# Patient Record
Sex: Male | Born: 1980
Health system: Southern US, Community
[De-identification: ages and names within clinical notes are randomized; demographics above are authoritative.]

## PROBLEM LIST (undated history)

## (undated) DIAGNOSIS — I10 Essential (primary) hypertension: Secondary | ICD-10-CM

## (undated) DIAGNOSIS — F1010 Alcohol abuse, uncomplicated: Secondary | ICD-10-CM

## (undated) DIAGNOSIS — F1410 Cocaine abuse, uncomplicated: Secondary | ICD-10-CM

## (undated) NOTE — *Deleted (*Deleted)
PROGRESS NOTE    Vincent Peters  ZOX:[id number] DOB: 04-Jan-1981 DOA: 06/03/2020 PCP: Patient, No Pcp Per (Confirm with patient/family/NH records and if not entered, this HAS to be entered at [facility] point of entry. "No PCP" if truly none.)   Chief Complaint  Patient presents with  . Alcohol Problem    Brief Narrative: (Start on day 1 of progress note - keep it brief and live) ***   Assessment & Plan:   Active Problems:   Delirium tremens (HCC)   SIRS (systemic inflammatory response syndrome) (HCC)   ***   DVT prophylaxis: (Lovenox/Heparin/SCD's/anticoagulated/None (if comfort care) Code Status: (Full/Partial - specify details) Family Communication: (Specify name, relationship & date discussed. NO "discussed with patient") Disposition:   Status is: Inpatient  {Inpatient:23812}  Dispo: The patient is from: {From:23814}              Anticipated d/c is to: {To:23815}              Anticipated d/c date is: {Days:23816}              Patient currently {Medically stable:23817}       Consultants:   ***  Procedures: (Don't include imaging studies which can be auto populated. Include things that cannot be auto populated i.e. Echo, Carotid and venous dopplers, Foley, Bipap, HD, tubes/drains, wound vac, central lines etc)  ***  Antimicrobials: (specify start and planned stop date. Auto populated tables are space occupying and do not give end dates)  ***    Subjective: ***  Objective: Vitals:   06/11/20 1305 06/11/20 2040 06/12/20 0654 06/12/20 1319  BP: 125/80 138/89 115/77 (!) 137/95  Pulse: 62 68 71 78  Resp: 18 18 18 11   Temp: 98.3 F (36.8 C) 98.9 F (37.2 C) 98.3 F (36.8 C) 98.5 F (36.9 C)  TempSrc: Oral Oral Oral Oral  SpO2: 100% 100% 100% 100%  Weight:   63.7 kg   Height:        Intake/Output Summary (Last 24 hours) at 06/12/2020 1326 Last data filed at 06/12/2020 1002 Gross per 24 hour  Intake -  Output 3176 ml  Net -3176 ml    Filed Weights   06/08/20 0500 06/11/20 0500 06/12/20 0654  Weight: 63.8 kg 64.7 kg 63.7 kg    Examination:  General exam: Appears calm and comfortable  Respiratory system: Clear to auscultation. Respiratory effort normal. Cardiovascular system: S1 & S2 heard, RRR. No JVD, murmurs, rubs, gallops or clicks. No pedal edema. Gastrointestinal system: Abdomen is nondistended, soft and nontender. No organomegaly or masses felt. Normal bowel sounds heard. Central nervous system: Alert and oriented. No focal neurological deficits. Extremities: Symmetric 5 x 5 power. Skin: No rashes, lesions or ulcers Psychiatry: Judgement and insight appear normal. Mood & affect appropriate.     Data Reviewed: I have personally reviewed following labs and imaging studies  CBC: Recent Labs  Lab 06/08/20 0246 06/09/20 1132 06/10/20 0544 06/11/20 0435 06/12/20 0506  WBC 8.2 8.6 9.4 9.6 12.0*  NEUTROABS 4.3 4.1 4.6 4.9 6.5  HGB 13.8 13.2 13.4 13.1 13.4  HCT 41.5 40.4 40.1 39.7 40.8  MCV 99.5 100.0 101.0* 101.3* 100.2*  PLT 125* 150 183 186 264    Basic Metabolic Panel: Recent Labs  Lab 06/08/20 0246 06/09/20 1132 06/10/20 0544 06/11/20 0435 06/12/20 0506  NA 138 136 137 136 135  K 3.4* 3.5 3.4* 4.3 3.7  CL 102 101 100 103 101  CO2 24 26 29 25 25   GLUCOSE  131* 106* 110* 104* 99  BUN 8 <5* <5* <5* 7  CREATININE 0.43* 0.48* 0.55* 0.47* 0.63  CALCIUM 8.9 8.4* 8.7* 9.1 9.1  MG 1.9 1.6* 1.8 1.9 1.8  PHOS 4.6 3.4 3.0 3.4 4.8*    GFR: Estimated Creatinine Clearance: 113.9 mL/min (by C-G formula based on SCr of 0.63 mg/dL).  Liver Function Tests: Recent Labs  Lab 06/08/20 0246 06/09/20 1132 06/10/20 0544 06/11/20 0435 06/12/20 0506  AST 212* 120* 96* 70* 78*  ALT 159* 142* 121* 108* 102*  ALKPHOS 89 80 85 83 76  BILITOT 1.0 0.6 0.5 0.7 0.6  PROT 7.2 6.5 6.6 6.9 7.1  ALBUMIN 3.2* 2.8* 2.9* 3.0* 3.1*    CBG: No results for input(s): GLUCAP in the last 168 hours.   Recent  Results (from the past 240 hour(s))  Respiratory Panel by RT PCR (Flu A&B, Covid) -     Status: None   Collection Time: 06/03/20  9:45 PM  Result Value Ref Range Status   SARS Coronavirus 2 by RT PCR NEGATIVE NEGATIVE Final    Comment: (NOTE) SARS-CoV-2 target nucleic acids are NOT DETECTED.  The SARS-CoV-2 RNA is generally detectable in upper respiratoy specimens during the acute phase of infection. The lowest concentration of SARS-CoV-2 viral copies this assay can detect is 131 copies/mL. A negative result does not preclude SARS-Cov-2 infection and should not be used as the sole basis for treatment or other patient management decisions. A negative result may occur with  improper specimen collection/handling, submission of specimen other than nasopharyngeal swab, presence of viral mutation(s) within the areas targeted by this assay, and inadequate number of viral copies (<131 copies/mL). A negative result must be combined with clinical observations, patient history, and epidemiological information. The expected result is Negative.  Fact Sheet for Patients:  https://www.moore.com/  Fact Sheet for Healthcare Providers:  https://www.young.biz/  This test is no t yet approved or cleared by the [place] FDA and  has been authorized for detection and/or diagnosis of SARS-CoV-2 by FDA under an Emergency Use Authorization (EUA). This EUA will remain  in effect (meaning this test can be used) for the duration of the COVID-19 declaration under Section 564(b)(1) of the Act, 21 U.S.C. section 360bbb-3(b)(1), unless the authorization is terminated or revoked sooner.     Influenza A by PCR NEGATIVE NEGATIVE Final   Influenza B by PCR NEGATIVE NEGATIVE Final    Comment: (NOTE) The Xpert Xpress SARS-CoV-2/FLU/RSV assay is intended as an aid in  the diagnosis of influenza from Nasopharyngeal swab specimens and  should not be used as a sole basis for  treatment. Nasal washings and  aspirates are unacceptable for Xpert Xpress SARS-CoV-2/FLU/RSV  testing.  Fact Sheet for Patients: https://www.moore.com/  Fact Sheet for Healthcare Providers: https://www.young.biz/  This test is not yet approved or cleared by the [place] FDA and  has been authorized for detection and/or diagnosis of SARS-CoV-2 by  FDA under an Emergency Use Authorization (EUA). This EUA will remain  in effect (meaning this test can be used) for the duration of the  Covid-19 declaration under Section 564(b)(1) of the Act, 21  U.S.C. section 360bbb-3(b)(1), unless the authorization is  terminated or revoked. Performed at [facility], [address] [place], [place] [address]   MRSA PCR Screening     Status: None   Collection Time: 06/04/20 10:59 PM   Specimen: Nasal Mucosa; Nasopharyngeal  Result Value Ref Range Status   MRSA by PCR NEGATIVE NEGATIVE  Final    Comment:        The GeneXpert MRSA Assay (FDA approved for NASAL specimens only), is one component of a comprehensive MRSA colonization surveillance program. It is not intended to diagnose MRSA infection nor to guide or monitor treatment for MRSA infections. Performed at [facility], [address] [place], [place] [address]   Culture, Urine     Status: Abnormal   Collection Time: 06/07/20  8:18 AM   Specimen: Urine, Clean Catch  Result Value Ref Range Status   Specimen Description   Final    URINE, CLEAN CATCH Performed at [facility], [address] [place], [place] [address]    Special Requests   Final    NONE Performed at [facility], [address] [place], [place] [address]    Culture (A)  Final    <10,000 COLONIES/mL INSIGNIFICANT GROWTH Performed at [facility], [address], [place], [place] [address]    Report Status 06/08/2020 FINAL  Final          Radiology Studies: No results found.      Scheduled Meds: . apixaban  5 mg Oral BID  . Chlorhexidine Gluconate Cloth  6 each Topical Daily  . folic acid  1 mg Oral Daily  . lactulose  20 g Oral Daily  . mouth rinse  15 mL Mouth Rinse BID  . metoprolol  100 mg Oral Daily  . multivitamin with minerals  1 tablet Oral Daily  . pantoprazole  40 mg Oral Daily  . PHENObarbital  65 mg Intravenous TID  . sacubitril-valsartan  1 tablet Oral BID  . thiamine  100 mg Oral Daily   Continuous Infusions:   LOS: 9 days    Time spent: ***    Ramiro Harvest, MD [facility]   To contact the attending provider between 7A-7P or the covering provider during after hours 7P-7A, please log into the web site [email or web address] and access using universal [place] password for that web site. If you do not have the password, please call the hospital operator.  06/12/2020, 1:26 PM

---

## 2008-08-30 ENCOUNTER — Emergency Department (HOSPITAL_COMMUNITY): Admission: EM | Admit: 2008-08-30 | Discharge: 2008-08-31 | Payer: Self-pay | Admitting: Emergency Medicine
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
## 2014-01-16 ENCOUNTER — Encounter (HOSPITAL_COMMUNITY): Payer: Self-pay | Admitting: Emergency Medicine

## 2014-01-16 DIAGNOSIS — R071 Chest pain on breathing: Secondary | ICD-10-CM | POA: Insufficient documentation

## 2014-01-16 DIAGNOSIS — R7989 Other specified abnormal findings of blood chemistry: Secondary | ICD-10-CM | POA: Insufficient documentation

## 2014-01-16 LAB — CBC WITH DIFFERENTIAL/PLATELET
BASOS ABS: 0 10*3/uL (ref 0.0–0.1)
BASOS PCT: 0 % (ref 0–1)
EOS PCT: 2 % (ref 0–5)
Eosinophils Absolute: 0.1 10*3/uL (ref 0.0–0.7)
HEMATOCRIT: 43.8 % (ref 39.0–52.0)
Hemoglobin: 15.5 g/dL (ref 13.0–17.0)
Lymphocytes Relative: 24 % (ref 12–46)
Lymphs Abs: 1.7 10*3/uL (ref 0.7–4.0)
MCH: 32.4 pg (ref 26.0–34.0)
MCHC: 35.4 g/dL (ref 30.0–36.0)
MCV: 91.4 fL (ref 78.0–100.0)
MONO ABS: 1.2 10*3/uL — AB (ref 0.1–1.0)
Monocytes Relative: 18 % — ABNORMAL HIGH (ref 3–12)
NEUTROS ABS: 3.8 10*3/uL (ref 1.7–7.7)
Neutrophils Relative %: 56 % (ref 43–77)
PLATELETS: 208 10*3/uL (ref 150–400)
RBC: 4.79 MIL/uL (ref 4.22–5.81)
RDW: 12 % (ref 11.5–15.5)
WBC: 6.9 10*3/uL (ref 4.0–10.5)

## 2014-01-16 NOTE — ED Notes (Signed)
Pt from home. C/o left lower flank pain x 15 days and urinary retention. Denies N/V/D. Pt self medicating with ETOH. ETOH on board at this time.

## 2014-01-17 ENCOUNTER — Emergency Department (HOSPITAL_COMMUNITY)
Admission: EM | Admit: 2014-01-17 | Discharge: 2014-01-17 | Disposition: A | Discharge status: Home / Self Care | Payer: Self-pay | Attending: Emergency Medicine | Admitting: Emergency Medicine

## 2014-01-17 ENCOUNTER — Emergency Department (HOSPITAL_COMMUNITY): Payer: Self-pay

## 2014-01-17 DIAGNOSIS — R945 Abnormal results of liver function studies: Secondary | ICD-10-CM

## 2014-01-17 DIAGNOSIS — R7989 Other specified abnormal findings of blood chemistry: Secondary | ICD-10-CM

## 2014-01-17 DIAGNOSIS — R0789 Other chest pain: Secondary | ICD-10-CM

## 2014-01-17 LAB — URINALYSIS, ROUTINE W REFLEX MICROSCOPIC
Bilirubin Urine: NEGATIVE
GLUCOSE, UA: NEGATIVE mg/dL
Ketones, ur: NEGATIVE mg/dL
LEUKOCYTES UA: NEGATIVE
Nitrite: NEGATIVE
PH: 5.5 (ref 5.0–8.0)
Protein, ur: 100 mg/dL — AB
SPECIFIC GRAVITY, URINE: 1.016 (ref 1.005–1.030)
Urobilinogen, UA: 0.2 mg/dL (ref 0.0–1.0)

## 2014-01-17 LAB — URINE MICROSCOPIC-ADD ON

## 2014-01-17 LAB — COMPREHENSIVE METABOLIC PANEL
ALBUMIN: 3.6 g/dL (ref 3.5–5.2)
ALT: 194 U/L — AB (ref 0–53)
AST: 247 U/L — AB (ref 0–37)
Alkaline Phosphatase: 81 U/L (ref 39–117)
BUN: 5 mg/dL — ABNORMAL LOW (ref 6–23)
CALCIUM: 9 mg/dL (ref 8.4–10.5)
CO2: 24 mEq/L (ref 19–32)
CREATININE: 0.5 mg/dL (ref 0.50–1.35)
Chloride: 98 mEq/L (ref 96–112)
GFR calc Af Amer: >90 mL/min (ref >90)
GFR calc non Af Amer: >90 mL/min (ref >90)
Glucose, Bld: 151 mg/dL — ABNORMAL HIGH (ref 70–99)
Potassium: 3.6 mEq/L — ABNORMAL LOW (ref 3.7–5.3)
SODIUM: 141 meq/L (ref 137–147)
Total Bilirubin: 0.3 mg/dL (ref 0.3–1.2)
Total Protein: 9.1 g/dL — ABNORMAL HIGH (ref 6.0–8.3)

## 2014-01-17 LAB — LIPASE, BLOOD: Lipase: 53 U/L (ref 11–59)

## 2014-01-17 MED ORDER — KETOROLAC TROMETHAMINE 60 MG/2ML IM SOLN
60.0000 mg | Freq: Once | INTRAMUSCULAR | Status: AC
  Administered 2014-01-17: 60 mg via INTRAMUSCULAR
  Filled 2014-01-17: qty 2

## 2014-01-17 MED ORDER — IBUPROFEN 600 MG PO TABS: 600.0000 mg | ORAL_TABLET | Freq: Four times a day (QID) | ORAL | Status: DC | PRN

## 2014-01-17 NOTE — ED Notes (Signed)
Pt discharged home with all belongings, pt alert, oriented, and ambulatory upon discharge. Pt refused wheelchair assistance. 1 new RX prescribed, pt verbalizes understanding of discharge instructions, pt drove self home, no narcotics given in ED

## 2014-01-17 NOTE — Discharge Instructions (Signed)

## 2014-01-17 NOTE — ED Provider Notes (Signed)
CSN: [id number]     Arrival date & time 01/16/14  2233 History   First MD Initiated Contact with Patient 01/17/14 0126     Chief Complaint  Patient presents with  . Abdominal Pain  . Nausea     (Consider location/radiation/quality/duration/timing/severity/associated sxs/prior Treatment) HPI Patient presents with left sided chest and upper abdomen pain starting around 2 PM this afternoon. Patient states he works [employer]. The pain is now improved. It is worse with palpation and movement. He denied any direct,. He has no shortness of breath. Patient admits to drinking alcohol excessively recently. History reviewed. No pertinent past medical history. History reviewed. No pertinent past surgical history. History reviewed. No pertinent family history. History  Substance Use Topics  . Smoking status: Never Smoker   . Smokeless tobacco: Not on file  . Alcohol Use: Yes    Review of Systems  Constitutional: Negative for fever and chills.  Respiratory: Negative for shortness of breath.   Cardiovascular: Positive for chest pain. Negative for palpitations and leg swelling.  Gastrointestinal: Positive for abdominal pain. Negative for nausea, vomiting, diarrhea and constipation.  Genitourinary: Negative for dysuria and flank pain.  Musculoskeletal: Negative for back pain, neck pain and neck stiffness.  Skin: Negative for rash and wound.  Neurological: Negative for dizziness, weakness, light-headedness, numbness and headaches.  All other systems reviewed and are negative.     Allergies  Review of patient's allergies indicates no known allergies.  Home Medications   Prior to Admission medications   Not on File   BP 156/94  Pulse 90  Temp(Src) 98.2 F (36.8 C) (Oral)  Resp 26  SpO2 94% Physical Exam  Nursing note and vitals reviewed. Constitutional: He is oriented to person, place, and time. He appears well-developed and well-nourished. No distress.  HENT:  Head:  Normocephalic and atraumatic.  Mouth/Throat: Oropharynx is clear and moist.  Eyes: EOM are normal. Pupils are equal, round, and reactive to light.  Neck: Normal range of motion. Neck supple.  Cardiovascular: Normal rate and regular rhythm.   Pulmonary/Chest: Effort normal and breath sounds normal. No respiratory distress. He has no wheezes. He has no rales. He exhibits tenderness (left lateral chest tenderness with palpation. No crepitance or deformity).  Abdominal: Soft. Bowel sounds are normal. He exhibits no distension and no mass. There is no tenderness. There is no rebound and no guarding.  Musculoskeletal: Normal range of motion. He exhibits no edema and no tenderness.  No CVA tenderness bilaterally.  Neurological: He is alert and oriented to person, place, and time.  Skin: Skin is warm and dry. No rash noted. No erythema.  Psychiatric: He has a normal mood and affect. His behavior is normal.    ED Course  Procedures (including critical care time) Labs Review Labs Reviewed  CBC WITH DIFFERENTIAL - Abnormal; Notable for the following:    Monocytes Relative 18 (*)    Monocytes Absolute 1.2 (*)    All other components within normal limits  COMPREHENSIVE METABOLIC PANEL - Abnormal; Notable for the following:    Potassium 3.6 (*)    Glucose, Bld 151 (*)    BUN 5 (*)    Total Protein 9.1 (*)    AST 247 (*)    ALT 194 (*)    All other components within normal limits  LIPASE, BLOOD  URINALYSIS, ROUTINE W REFLEX MICROSCOPIC    Imaging Review No results found.   EKG Interpretation   Date/Time:  Tuesday January 17 2014 02:25:37 EDT Ventricular Rate:  87 PR Interval:  138 QRS Duration: 88 QT Interval:  368 QTC Calculation: 443 R Axis:   67 Text Interpretation:  Sinus rhythm Borderline repolarization abnormality  Confirmed by Ranae PalmsYELVERTON  MD, Asani Deniston ([id number]) on 01/17/2014 6:47:10 AM      MDM   Final diagnoses:  None   Patient's symptoms have improved with Toradol. Exam is  consistent with chest wall pain which is likely musculoskeletal in nature. Patient is aware of his elevated liver function tests and the need to decrease his alcohol intake. I've advised him to followup with a gastroenterologist to have these rechecked. Return precautions have been given.    Loren Raceravid Rito Lecomte, MD 01/17/14 [phone]

## 2014-01-17 NOTE — ED Notes (Signed)
Pt states he is here for his job. When asked to clarify pt states "it got too hot today, I couldn't work." Pt denies problems with urinating. Pt does report Left side pain and generalized abd pain, pt states "I think my belly hurts because I drink too much." Pt reports drinking beer for 20 years, approx 12 beers a day, pt last drank last night at 2000, pt admits to drinking 12 beers prior to arrival here. Pt states "everything makes my pain better."

## 2014-01-17 NOTE — ED Notes (Signed)
Patient transported to X-ray 

## 2014-01-17 NOTE — ED Notes (Signed)
MD Yelverton at bedside. 

## 2014-02-08 ENCOUNTER — Emergency Department (HOSPITAL_COMMUNITY)
Admission: EM | Admit: 2014-02-08 | Discharge: 2014-02-09 | Disposition: A | Discharge status: Home / Self Care | Payer: Self-pay | Attending: Emergency Medicine | Admitting: Emergency Medicine

## 2014-02-08 ENCOUNTER — Encounter (HOSPITAL_COMMUNITY): Payer: Self-pay | Admitting: Emergency Medicine

## 2014-02-08 DIAGNOSIS — M6281 Muscle weakness (generalized): Secondary | ICD-10-CM | POA: Insufficient documentation

## 2014-02-08 DIAGNOSIS — R531 Weakness: Secondary | ICD-10-CM

## 2014-02-08 DIAGNOSIS — F17200 Nicotine dependence, unspecified, uncomplicated: Secondary | ICD-10-CM | POA: Insufficient documentation

## 2014-02-08 DIAGNOSIS — F1010 Alcohol abuse, uncomplicated: Secondary | ICD-10-CM | POA: Insufficient documentation

## 2014-02-08 HISTORY — DX: Alcohol abuse, uncomplicated: F10.10

## 2014-02-08 HISTORY — DX: Cocaine abuse, uncomplicated: F14.10

## 2014-02-08 LAB — CBC WITH DIFFERENTIAL/PLATELET
BASOS ABS: 0 10*3/uL (ref 0.0–0.1)
BASOS PCT: 0 % (ref 0–1)
Eosinophils Absolute: 0.1 10*3/uL (ref 0.0–0.7)
Eosinophils Relative: 1 % (ref 0–5)
HCT: 42.7 % (ref 39.0–52.0)
Hemoglobin: 15.2 g/dL (ref 13.0–17.0)
Lymphocytes Relative: 7 % — ABNORMAL LOW (ref 12–46)
Lymphs Abs: 0.7 10*3/uL (ref 0.7–4.0)
MCH: 32.7 pg (ref 26.0–34.0)
MCHC: 35.6 g/dL (ref 30.0–36.0)
MCV: 91.8 fL (ref 78.0–100.0)
Monocytes Absolute: 1.2 10*3/uL — ABNORMAL HIGH (ref 0.1–1.0)
Monocytes Relative: 13 % — ABNORMAL HIGH (ref 3–12)
NEUTROS ABS: 7.6 10*3/uL (ref 1.7–7.7)
NEUTROS PCT: 79 % — AB (ref 43–77)
Platelets: 94 10*3/uL — ABNORMAL LOW (ref 150–400)
RBC: 4.65 MIL/uL (ref 4.22–5.81)
RDW: 12.3 % (ref 11.5–15.5)
WBC: 9.6 10*3/uL (ref 4.0–10.5)

## 2014-02-08 LAB — BASIC METABOLIC PANEL
Anion gap: 19 — ABNORMAL HIGH (ref 5–15)
BUN: 13 mg/dL (ref 6–23)
CHLORIDE: 95 meq/L — AB (ref 96–112)
CO2: 22 mEq/L (ref 19–32)
Calcium: 9.9 mg/dL (ref 8.4–10.5)
Creatinine, Ser: 0.48 mg/dL — ABNORMAL LOW (ref 0.50–1.35)
GFR calc non Af Amer: >90 mL/min (ref >90)
Glucose, Bld: 115 mg/dL — ABNORMAL HIGH (ref 70–99)
POTASSIUM: 4 meq/L (ref 3.7–5.3)
Sodium: 136 mEq/L — ABNORMAL LOW (ref 137–147)

## 2014-02-08 LAB — RAPID URINE DRUG SCREEN, HOSP PERFORMED
Amphetamines: NOT DETECTED
Barbiturates: NOT DETECTED
Benzodiazepines: NOT DETECTED
Cocaine: NOT DETECTED
OPIATES: NOT DETECTED
TETRAHYDROCANNABINOL: NOT DETECTED

## 2014-02-08 LAB — I-STAT TROPONIN, ED: Troponin i, poc: 0.01 ng/mL (ref 0.00–0.08)

## 2014-02-08 LAB — LIPASE, BLOOD: Lipase: 52 U/L (ref 11–59)

## 2014-02-08 LAB — ETHANOL

## 2014-02-08 NOTE — Discharge Instructions (Signed)
Trastorno por abuso de alcohol  (Alcohol Use Disorder) El trastorno por abuso de alcohol es un trastorno mental. No se trata de un incidente nico en el que se bebe en abundancia. Este trastorno se debe al consumo incontrolable del alcohol a lo largo del [place], que causa dificultades en una o ms reas de la vida diaria. Al consumir en exceso, las personas que sufren este trastorno estn en riesgo de daarse a s mismos y a Dentist. Tambin puede causar otros trastornos [place], como trastornos del [place] de nimo y trastornos de [place], y problemas fsicos graves. Las personas que sufren trastornos por consumo de alcohol, generalmente abusan de otras sustancias.  Los trastornos por consumo de alcohol son frecuentes y se han generalizado. Algunas personas beben alcohol para poder enfrentarse o escapar a las situaciones negativas de la vida. Otros beben para Government social research officer crnico o los sntomas de una enfermedad mental. Las personas con historia familiar de trastornos por consumo de alcohol tienen ms riesgo de perder el control y de consumir alcohol en exceso.  SNTOMAS  Los signos y sntomas pueden ser:   Consumo dealcohol engrandes cantidades o durante perodos ms prolongados que lo previsto.  Mltiples intentos fallidos de dejar de bebero controlar el consumo de alcohol.   Emplear gran cantidad de tiempo para obtener, consumir o recuperarse de los efectos del alcohol (resaca).  Un deseo intenso o necesidad de consumir alcohol (abstinencia).   Continuar consumiendo alcohol a pesar de los Murphy Oil, en la escuela o en el hogar debido al consumo.   Continuar consumiendo a Scientist, water quality de American Standard Companies de relacin debido al consumo.  Continuar consumiendo an en situaciones que impliquen un peligro fsico, como al conducir un vehculo.  Continuar consumiendo a pesar de saber que un problema fsico o psicolgico probablemente se relaciones con el consumo de alcohol. Los  problemas fsicos relacionados con el consumo de alcohol pueden producirse en el cerebro, el corazn, el hgado, el estmago y los intestinos. Los problemas psicolgicos relacionados con el consumo de alcohol incluyen intoxicacin, depresin, ansiedad, psicosis, delirio y Photographer.   La necesidad de aumentar la cantidad de alcohol para Wellsite geologist deseado, o una disminucin del efecto al consumir la misma cantidad de alcohol (tolerancia).  Sndrome de abstinencia al reducir o Photographer consumo, o tener que consumir alcohol para reducir o Multimedia programmer sndrome de abstinencia. Los sntomas de la abstinencia incluyen:  Frecuencia cardaca acelerada.  Temblores en las manos.  Dificultad para dormir.  Nuseas.  Vmitos.  Alucinaciones.  Agitacin.  Convulsiones. DIAGNSTICO  El mdico es el encargado de diagnosticar el trastorno por consumo de alcohol. Comenzar hacindole tres o cuatro preguntas para evaluar si consume alcohol en forma excesiva o si representa un problema. Para confirmar el diagnstico de trastorno por consumo de alcohol, deben estar presentes al [employer] sntomas (vea SNTOMAS) durante un perodo de 12 meses. La gravedad del trastorno depende del nmero de sntomas:   Leve--dos o tres.  Moderado--cuatro o cinco.  Grave--seis o ms. El mdico podr Sports coach un examen fsico o Boston Scientific los [place] de las pruebas de laboratorio para ver si usted tiene problemas fsicos como resultado del consumo de alcohol. Podr derivarlo a Administrator, sports de la salud mental para que realice una evaluacin.  TRATAMIENTO  Algunas personas podrn reducir el consumo a niveles en los que haya un bajo riesgo. Otras personas deben dejar de beber. Cuando sea necesario, podr recibir [employer] Global profesionales de la  salud mental, capacitados en el tratamiento del abuso de sustancias. El mdico podr informarlo sobre la gravedad de su trastorno por consumo de alcohol y decidir qu tipo de  tratamiento necesita. Las formas de tratamiento disponibles son:   Desintoxicacin. La desintoxicacin consiste en el uso de medicamentos recetados para evitar el sndrome de abstinencia durante la primera semana en la que se deja de consumir. Esto es importante para las personas con historia de sndrome de abstinencia y para los que consumen en exceso, quienes probablemente sufran el sndrome de abstinencia. El sndrome de abstinencia puede ser peligroso y en algunos casos puede causar la Venicemuerte. La desintoxicacin generalmente se realiza internado en un hospital o en una institucin para pacientes que abusan de sustancias.  Asesoramiento psicolgico o psicoterapia. La psicoterapia la realizan terapeutas especializados en el tratamiento de pacientes que abusan de sustancias. Se evalan los motivos que llevan a consumir alcohol, y los modos para Furniture conservator/restorerevitar volver a consumir. Los PepsiCoobjetivos de la psicoterapia son ayudar a que las personas con trastornos por consumo de alcohol encuentren actividades saludables y Standard Pacificmodos de Radio broadcast assistantenfrentar el estrs de la vida, a identificar y Programmer, applicationsevitar los disparadores que originan el consumo de alcohol y a Scientist, physiologicalcontrolar la abstinencia, que puede originar una recada.  Medicamentos.Hay diferentes medicamentos que pueden ayudar a tratar el trastorno por consumo de alcohol a travs de los siguientes efectos:  Controlan la ansiedad por consumir.  Disminuyen la respuesta de recompensa positiva que se siente al consumir.  Causan una reaccin fsica desagradable cuando se consume alcohol (terapia por aversin).  Grupos de apoyo. Los grupos de apoyo son dirigidos por personas que han dejado de consumir. Ellos brindan apoyo emocional, consejo y [place]. Estas formas de tratamiento generalmente se combinan. Algunas personas se benefician con el tratamiento combinado intensivo que se ofrece en algunos centros de tratamiento especializados en el abuso de sustancias. Existen programas para pacientes  hospitalizados o ambulatorios.  Document Released: 07/21/2005 Document Revised: 03/23/2013 [facility] Patient Information 2015 [place], [place]. This information is not intended to replace advice given to you by your health care provider. Make sure you discuss any questions you have with your health care provider.  Debilidad  (Weakness)  La debilidad es la falta de energa. Se puede sentir en todo el cuerpo (generalizada) o en una parte especfica del cuerpo (focalizada).Verner Chol. Algunas causas de la debilidad pueden ser graves. Es posible que necesite una evaluacin mdica ms profunda, especialmente si usted es anciano o tiene historia de inmunosupresin (como quimioterapia o VIH), enfermedad renal, enfermedad cardiaca o diabetes. CAUSAS  La debilidad puede deberse a muchas afecciones diferentes:   Infecciones.  Cansancio fsico extremo.  Hemorragia interna u otras prdidas de sangre que causa un dficit de glbulos rojos (anemia).  Deshidratacin. Esta causa es ms frecuente en personas mayores.  Efectos secundarios o anormalidades electrolticas por medicamentos, como analgsicos o sedantes.  Estrs emocional, ansiedad o depresin.  Problemas de circulacin, especialmente enfermedad arterial perifrica grave.  Enfermedades cardacas, como la fibrilacin auricular rpida, la bradicardia o la insuficiencia cardaca.  Los trastornos del [address]istema nervioso, como el sndrome de [place]ill[place], esclerosis mltiple o ictus. DIAGNSTICO  Para hallar la causa de la debilidad, el mdico le har una historia clnica y un examen fsico. Si es necesario, le indicarn pruebas de laboratorio o Recruitment consultantradiografas.  TRATAMIENTO  El tratamiento de la debilidad depende de la causa de sus sntomas y puede Electrical engineervariar mucho.  INSTRUCCIONES PARA EL CUIDADO EN EL HOGAR   Descanse todo lo que sea necesario.  Consuma una dieta bien balanceada.  Trate de Materials engineer [employer].  Tome slo medicamentos de venta  libre o recetados, segn las indicaciones del mdico. SOLICITE ATENCIN MDICA SI:   La debilidad parece empeorar o se extiende a otras partes del cuerpo.  Siente nuevos dolores o dolencias. SOLICITE ATENCIN MDICA DE INMEDIATO SI:   No puede realizar sus actividades normales diarias, como vestirse y alimentarse.  No puede subir y Architectural technologist o se siente agotado cuando lo hace.  Tiene dificultad para respirar o le duele pecho.  Tiene dificultad para mover partes del cuerpo.  Siente debilidad en una sola zona del cuerpo o solo en un lado del cuerpo.  Tiene fiebre.  Tiene problemas para hablar o tragar.  No puede controlar la vejiga o los movimientos intestinales.  La materia fecal o el vmito son negros o Investment banker, operational. ASEGRESE DE QUE:   Comprende estas instrucciones.  Controlar su enfermedad.  Solicitar ayuda de inmediato si no mejora o si empeora. Document Released: 09/01/2006 Document Revised: 01/20/2012 [facility] Patient Information 2015 [place], [place]. This information is not intended to replace advice given to you by your health care provider. Make sure you discuss any questions you have with your health care provider.    Emergency Department Resource Guide 1) Find a Doctor and Pay Out of Pocket Although you won't have to find out who is covered by your insurance plan, it is a good idea to ask around and get recommendations. You will then need to call the office and see if the doctor you have chosen will accept you as a new patient and what types of options they offer for patients who are self-pay. Some doctors offer discounts or will set up payment plans for their patients who do not have insurance, but you will need to ask so you aren't surprised when you get to your appointment.  2) Contact Your Local Health Department Not all health departments have doctors that can see patients for sick visits, but many do, so it is worth a call to see if yours does. If  you don't know where your local health department is, you can check in your phone book. The CDC also has a tool to help you locate your state's health department, and many state websites also have listings of all of their local health departments.  3) Find a Walk-in Clinic If your illness is not likely to be very severe or complicated, you may want to try a walk in clinic. These are popping up all over the country in pharmacies, drugstores, and shopping centers. They're usually staffed by nurse practitioners or physician assistants that have been trained to treat common illnesses and complaints. They're usually fairly quick and inexpensive. However, if you have serious medical issues or chronic medical problems, these are probably not your best option.  No Primary Care Doctor: - Call Health Connect at  [phone] - they can help you locate a primary care doctor that  accepts your insurance, provides certain services, etc. - Physician Referral Service- [phone]  Chronic Pain Problems: Organization         Address  Phone   Notes  [facility]  [phone] Patients need to be referred by their primary care doctor.   Medication Assistance: Organization         Address  Phone   Notes  [facility] Medication [facility] [address] [place], [place] [address] [phone] --Must be  a resident of [facility] -- Must have NO insurance coverage whatsoever (no Medicaid/ Medicare, etc.) -- The pt. MUST have a primary care doctor that directs their care regularly and follows them in the community   Med[facility]ist  [phone]   [employer]  [phone]    Agencies that provide inexpensive medical care: Organization         Address  Phone   Notes  [facility]  [phone]   [facility]    [phone]   [facility] [address] [place], [place] [address] [phone]   [facility] [address] [place], [place] [phone]   [facility]anned Parenthood    [phone]   [facility]    [phone]   Community Health and [facility]  [address], [place] Phone:  [phone], Fax:  [phone] Hours of Operation:  9 am - 6 pm, M-F.  Also accepts Medicaid/Medicare and self-pay.  [facility]  [address], [place] Phone: [phone], Fax: [phone]. Hours of Operation:  8:30 am - 5:30 pm, M-F.  Also accepts Medicaid and self-pay.  [facility] [place] [address], [place] Phone: [phone]   [facility] [address] Natasha Bence [place], [place] [phone], Ext. [phone] Mondays & Thursdays: 7-9 AM.  First 15 patients are seen on a first come, first serve basis.    Medicaid-accepting [facility] Providers:  Organization         Address  Phone   Notes  [facility] [address], [place] [phone] Also accepts self-pay patients.  [facility] [address] Laurell Josephs [place], [place]  [phone]   [facility] [address], [place] [phone]   [facility] [address] [place] [phone]   Renaye Rakers [address], [place]   [phone] Only accepts [place] Access [place] patients after they have their name applied to their card.   Self-Pay (no insurance) in [facility]:  Organization         Address  Phone   Notes  Sickle Cell Patients, [facility] [address] [place], [place] [phone]   [facility] [address] Ferrer [place], [place] [phone]   [facility] [place]  [address],  [phone]   [facility]/Dr. Osei-Bonsu  [address] [place] or [address], [place] [phone]  Phone number for both [place] and [place] locations is the same.  Urgent Medical and [facility] [address] [place] [phone]   [facility] [address], [place] or [address] [phone]   [facility] [address] [place] [phone], phone; [phone], fax Sees patients 1st and 3rd Saturday of every month.  Must not qualify for public or private insurance (i.e. Medicaid, Medicare, [place] Health Choice, Veterans' Benefits)  Household income should be no more than 200% of the poverty level The clinic cannot treat you if you are pregnant or think you are pregnant  Sexually transmitted diseases are not treated at the clinic.    Dental Care: Organization  Address  Phone  Notes  [facility] [address], [place] [phone] Accepts children up to age 32 who are enrolled in [place]icaid or [place] Health Choice; pregnant women with a Medicaid card; and children who have applied for Medicaid or [place] Health Choice, but were declined, whose parents can pay a reduced fee at time of service.  [facility] [place]  [address], [place] [phone] Accepts children up to age 32 who are enrolled in [place]icaid or [place] Health Choice; pregnant women with a Medicaid card; and children who have applied for Medicaid or [place] Health Choice, but were declined, whose parents can pay a reduced fee at time of service.  [facility]  [address], [place] [phone] Patients are seen by appointment only. Walk-ins are not accepted. [facility] will see patients 32 years of age and older. Monday - Tuesday (8am-5pm) Most Wednesdays (8:30-5pm) $30 per visit, cash only  [facility]  [address], [facility] [place] [phone] Patients are seen by appointment  only. Walk-ins are not accepted. [facility] will see patients 718 years of age and older. One Wednesday Evening (Monthly: Volunteer Based).  $30 per visit, cash only  [employer] [facility] [facility]  [phone] for adults; Children under age 594, call Graduate Pediatric Dentistry at [phone]. Children aged 714-14, please call [phone] to request a pediatric application.  Dental services are provided in all areas of dental care including fillings, crowns and bridges, complete and partial dentures, implants, gum treatment, root canals, and extractions. Preventive care is also provided. Treatment is provided to both adults and children. Patients are selected via a lottery and there is often a waiting list.   [facility] [address], [place]  [phone] [email or web address]   [facility] [address], [place], [place] [phone], Ext. [phone] Second and Fourth Thursday of each month, opens at 6:30 AM; Clinic ends at 9 AM.  Patients are seen on a first-come first-served basis, and a limited number are seen during each clinic.   [facility]  [address], [place], [place] [phone]   Eligibility Requirements You must have lived in [place], [place], or [place] counties for at least the last three months.   You cannot be eligible for state or federal sponsored National Cityhealthcare insurance, including CIGNAVeterans Administration, [place]icaid, or Harrah's EntertainmentMedicare.   You generally cannot be eligible for healthcare insurance through your employer.    How to apply: Eligibility screenings are held every Tuesday and Wednesday afternoon from 1:00 pm until 4:00 pm. You do not need an appointment for the interview!  [facility] [address], [place], [place] [phone]   [facility]  [phone]   [facility]  [phone]   [facility]  [phone]    Behavioral Health  Resources in the Community: Intensive Outpatient Programs Organization         Address  Phone  Notes  [facility] [address], [place], [place] [phone]   [facility] [address], [place], [place] [phone]   [facility] [address], [place], [place]  [phone]   [facility] [address],  [place], [place] [phone] or [phone]   Substance Abuse Resources  Organization         Address  Phone  Notes  Alcohol and Drug Services  [phone]   [facility]  [phone]   [facility] [place]  [phone]   Floydene Flock  [phone]   Residential & Outpatient Substance Abuse Program  [phone]   Psychological Services Organization         Address  Phone  Notes  [facility]  [phone]   [facility]  [phone]   [facility] Mental Health [address], [place] [phone] or [phone]    Mobile Crisis Teams Organization         Address  Phone  Notes  [facility], Mobile Crisis Care Unit  [phone]   [facility]  [address] [place], [place] [phone]   Doristine Locks [address] [place] [phone]    Self-Help/Support Groups Organization         Address  Phone             Notes  [facility] of [place] - variety of support groups  [phone]- [phone] Call for more information  Narcotics Anonymous (NA), [facility] [address], [employer] [place]  2 meetings at this location   Statistician         Address  Phone  Notes  [facility] [address],    [place]  [phone]   [facility]  [address], [place] [address], [place], [place] [phone]   [facility] [address] [place], [place] [phone] Admissions: 8am-3pm M-F  [facility] Substance Abuse  Treatment Center [address],    [place], [place] [phone]   [facility] [address] [place], [place], [place] [phone]   [facility] [address]  [place], [place] [phone]   [facility] - Intensive Outpatient [address] Laurell Joseph[address], [place], [place] [phone]   [facility] ([facility] [address]  [place], [place] [phone] or [phone]   [facility] ([facility]) [address], [place], [place] [phone] Accepts Medicaid  [place] [address],  [place] [phone] Substance Abuse/Addiction Treatment   [facility] Organization         Address  Phone  Notes  [facility]  [phone]   Angie Fava, PhD [address] Ervin Knack [place], [place]   [phone] or [phone]   [facility] Behavioral   [address] [place], [place] [phone]   [facility] [address], [place], [place] [phone] Insurance/Medicaid/sponsorship through [facility] and Families [address]                                    [place], [place] [phone] Therapy/tele-psych/case  [facility] [address], [place] [phone]    Dr. Lolly Mustache  [phone]   [facility] [place]  [employer] [facility] Dept. 1) [address] [place] 2) [address], [place] 3)  [address], [place] [phone]  [phone]   [facility] Child Abuse Hotline [phone])285-4467 or [phone] (After Hours)

## 2014-02-08 NOTE — ED Notes (Signed)
Pt arrives via EMS from home. C/o weakness and headache x2 days. EMS found BP sys 190. 12 lead showed P wave and T wave abnormalities. Pt also c/o decreased appetite and not being able to work because of weakness. CBG 190, HR 94 99% RA.  Called spanish interpreter, pt c/o of weakness and headache x2weeks. States that he has not worked in 2 weeks because of this. States that he has a headache 5/10. States he only has hx of alcohol abuse. States he has only drank 1 beer today but normally drinks >24 beers daily. States he smokes 2-3 cigarettes a day and uses cocaine occassionaly.

## 2014-02-08 NOTE — ED Notes (Signed)
EKG handed to Dr. Delo 

## 2014-02-08 NOTE — ED Provider Notes (Signed)
CSN: [id number]     Arrival date & time 02/08/14  2151 History   First MD Initiated Contact with Patient 02/08/14 2200     Chief Complaint  Patient presents with  . Weakness  . Hypertension     (Consider location/radiation/quality/duration/timing/severity/associated sxs/prior Treatment) HPI Comments: Patient is a 33 year old Hispanic male who presents for evaluation of weakness. He states that he has been having no energy for the past several days and has felt too weak to go to work. He reports headache but denies other specific symptoms. He denies any fevers or chills.  EMS was called and the patient was brought here. His blood pressure was apparently 190 systolic, however this is improved upon arrival to the ER.  He admits to me that he drinks as many as 15 beers per day. He also smokes cigarettes.  Patient is a 33 y.o. male presenting with weakness. The history is provided by the patient.  Weakness This is a new problem. Episode onset: Several days ago. The problem occurs constantly. The problem has been gradually worsening. Nothing aggravates the symptoms. Nothing relieves the symptoms. He has tried nothing for the symptoms. The treatment provided no relief.    Past Medical History  Diagnosis Date  . Alcohol abuse   . Cocaine abuse    History reviewed. No pertinent past surgical history. No family history on file. History  Substance Use Topics  . Smoking status: Current Every Day Smoker -- 0.25 packs/day    Types: Cigarettes  . Smokeless tobacco: Never Used  . Alcohol Use: 100.8 oz/week    168 Cans of beer per week    Review of Systems  Neurological: Positive for weakness.  All other systems reviewed and are negative.     Allergies  Review of patient's allergies indicates no known allergies.  Home Medications   Prior to Admission medications   Not on File   BP 158/95  Pulse 92  Temp(Src) 98.6 F (37 C) (Oral)  Resp 21  Ht 5\' 8"  (1.727 m)  Wt 166 lb  (75.297 kg)  BMI 25.25 kg/m2  SpO2 97% Physical Exam  Nursing note and vitals reviewed. Constitutional: He is oriented to person, place, and time. He appears well-developed and well-nourished. No distress.  HENT:  Head: Normocephalic and atraumatic.  Mouth/Throat: Oropharynx is clear and moist.  Eyes: EOM are normal. Pupils are equal, round, and reactive to light.  Neck: Normal range of motion. Neck supple.  Cardiovascular: Normal rate, regular rhythm and normal heart sounds.   No murmur heard. Pulmonary/Chest: Effort normal and breath sounds normal. No respiratory distress. He has no wheezes.  Abdominal: Soft. Bowel sounds are normal. He exhibits no distension. There is no tenderness.  Musculoskeletal: Normal range of motion. He exhibits no edema.  Neurological: He is alert and oriented to person, place, and time. No cranial nerve deficit. He exhibits normal muscle tone. Coordination normal.  Skin: Skin is warm and dry. He is not diaphoretic.    ED Course  Procedures (including critical care time) Labs Review Labs Reviewed  CBC WITH DIFFERENTIAL  BASIC METABOLIC PANEL  LIPASE, BLOOD  ETHANOL  URINE RAPID DRUG SCREEN (HOSP PERFORMED)  I-STAT TROPOININ, ED    Imaging Review No results found.   EKG Interpretation   Date/Time:  Wednesday February 08 2014 21:52:46 EDT Ventricular Rate:  90 PR Interval:  185 QRS Duration: 82 QT Interval:  360 QTC Calculation: 440 R Axis:   69 Text Interpretation:  Sinus rhythm Borderline ST  depression, diffuse leads  Confirmed by DELOS  MD, Konnor Jorden ([id number]) on 02/08/2014 10:09:10 PM      MDM   Final diagnoses:  None    Patient presents here with complaints of weakness and generalized malaise for the past several days. His physical examination is unremarkable and workup reveals no obvious cause. He does admit to consuming a significant quantity of alcohol daily. I am uncertain as to whether this is the reason, however nothing else appears  emergent. At this point I feel as though he is appropriate for discharge.    Geoffery Lyonsouglas Adyen Bifulco, MD 02/08/14 [id number]

## 2014-02-10 ENCOUNTER — Inpatient Hospital Stay (HOSPITAL_COMMUNITY)
Admission: EM | Admit: 2014-02-10 | Discharge: 2014-02-13 | DRG: 897 | Disposition: A | Discharge status: Home / Self Care | Payer: Self-pay | Attending: Internal Medicine | Admitting: Internal Medicine

## 2014-02-10 ENCOUNTER — Encounter (HOSPITAL_COMMUNITY): Payer: Self-pay | Admitting: Emergency Medicine

## 2014-02-10 DIAGNOSIS — R945 Abnormal results of liver function studies: Secondary | ICD-10-CM

## 2014-02-10 DIAGNOSIS — F1020 Alcohol dependence, uncomplicated: Secondary | ICD-10-CM | POA: Diagnosis present

## 2014-02-10 DIAGNOSIS — K7010 Alcoholic hepatitis without ascites: Secondary | ICD-10-CM | POA: Diagnosis present

## 2014-02-10 DIAGNOSIS — D696 Thrombocytopenia, unspecified: Secondary | ICD-10-CM

## 2014-02-10 DIAGNOSIS — F10930 Alcohol use, unspecified with withdrawal, uncomplicated: Secondary | ICD-10-CM

## 2014-02-10 DIAGNOSIS — F10931 Alcohol use, unspecified with withdrawal delirium: Principal | ICD-10-CM

## 2014-02-10 DIAGNOSIS — F10231 Alcohol dependence with withdrawal delirium: Secondary | ICD-10-CM

## 2014-02-10 DIAGNOSIS — R7989 Other specified abnormal findings of blood chemistry: Secondary | ICD-10-CM

## 2014-02-10 DIAGNOSIS — E876 Hypokalemia: Secondary | ICD-10-CM

## 2014-02-10 DIAGNOSIS — F10239 Alcohol dependence with withdrawal, unspecified: Secondary | ICD-10-CM | POA: Diagnosis present

## 2014-02-10 DIAGNOSIS — F10939 Alcohol use, unspecified with withdrawal, unspecified: Secondary | ICD-10-CM

## 2014-02-10 DIAGNOSIS — E860 Dehydration: Secondary | ICD-10-CM | POA: Diagnosis present

## 2014-02-10 DIAGNOSIS — F10230 Alcohol dependence with withdrawal, uncomplicated: Secondary | ICD-10-CM

## 2014-02-10 LAB — COMPREHENSIVE METABOLIC PANEL
ALT: 473 U/L — ABNORMAL HIGH (ref 0–53)
AST: 586 U/L — AB (ref 0–37)
Albumin: 3.9 g/dL (ref 3.5–5.2)
Alkaline Phosphatase: 70 U/L (ref 39–117)
Anion gap: 17 — ABNORMAL HIGH (ref 5–15)
BUN: 11 mg/dL (ref 6–23)
CALCIUM: 10 mg/dL (ref 8.4–10.5)
CHLORIDE: 98 meq/L (ref 96–112)
CO2: 25 mEq/L (ref 19–32)
CREATININE: 0.66 mg/dL (ref 0.50–1.35)
GFR calc non Af Amer: >90 mL/min (ref >90)
GLUCOSE: 101 mg/dL — AB (ref 70–99)
Potassium: 3.3 mEq/L — ABNORMAL LOW (ref 3.7–5.3)
Sodium: 140 mEq/L (ref 137–147)
Total Bilirubin: 1.8 mg/dL — ABNORMAL HIGH (ref 0.3–1.2)
Total Protein: 8.4 g/dL — ABNORMAL HIGH (ref 6.0–8.3)

## 2014-02-10 LAB — BASIC METABOLIC PANEL
ANION GAP: 17 — AB (ref 5–15)
BUN: 9 mg/dL (ref 6–23)
CHLORIDE: 95 meq/L — AB (ref 96–112)
CO2: 27 mEq/L (ref 19–32)
CREATININE: 0.89 mg/dL (ref 0.50–1.35)
Calcium: 10.6 mg/dL — ABNORMAL HIGH (ref 8.4–10.5)
GFR calc non Af Amer: >90 mL/min (ref >90)
Glucose, Bld: 111 mg/dL — ABNORMAL HIGH (ref 70–99)
POTASSIUM: 3.2 meq/L — AB (ref 3.7–5.3)
SODIUM: 139 meq/L (ref 137–147)

## 2014-02-10 LAB — CBC WITH DIFFERENTIAL/PLATELET
BASOS ABS: 0 10*3/uL (ref 0.0–0.1)
BASOS PCT: 0 % (ref 0–1)
Basophils Absolute: 0.1 10*3/uL (ref 0.0–0.1)
Basophils Relative: 1 % (ref 0–1)
EOS PCT: 2 % (ref 0–5)
Eosinophils Absolute: 0.1 10*3/uL (ref 0.0–0.7)
Eosinophils Absolute: 0.2 10*3/uL (ref 0.0–0.7)
Eosinophils Relative: 1 % (ref 0–5)
HCT: 45.9 % (ref 39.0–52.0)
HEMATOCRIT: 41.6 % (ref 39.0–52.0)
HEMOGLOBIN: 16 g/dL (ref 13.0–17.0)
Hemoglobin: 14.4 g/dL (ref 13.0–17.0)
LYMPHS ABS: 1.2 10*3/uL (ref 0.7–4.0)
Lymphocytes Relative: 10 % — ABNORMAL LOW (ref 12–46)
Lymphocytes Relative: 14 % (ref 12–46)
Lymphs Abs: 1 10*3/uL (ref 0.7–4.0)
MCH: 32.1 pg (ref 26.0–34.0)
MCH: 31.6 pg (ref 26.0–34.0)
MCHC: 34.9 g/dL (ref 30.0–36.0)
MCHC: 34.6 g/dL (ref 30.0–36.0)
MCV: 92.2 fL (ref 78.0–100.0)
MCV: 91.4 fL (ref 78.0–100.0)
MONOS PCT: 13 % — AB (ref 3–12)
Monocytes Absolute: 1.3 10*3/uL — ABNORMAL HIGH (ref 0.1–1.0)
Monocytes Absolute: 1.4 10*3/uL — ABNORMAL HIGH (ref 0.1–1.0)
Monocytes Relative: 17 % — ABNORMAL HIGH (ref 3–12)
NEUTROS ABS: 7.3 10*3/uL (ref 1.7–7.7)
NEUTROS PCT: 76 % (ref 43–77)
Neutro Abs: 5.6 10*3/uL (ref 1.7–7.7)
Neutrophils Relative %: 66 % (ref 43–77)
PLATELETS: 120 10*3/uL — AB (ref 150–400)
Platelets: 118 10*3/uL — ABNORMAL LOW (ref 150–400)
RBC: 4.98 MIL/uL (ref 4.22–5.81)
RBC: 4.55 MIL/uL (ref 4.22–5.81)
RDW: 12.3 % (ref 11.5–15.5)
RDW: 12.2 % (ref 11.5–15.5)
WBC: 9.7 10*3/uL (ref 4.0–10.5)
WBC: 8.5 10*3/uL (ref 4.0–10.5)

## 2014-02-10 LAB — PROTIME-INR
INR: 1.13 (ref 0.00–1.49)
Prothrombin Time: 14.5 seconds (ref 11.6–15.2)

## 2014-02-10 LAB — APTT: aPTT: 29 seconds (ref 24–37)

## 2014-02-10 LAB — RAPID URINE DRUG SCREEN, HOSP PERFORMED
Amphetamines: NOT DETECTED
BARBITURATES: NOT DETECTED
BENZODIAZEPINES: NOT DETECTED
Cocaine: NOT DETECTED
Opiates: NOT DETECTED
Tetrahydrocannabinol: NOT DETECTED

## 2014-02-10 LAB — MAGNESIUM: MAGNESIUM: 1.9 mg/dL (ref 1.5–2.5)

## 2014-02-10 LAB — ETHANOL

## 2014-02-10 LAB — TSH: TSH: 1.07 u[IU]/mL (ref 0.350–4.500)

## 2014-02-10 LAB — PHOSPHORUS: PHOSPHORUS: 5.4 mg/dL — AB (ref 2.3–4.6)

## 2014-02-10 LAB — MRSA PCR SCREENING: MRSA by PCR: NEGATIVE

## 2014-02-10 MED ORDER — POTASSIUM CHLORIDE 20 MEQ/15ML (10%) PO LIQD
40.0000 meq | Freq: Once | ORAL | Status: DC
  Filled 2014-02-10: qty 30

## 2014-02-10 MED ORDER — SODIUM CHLORIDE 0.9 % IV SOLN
INTRAVENOUS | Status: DC
  Administered 2014-02-11: 01:00:00 via INTRAVENOUS
  Administered 2014-02-11 (×2): 1000 mL via INTRAVENOUS
  Administered 2014-02-12 (×3): via INTRAVENOUS

## 2014-02-10 MED ORDER — PNEUMOCOCCAL VAC POLYVALENT 25 MCG/0.5ML IJ INJ
0.5000 mL | INJECTION | INTRAMUSCULAR | Status: AC
  Administered 2014-02-11: 0.5 mL via INTRAMUSCULAR
  Filled 2014-02-10 (×2): qty 0.5

## 2014-02-10 MED ORDER — ADULT MULTIVITAMIN W/MINERALS CH
1.0000 | ORAL_TABLET | Freq: Every day | ORAL | Status: DC
  Administered 2014-02-11 – 2014-02-13 (×3): 1 via ORAL
  Filled 2014-02-10 (×3): qty 1

## 2014-02-10 MED ORDER — CLONIDINE HCL 0.1 MG PO TABS
0.1000 mg | ORAL_TABLET | Freq: Every day | ORAL | Status: DC
  Administered 2014-02-11 – 2014-02-13 (×3): 0.1 mg via ORAL
  Filled 2014-02-10 (×3): qty 1

## 2014-02-10 MED ORDER — ACETAMINOPHEN 650 MG RE SUPP: 650.0000 mg | Freq: Four times a day (QID) | RECTAL | Status: DC | PRN

## 2014-02-10 MED ORDER — DEXTROSE-NACL 5-0.45 % IV SOLN: INTRAVENOUS | Status: DC

## 2014-02-10 MED ORDER — ALUM & MAG HYDROXIDE-SIMETH 200-200-20 MG/5ML PO SUSP: 30.0000 mL | ORAL | Status: DC | PRN

## 2014-02-10 MED ORDER — ONDANSETRON HCL 4 MG PO TABS: 4.0000 mg | ORAL_TABLET | Freq: Four times a day (QID) | ORAL | Status: DC | PRN

## 2014-02-10 MED ORDER — LORAZEPAM 1 MG PO TABS: 1.0000 mg | ORAL_TABLET | Freq: Four times a day (QID) | ORAL | Status: DC | PRN

## 2014-02-10 MED ORDER — LORAZEPAM 1 MG PO TABS: 1.0000 mg | ORAL_TABLET | Freq: Three times a day (TID) | ORAL | Status: DC | PRN

## 2014-02-10 MED ORDER — LORAZEPAM 1 MG PO TABS
0.0000 mg | ORAL_TABLET | Freq: Four times a day (QID) | ORAL | Status: DC
  Administered 2014-02-10: 1 mg via ORAL
  Filled 2014-02-10: qty 1

## 2014-02-10 MED ORDER — THIAMINE HCL 100 MG/ML IJ SOLN
Freq: Once | INTRAVENOUS | Status: AC
  Administered 2014-02-10: 18:00:00 via INTRAVENOUS
  Filled 2014-02-10: qty 1000

## 2014-02-10 MED ORDER — LORAZEPAM 2 MG/ML IJ SOLN
1.0000 mg | INTRAMUSCULAR | Status: DC | PRN
  Administered 2014-02-10 (×2): 2 mg via INTRAVENOUS
  Administered 2014-02-11: 1 mg via INTRAVENOUS
  Filled 2014-02-10 (×3): qty 1

## 2014-02-10 MED ORDER — VITAMIN B-1 100 MG PO TABS
100.0000 mg | ORAL_TABLET | Freq: Every day | ORAL | Status: DC
  Administered 2014-02-10: 100 mg via ORAL
  Filled 2014-02-10: qty 1

## 2014-02-10 MED ORDER — LORAZEPAM 1 MG PO TABS: 0.0000 mg | ORAL_TABLET | Freq: Two times a day (BID) | ORAL | Status: DC

## 2014-02-10 MED ORDER — ONDANSETRON HCL 4 MG PO TABS: 4.0000 mg | ORAL_TABLET | Freq: Three times a day (TID) | ORAL | Status: DC | PRN

## 2014-02-10 MED ORDER — NICOTINE 21 MG/24HR TD PT24
21.0000 mg | MEDICATED_PATCH | Freq: Every day | TRANSDERMAL | Status: DC | PRN
  Filled 2014-02-10: qty 1

## 2014-02-10 MED ORDER — ONDANSETRON HCL 4 MG/2ML IJ SOLN
4.0000 mg | Freq: Four times a day (QID) | INTRAMUSCULAR | Status: DC | PRN
  Administered 2014-02-12: 4 mg via INTRAVENOUS
  Filled 2014-02-10: qty 2

## 2014-02-10 MED ORDER — LORAZEPAM 2 MG/ML IJ SOLN
2.0000 mg | Freq: Once | INTRAMUSCULAR | Status: AC
  Administered 2014-02-10: 2 mg via INTRAMUSCULAR

## 2014-02-10 MED ORDER — SODIUM CHLORIDE 0.9 % IJ SOLN: 3.0000 mL | Freq: Two times a day (BID) | INTRAMUSCULAR | Status: DC

## 2014-02-10 MED ORDER — VITAMIN B-1 100 MG PO TABS: 100.0000 mg | ORAL_TABLET | Freq: Every day | ORAL | Status: DC

## 2014-02-10 MED ORDER — THIAMINE HCL 100 MG/ML IJ SOLN: 100.0000 mg | Freq: Every day | INTRAMUSCULAR | Status: DC

## 2014-02-10 MED ORDER — ACETAMINOPHEN 325 MG PO TABS
650.0000 mg | ORAL_TABLET | Freq: Four times a day (QID) | ORAL | Status: DC | PRN
  Filled 2014-02-10: qty 2

## 2014-02-10 MED ORDER — LORAZEPAM 1 MG PO TABS
2.0000 mg | ORAL_TABLET | Freq: Once | ORAL | Status: AC
  Administered 2014-02-10: 2 mg via ORAL
  Filled 2014-02-10: qty 2

## 2014-02-10 MED ORDER — LORAZEPAM 2 MG/ML IJ SOLN
INTRAMUSCULAR | Status: AC
  Administered 2014-02-10: 2 mg via INTRAMUSCULAR
  Filled 2014-02-10: qty 1

## 2014-02-10 MED ORDER — LORAZEPAM 2 MG/ML IJ SOLN
1.0000 mg | Freq: Four times a day (QID) | INTRAMUSCULAR | Status: DC | PRN
Start: 2014-02-10 — End: 2014-02-10

## 2014-02-10 MED ORDER — POTASSIUM CHLORIDE 20 MEQ/15ML (10%) PO LIQD
10.0000 meq | Freq: Two times a day (BID) | ORAL | Status: DC
  Filled 2014-02-10: qty 7.5

## 2014-02-10 MED ORDER — FOLIC ACID 1 MG PO TABS
1.0000 mg | ORAL_TABLET | Freq: Every day | ORAL | Status: DC
  Administered 2014-02-11 – 2014-02-13 (×3): 1 mg via ORAL
  Filled 2014-02-10 (×3): qty 1
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
## 2014-02-10 NOTE — ED Notes (Signed)
PT BELONGINGS IN LOCKER 30
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
## 2014-02-10 NOTE — ED Provider Notes (Signed)
Called from Psych ED.  Pt is becoming more tachcyardic and tremulous.  Does not appear to be stable for the psych ED.   Will need inpatient medical treatment. I recommened IV ativan protocol.   On exam pt is tachycardiac and hypertensive.  He is tremulous, picking at his lines and monitors.  Translator was used, pt is confused.  IV ativan ordered.  Banana bag ordered.  Will monitor closely.  Pt is going to be admitted to stepdown unit.    Linwood DibblesJon Argie Applegate, MD 02/10/14 [phone]

## 2014-02-11 ENCOUNTER — Inpatient Hospital Stay (HOSPITAL_COMMUNITY): Payer: Self-pay

## 2014-02-11 DIAGNOSIS — R7989 Other specified abnormal findings of blood chemistry: Secondary | ICD-10-CM

## 2014-02-11 LAB — COMPREHENSIVE METABOLIC PANEL
ALT: 579 U/L — AB (ref 0–53)
AST: 818 U/L — ABNORMAL HIGH (ref 0–37)
Albumin: 3.4 g/dL — ABNORMAL LOW (ref 3.5–5.2)
Alkaline Phosphatase: 64 U/L (ref 39–117)
Anion gap: 14 (ref 5–15)
BUN: 11 mg/dL (ref 6–23)
CO2: 26 meq/L (ref 19–32)
Calcium: 9.5 mg/dL (ref 8.4–10.5)
Chloride: 101 mEq/L (ref 96–112)
Creatinine, Ser: 0.67 mg/dL (ref 0.50–1.35)
GFR calc Af Amer: >90 mL/min (ref >90)
Glucose, Bld: 92 mg/dL (ref 70–99)
Potassium: 3.5 mEq/L — ABNORMAL LOW (ref 3.7–5.3)
Sodium: 141 mEq/L (ref 137–147)
TOTAL PROTEIN: 7.5 g/dL (ref 6.0–8.3)
Total Bilirubin: 1.9 mg/dL — ABNORMAL HIGH (ref 0.3–1.2)

## 2014-02-11 LAB — CBC
HEMATOCRIT: 40.5 % (ref 39.0–52.0)
HEMOGLOBIN: 13.9 g/dL (ref 13.0–17.0)
MCH: 31.7 pg (ref 26.0–34.0)
MCHC: 34.3 g/dL (ref 30.0–36.0)
MCV: 92.5 fL (ref 78.0–100.0)
Platelets: 112 10*3/uL — ABNORMAL LOW (ref 150–400)
RBC: 4.38 MIL/uL (ref 4.22–5.81)
RDW: 12.3 % (ref 11.5–15.5)
WBC: 7.5 10*3/uL (ref 4.0–10.5)

## 2014-02-11 LAB — GLUCOSE, CAPILLARY
Glucose-Capillary: 93 mg/dL (ref 70–99)
Glucose-Capillary: 120 mg/dL — ABNORMAL HIGH (ref 70–99)

## 2014-02-11 LAB — PROTIME-INR
INR: 1.18 (ref 0.00–1.49)
Prothrombin Time: 15 seconds (ref 11.6–15.2)

## 2014-02-11 MED ORDER — POTASSIUM CHLORIDE CRYS ER 10 MEQ PO TBCR
30.0000 meq | EXTENDED_RELEASE_TABLET | Freq: Every day | ORAL | Status: DC
  Administered 2014-02-11 – 2014-02-13 (×3): 30 meq via ORAL
  Filled 2014-02-11 (×5): qty 1

## 2014-02-11 NOTE — Progress Notes (Signed)
Case discussed, agree with plan 

## 2014-02-11 NOTE — Progress Notes (Signed)
Attempted to meet with Pt.  Per RN, Pt somewhat confused.  RN stated that Pt is sleeping and asked if CSW could come back at a later time.  CSW to continue to follow.  Providence CrosbyAmanda Claudia Greenley, LCSW Clinical Social Work [phone]
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
## 2014-02-12 DIAGNOSIS — F1010 Alcohol abuse, uncomplicated: Secondary | ICD-10-CM

## 2014-02-12 LAB — GLUCOSE, CAPILLARY: GLUCOSE-CAPILLARY: 105 mg/dL — AB (ref 70–99)

## 2014-02-12 LAB — COMPREHENSIVE METABOLIC PANEL
ALBUMIN: 3.3 g/dL — AB (ref 3.5–5.2)
ALK PHOS: 69 U/L (ref 39–117)
ALT: 496 U/L — ABNORMAL HIGH (ref 0–53)
AST: 408 U/L — ABNORMAL HIGH (ref 0–37)
Anion gap: 11 (ref 5–15)
BILIRUBIN TOTAL: 1 mg/dL (ref 0.3–1.2)
BUN: 6 mg/dL (ref 6–23)
CHLORIDE: 99 meq/L (ref 96–112)
CO2: 28 mEq/L (ref 19–32)
Calcium: 9.1 mg/dL (ref 8.4–10.5)
Creatinine, Ser: 0.54 mg/dL (ref 0.50–1.35)
GFR calc Af Amer: >90 mL/min (ref >90)
GFR calc non Af Amer: >90 mL/min (ref >90)
Glucose, Bld: 84 mg/dL (ref 70–99)
POTASSIUM: 3.7 meq/L (ref 3.7–5.3)
Sodium: 138 mEq/L (ref 137–147)
Total Protein: 7.4 g/dL (ref 6.0–8.3)

## 2014-02-12 MED ORDER — SODIUM CHLORIDE 0.9 % IV SOLN
INTRAVENOUS | Status: DC
  Administered 2014-02-12: 22:00:00 via INTRAVENOUS
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
## 2014-02-13 DIAGNOSIS — F10939 Alcohol use, unspecified with withdrawal, unspecified: Secondary | ICD-10-CM

## 2014-02-13 DIAGNOSIS — F10239 Alcohol dependence with withdrawal, unspecified: Secondary | ICD-10-CM

## 2014-02-13 LAB — GLUCOSE, CAPILLARY: GLUCOSE-CAPILLARY: 103 mg/dL — AB (ref 70–99)

## 2014-02-13 NOTE — Progress Notes (Signed)
Clinical Social Work  Patient reports that sister will come and pick him up from hospital at 4pm. Patient told AD that he is unsure if sister is coming but he will call brother-in-law as well. CSW confirmed address with patient and left directions for bus and bus pass with NS if patient is unable to locate family for ride.  MentoneHolly Demaree Liberto, KentuckyLCSW [phone]
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
## 2014-03-03 ENCOUNTER — Encounter (HOSPITAL_COMMUNITY): Payer: Self-pay | Admitting: Emergency Medicine

## 2015-08-25 IMAGING — US US ABDOMEN PORT
1 series · 14 of 25 positions shown · non-contrast
Comparison: None.

CLINICAL DATA: Alcoholic hepatitis

EXAM:
ULTRASOUND PORTABLE ABDOMEN

[Series 1: us abdomen port · 0.22mm/px · 14 of 98 slices shown]
[im 1/98]
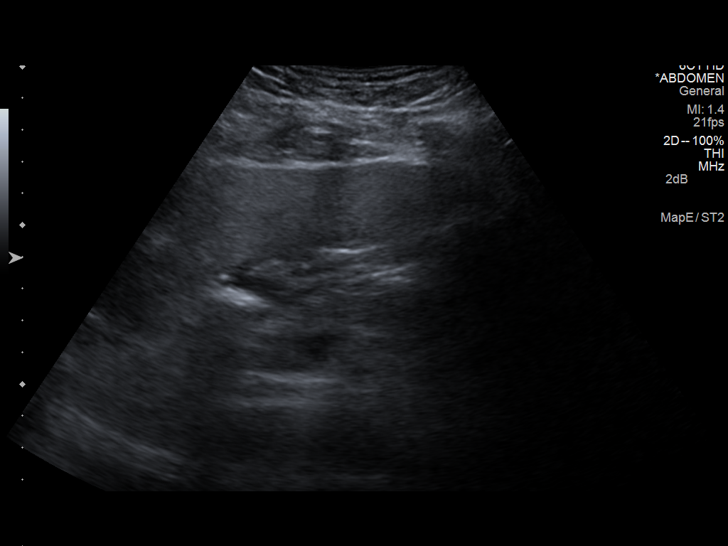
[im 9/98]
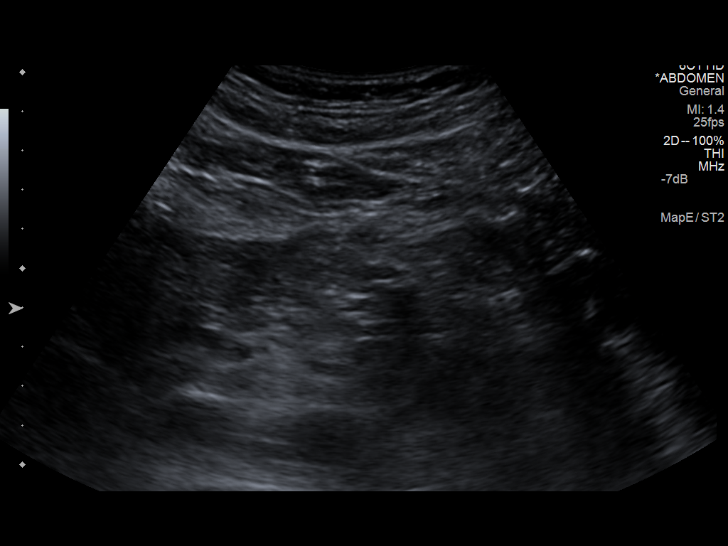
[im 17/98]
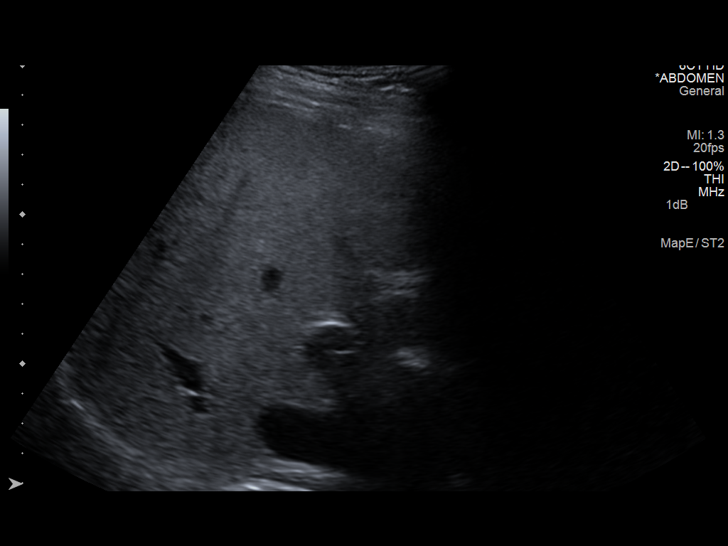
[im 25/98]
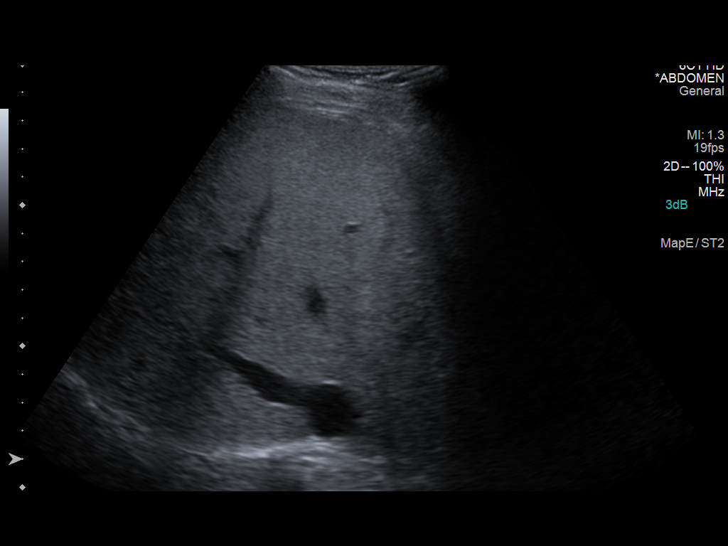
[im 33/98]
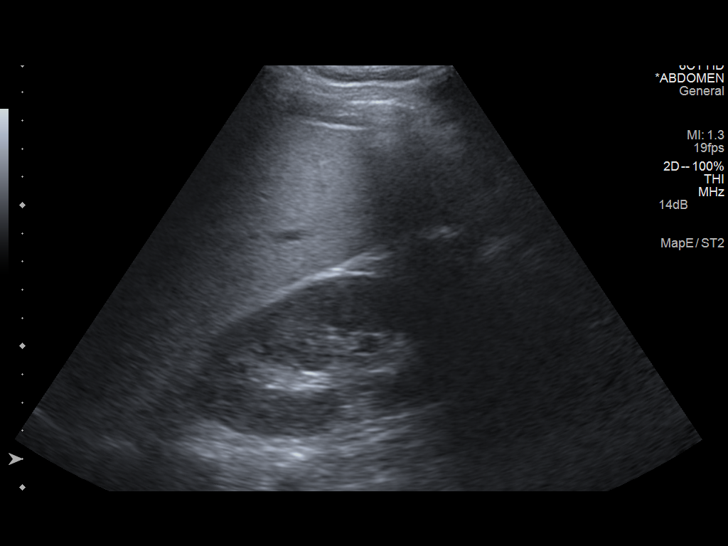
[im 37/98]
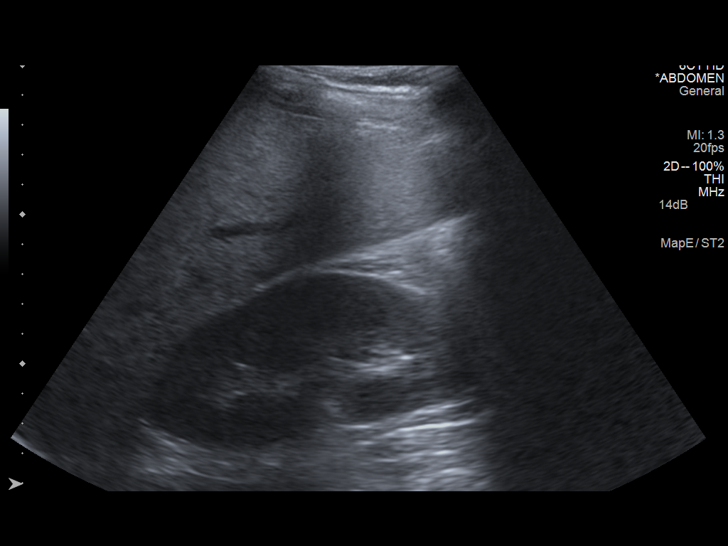
[im 45/98]
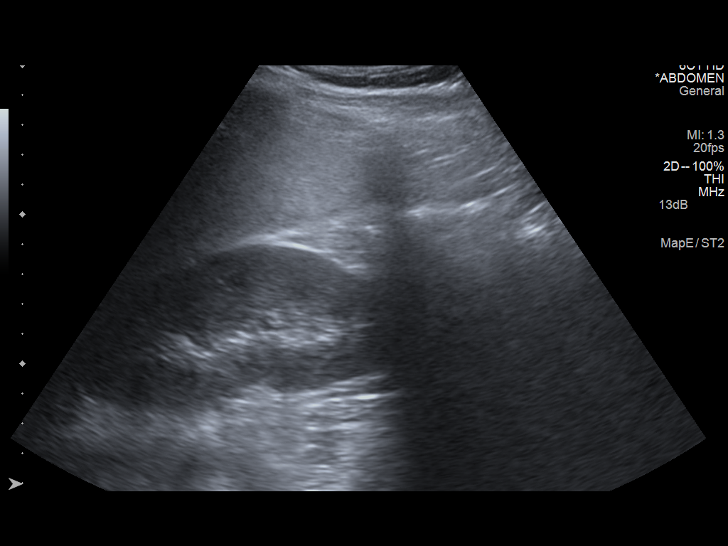
[im 53/98]
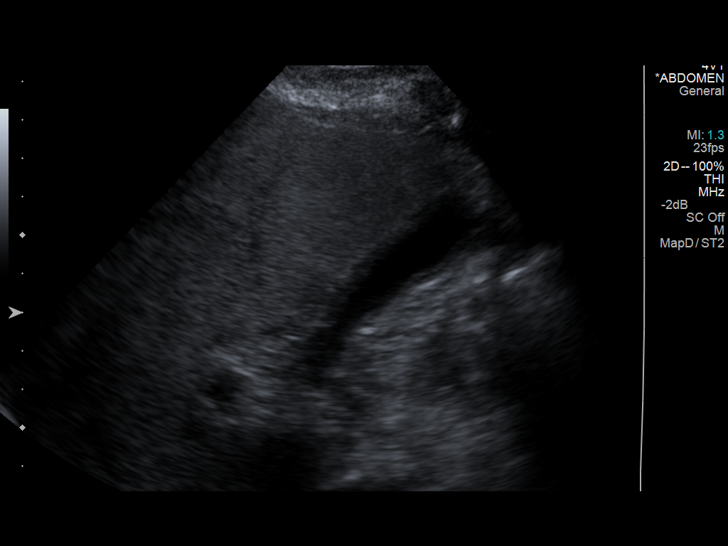
[im 61/98]
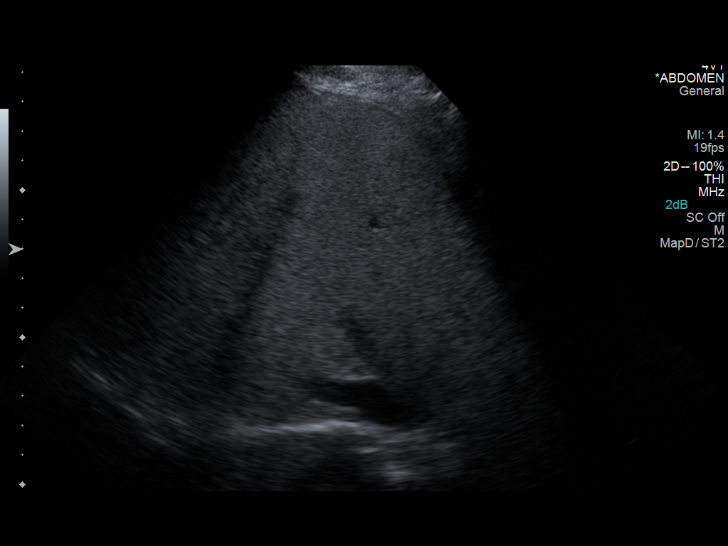
[im 65/98]
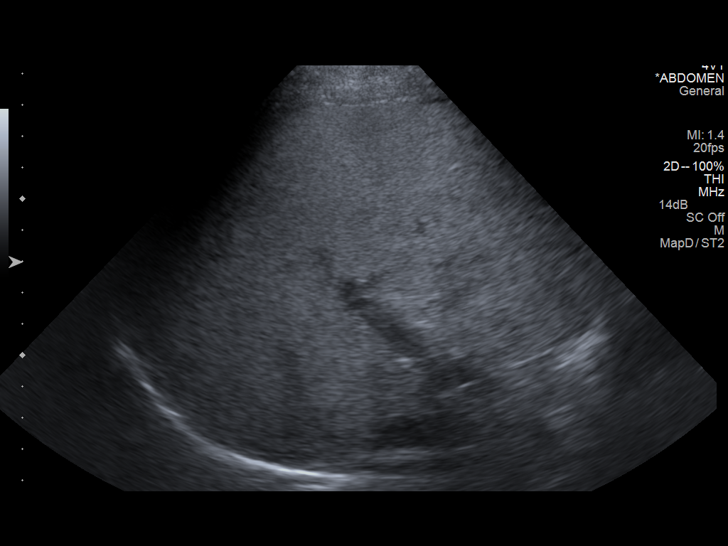
[im 73/98]
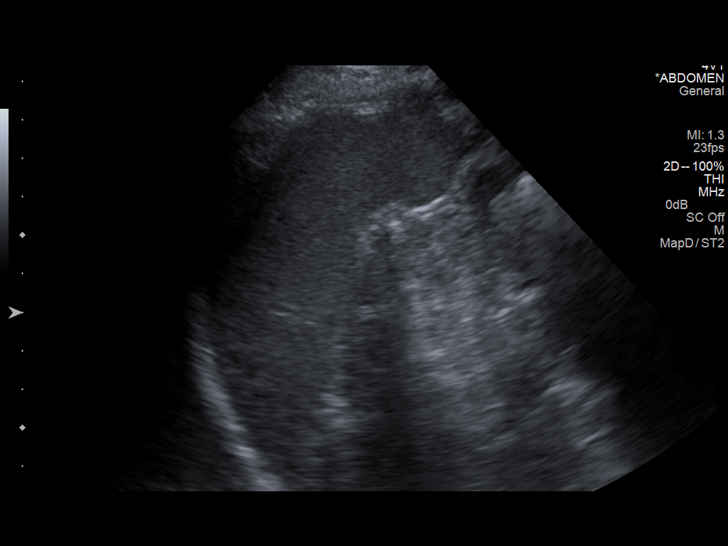
[im 81/98]
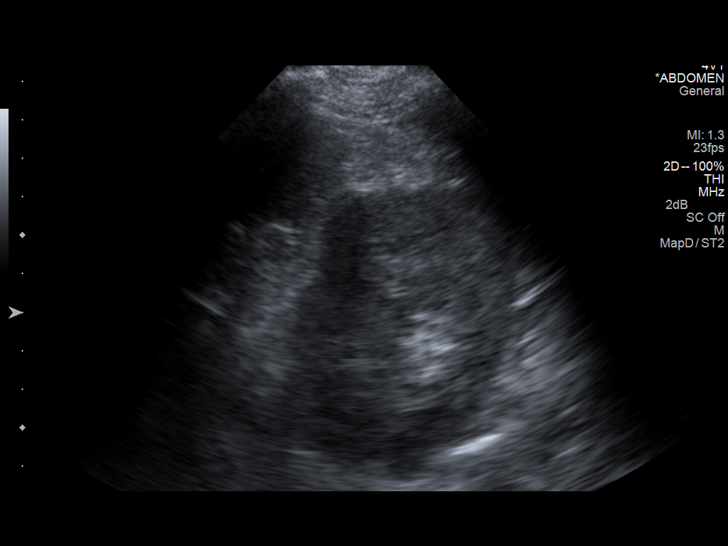
[im 89/98]
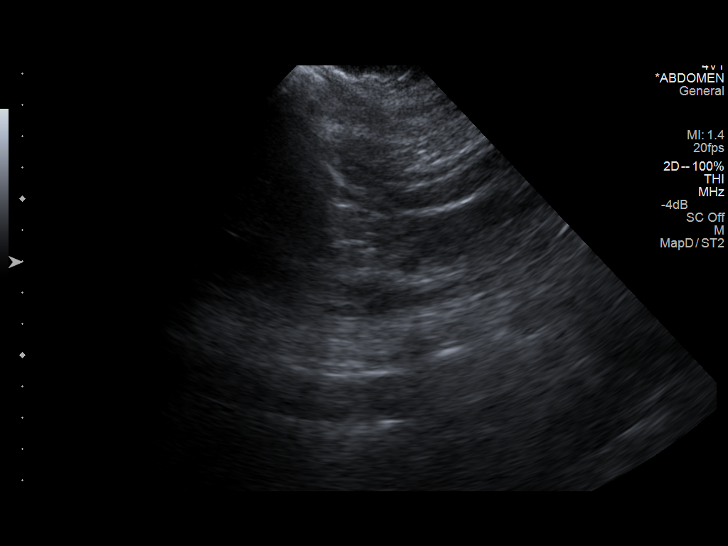
[im 98/98]
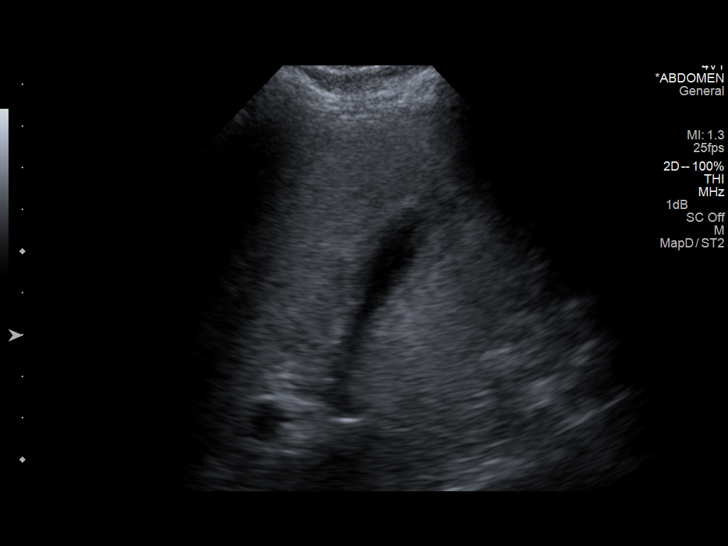

[14 of 25 positions shown; findings below may reference images not displayed]

FINDINGS: Gallbladder:

Contracted, without gallstones, pericholecystic fluid, or
sonographic Murphy sign. Wall thickness is normal at 3 mm.

Common bile duct:

Diameter: 3 mm

Liver:

Diffusely increased echogenicity without focal abnormality or
intrahepatic ductal dilatation.

IVC:

No abnormality visualized.

Pancreas:

Obscured by bowel gas

Spleen:

Punctate echogenic foci likely represent granulomas. No other
abnormality.

Right Kidney:

Length: 12.0 cm. Echogenicity within normal limits. No mass or
hydronephrosis visualized.

Left Kidney:

Length: 11.8 cm. Echogenicity within normal limits. No mass or
hydronephrosis visualized.

Abdominal aorta:

No aneurysm visualized.

Other findings:

None.
IMPRESSION: Increased hepatic echogenicity which may suggest steatosis or other
infiltrative processes. No focal acute finding.

## 2018-02-25 ENCOUNTER — Encounter (HOSPITAL_COMMUNITY): Payer: Self-pay

## 2018-02-25 ENCOUNTER — Emergency Department (HOSPITAL_COMMUNITY)
Admission: EM | Admit: 2018-02-25 | Discharge: 2018-02-26 | Disposition: A | Discharge status: Home / Self Care | Payer: Self-pay | Attending: Emergency Medicine | Admitting: Emergency Medicine

## 2018-02-25 DIAGNOSIS — F1014 Alcohol abuse with alcohol-induced mood disorder: Secondary | ICD-10-CM | POA: Insufficient documentation

## 2018-02-25 DIAGNOSIS — R443 Hallucinations, unspecified: Secondary | ICD-10-CM

## 2018-02-25 DIAGNOSIS — R441 Visual hallucinations: Secondary | ICD-10-CM | POA: Insufficient documentation

## 2018-02-25 DIAGNOSIS — R45851 Suicidal ideations: Secondary | ICD-10-CM | POA: Insufficient documentation

## 2018-02-25 DIAGNOSIS — Y904 Blood alcohol level of 80-99 mg/100 ml: Secondary | ICD-10-CM | POA: Insufficient documentation

## 2018-02-25 LAB — COMPREHENSIVE METABOLIC PANEL
ALBUMIN: 3.4 g/dL — AB (ref 3.5–5.0)
ALT: 95 U/L — AB (ref 0–44)
AST: 246 U/L — ABNORMAL HIGH (ref 15–41)
Alkaline Phosphatase: 87 U/L (ref 38–126)
Anion gap: 11 (ref 5–15)
BUN: 7 mg/dL (ref 6–20)
CHLORIDE: 103 mmol/L (ref 98–111)
CO2: 25 mmol/L (ref 22–32)
CREATININE: 0.43 mg/dL — AB (ref 0.61–1.24)
Calcium: 8.9 mg/dL (ref 8.9–10.3)
GFR calc non Af Amer: >60 mL/min (ref >60)
GLUCOSE: 114 mg/dL — AB (ref 70–99)
Potassium: 3.4 mmol/L — ABNORMAL LOW (ref 3.5–5.1)
SODIUM: 139 mmol/L (ref 135–145)
Total Bilirubin: 0.6 mg/dL (ref 0.3–1.2)
Total Protein: 8.1 g/dL (ref 6.5–8.1)

## 2018-02-25 LAB — RAPID URINE DRUG SCREEN, HOSP PERFORMED
AMPHETAMINES: NOT DETECTED
Barbiturates: NOT DETECTED
Benzodiazepines: NOT DETECTED
COCAINE: NOT DETECTED
OPIATES: NOT DETECTED
TETRAHYDROCANNABINOL: NOT DETECTED

## 2018-02-25 LAB — ETHANOL: Alcohol, Ethyl (B): 94 mg/dL — ABNORMAL HIGH (ref <10)

## 2018-02-25 LAB — CBC
HCT: 40.1 % (ref 39.0–52.0)
HEMOGLOBIN: 13.7 g/dL (ref 13.0–17.0)
MCH: 32.4 pg (ref 26.0–34.0)
MCHC: 34.2 g/dL (ref 30.0–36.0)
MCV: 94.8 fL (ref 78.0–100.0)
Platelets: 87 10*3/uL — ABNORMAL LOW (ref 150–400)
RBC: 4.23 MIL/uL (ref 4.22–5.81)
RDW: 12.4 % (ref 11.5–15.5)
WBC: 8.9 10*3/uL (ref 4.0–10.5)

## 2018-02-25 LAB — SALICYLATE LEVEL

## 2018-02-25 LAB — ACETAMINOPHEN LEVEL

## 2018-02-25 LAB — AMMONIA: Ammonia: 40 umol/L — ABNORMAL HIGH (ref 9–35)

## 2018-02-25 LAB — LIPASE, BLOOD: Lipase: 48 U/L (ref 11–51)

## 2018-02-25 MED ORDER — GABAPENTIN 300 MG PO CAPS
300.0000 mg | ORAL_CAPSULE | Freq: Two times a day (BID) | ORAL | Status: DC
  Administered 2018-02-25 – 2018-02-26 (×3): 300 mg via ORAL
  Filled 2018-02-25 (×3): qty 1

## 2018-02-25 MED ORDER — VITAMIN B-1 100 MG PO TABS
100.0000 mg | ORAL_TABLET | Freq: Every day | ORAL | Status: DC
  Administered 2018-02-26: 100 mg via ORAL

## 2018-02-25 MED ORDER — LORAZEPAM 1 MG PO TABS
0.0000 mg | ORAL_TABLET | Freq: Four times a day (QID) | ORAL | Status: DC
  Administered 2018-02-25: 1 mg via ORAL
  Administered 2018-02-25: 2 mg via ORAL
  Administered 2018-02-26 (×2): 1 mg via ORAL
  Filled 2018-02-25 (×3): qty 1
  Filled 2018-02-25: qty 2

## 2018-02-25 MED ORDER — LORAZEPAM 1 MG PO TABS
1.0000 mg | ORAL_TABLET | Freq: Four times a day (QID) | ORAL | Status: DC | PRN
  Administered 2018-02-25 (×2): 1 mg via ORAL
  Filled 2018-02-25: qty 1

## 2018-02-25 MED ORDER — STERILE WATER FOR INJECTION IJ SOLN
INTRAMUSCULAR | Status: AC
  Administered 2018-02-25: 1.2 mL
  Filled 2018-02-25: qty 10

## 2018-02-25 MED ORDER — THIAMINE HCL 100 MG/ML IJ SOLN
100.0000 mg | Freq: Once | INTRAMUSCULAR | Status: AC
  Administered 2018-02-25: 100 mg via INTRAMUSCULAR
  Filled 2018-02-25: qty 2

## 2018-02-25 MED ORDER — LORAZEPAM 2 MG/ML IJ SOLN: 0.0000 mg | Freq: Four times a day (QID) | INTRAMUSCULAR | Status: DC

## 2018-02-25 MED ORDER — ADULT MULTIVITAMIN W/MINERALS CH
1.0000 | ORAL_TABLET | Freq: Every day | ORAL | Status: DC
  Administered 2018-02-25 – 2018-02-26 (×2): 1 via ORAL
  Filled 2018-02-25 (×2): qty 1

## 2018-02-25 MED ORDER — LOPERAMIDE HCL 2 MG PO CAPS: 2.0000 mg | ORAL_CAPSULE | ORAL | Status: DC | PRN

## 2018-02-25 MED ORDER — LORAZEPAM 1 MG PO TABS
1.0000 mg | ORAL_TABLET | Freq: Once | ORAL | Status: DC
Start: 2018-02-25 — End: 2018-02-26
  Filled 2018-02-25: qty 1

## 2018-02-25 MED ORDER — HYDROXYZINE HCL 25 MG PO TABS: 25.0000 mg | ORAL_TABLET | Freq: Four times a day (QID) | ORAL | Status: DC | PRN

## 2018-02-25 MED ORDER — ZIPRASIDONE MESYLATE 20 MG IM SOLR
20.0000 mg | Freq: Once | INTRAMUSCULAR | Status: AC
  Administered 2018-02-25: 20 mg via INTRAMUSCULAR
  Filled 2018-02-25: qty 20

## 2018-02-25 MED ORDER — ONDANSETRON 4 MG PO TBDP: 4.0000 mg | ORAL_TABLET | Freq: Four times a day (QID) | ORAL | Status: DC | PRN

## 2018-02-25 MED ORDER — THIAMINE HCL 100 MG/ML IJ SOLN: 100.0000 mg | Freq: Every day | INTRAMUSCULAR | Status: DC

## 2018-02-25 MED ORDER — LORAZEPAM 2 MG/ML IJ SOLN: 0.0000 mg | Freq: Two times a day (BID) | INTRAMUSCULAR | Status: DC

## 2018-02-25 MED ORDER — LORAZEPAM 1 MG PO TABS: 0.0000 mg | ORAL_TABLET | Freq: Two times a day (BID) | ORAL | Status: DC

## 2018-02-25 MED ORDER — VITAMIN B-1 100 MG PO TABS
100.0000 mg | ORAL_TABLET | Freq: Every day | ORAL | Status: DC
  Filled 2018-02-25: qty 1
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
## 2018-02-25 NOTE — ED Notes (Signed)
Pt is alert to person and place but not to time and situation. Pt denies SI, HI and is denying all hallucinations at this time. Pt does not appear to be responding to internal stimuli at this time

## 2018-02-25 NOTE — ED Triage Notes (Signed)
EMS was called out to the patients home earlier to evaluate a laceration on his elbow, they treated him and didn't transport him About 20 minutes later [employer] was called out there because he stated that he wwas hearing voices and wanted to hurt himself, pt denies HI Pt also hasn't slept in three days and has been drinking all night

## 2018-02-25 NOTE — ED Notes (Signed)
Dr. Effie ShyWentz notified of patient behaviors.  No further orders given.

## 2018-02-25 NOTE — ED Notes (Signed)
PA at bedside.

## 2018-02-25 NOTE — ED Notes (Signed)
Patient awake after a very short nap.  Continues to act strange trying to take the bottom of his bed off.

## 2018-02-25 NOTE — ED Provider Notes (Addendum)
[facility]-EMERGENCY DEPT Provider Note   CSN: [id number] Arrival date & time: 02/25/18  0507     History   Chief Complaint Chief Complaint  Patient presents with  . Suicidal    HPI Vincent Peters is a 37 y.o. male with PMH/o Alcohol abuse, Cocaine abuse who presents for evaluation hallucinations that began last night.  Patient reports that he was at home by himself and started hallucinating.  He states that he was seeing men, women and children at his apartment and on top of cars.  He states that then he heard people singing and music.  Patient called the police out to his house.  He states that he has a Emergency planning/management officer if he fell to see people and they said no.  Patient was still seeing hallucinations so he was transported to the ED for further evaluation.  He initially called EMS for symptoms.  He had a laceration noted to his left upper extremity which he states he sustained several days ago at work.  He was bandaged and brought to the ED.  He does report that he has been drinking.  He states he beers last night but cannot tell me how much.  He denies any cocaine, heroin use.  Patient states that the voices were not telling him to do anything.  He denies any SI, HI.  Patient feels like "I have a mental disorder in my head that needs to be checked."  Denies any previous diagnosis of psychiatric disorders.  He denies any outpatient therapy.  Patient had initially told EMS that the voices were telling him to hurt himself.  Patient states that currently he has no SI, HI. Patient reports he has some abdominal pain that has been ongoing for the last few days. He denies any nausea/vomiting, chest pain.  The history is provided by the patient.    Past Medical History:  Diagnosis Date  . Alcohol abuse   . Cocaine abuse [facility])     Patient Active Problem List   Diagnosis Date Noted  . Alcohol withdrawal (HCC) 02/10/2014  . Hypokalemia 02/10/2014  .  Thrombocytopenia (HCC) 02/10/2014  . Hypercalcemia 02/10/2014  . Delirium tremens (HCC) 02/10/2014    History reviewed. No pertinent surgical history.      Home Medications    Prior to Admission medications   Medication Sig Start Date End Date Taking? Authorizing Provider  ibuprofen (ADVIL,MOTRIN) 600 MG tablet Take 1 tablet (600 mg total) by mouth every 6 (six) hours as needed. Patient not taking: Reported on 02/25/2018 01/17/14   Loren Racer, MD    Family History History reviewed. No pertinent family history.  Social History Social History   Tobacco Use  . Smoking status: Never Smoker  . Smokeless tobacco: Never Used  Substance Use Topics  . Alcohol use: Yes    Alcohol/week: 100.8 oz    Types: 168 Cans of beer per week  . Drug use: Yes    Types: Cocaine     Allergies   Patient has no known allergies.   Review of Systems Review of Systems  Respiratory: Negative for cough and shortness of breath.   Cardiovascular: Negative for chest pain.  Gastrointestinal: Positive for abdominal pain. Negative for nausea and vomiting.  Psychiatric/Behavioral: Positive for hallucinations and self-injury.  All other systems reviewed and are negative.    Physical Exam Updated Vital Signs BP (!) 154/99 (BP Location: Left Arm)   Pulse (!) 115   Temp 97.9 F (36.6 C) (  Oral)   Resp 20   SpO2 98%   Physical Exam  Constitutional: He is oriented to person, place, and time. He appears well-developed and well-nourished.  HENT:  Head: Normocephalic and atraumatic.  Mouth/Throat: Oropharynx is clear and moist and mucous membranes are normal.  Eyes: Pupils are equal, round, and reactive to light. Conjunctivae, EOM and lids are normal.  Neck: Full passive range of motion without pain.  Cardiovascular: Normal rate, regular rhythm, normal heart sounds and normal pulses. Exam reveals no gallop and no friction rub.  No murmur heard. Pulmonary/Chest: Effort normal and breath sounds  normal.  Abdominal: Soft. Normal appearance. There is generalized tenderness. There is no rigidity and no guarding.  Abdomen is soft, non-distended. No rigidity, no guarding. Diffuse tenderness noted throughout with no focal point.   Musculoskeletal: Normal range of motion.  Neurological: He is alert and oriented to person, place, and time.  Skin: Skin is warm and dry. Capillary refill takes less than 2 seconds.  2 cm linear laceration that appears to be healing noted to the posterior aspect of the proximal left forearm.  Surrounding warmth, erythema.  Psychiatric: He has a normal mood and affect. His speech is normal.  Nursing note and vitals reviewed.    ED Treatments / Results  Labs (all labs ordered are listed, but only abnormal results are displayed) Labs Reviewed  COMPREHENSIVE METABOLIC PANEL - Abnormal; Notable for the following components:      Result Value   Potassium 3.4 (*)    Glucose, Bld 114 (*)    Creatinine, Ser 0.43 (*)    Albumin 3.4 (*)    AST 246 (*)    ALT 95 (*)    All other components within normal limits  ETHANOL - Abnormal; Notable for the following components:   Alcohol, Ethyl (B) 94 (*)    All other components within normal limits  ACETAMINOPHEN LEVEL - Abnormal; Notable for the following components:   Acetaminophen (Tylenol), Serum <10 (*)    All other components within normal limits  CBC - Abnormal; Notable for the following components:   Platelets 87 (*)    All other components within normal limits  SALICYLATE LEVEL  RAPID URINE DRUG SCREEN, HOSP PERFORMED  LIPASE, BLOOD    EKG None  Radiology No results found.  Procedures Procedures (including critical care time)  Medications Ordered in ED Medications  gabapentin (NEURONTIN) capsule 300 mg (300 mg Oral Given 02/25/18 1047)  thiamine (VITAMIN B-1) tablet 100 mg (has no administration in time range)  multivitamin with minerals tablet 1 tablet (1 tablet Oral Given 02/25/18 1058)    LORazepam (ATIVAN) tablet 1 mg (1 mg Oral Given 02/25/18 1059)  hydrOXYzine (ATARAX/VISTARIL) tablet 25 mg (has no administration in time range)  loperamide (IMODIUM) capsule 2-4 mg (has no administration in time range)  ondansetron (ZOFRAN-ODT) disintegrating tablet 4 mg (has no administration in time range)  LORazepam (ATIVAN) injection 0-4 mg (has no administration in time range)    Or  LORazepam (ATIVAN) tablet 0-4 mg (has no administration in time range)  LORazepam (ATIVAN) injection 0-4 mg (has no administration in time range)    Or  LORazepam (ATIVAN) tablet 0-4 mg (has no administration in time range)  thiamine (VITAMIN B-1) tablet 100 mg (has no administration in time range)    Or  thiamine (B-1) injection 100 mg (has no administration in time range)  thiamine (B-1) injection 100 mg (100 mg Intramuscular Given 02/25/18 1103)     Initial  Impression / Assessment and Plan / ED Course  I have reviewed the triage vital signs and the nursing notes.  Pertinent labs & imaging results that were available during my care of the patient were reviewed by me and considered in my medical decision making (see chart for details).     37 y.o. M with past medical history of alcohol abuse, cocaine abuse presents for evaluation of hallucinations, self injury.  Called EMS because he was seeing people in his house telling him to hurt himself.  Patient denies any SI, HI with me.  Patient states that "he needs a mental evaluation because he has a mental disorder."  He does endorse drinking last night. Patient is afebrile, non-toxic appearing, sitting comfortably on examination table. Vital signs reviewed and stable.  On exam, he has some generalized tenderness noted to his abdomen.  He also has a laceration that appears to be several days old noted to the posterior aspect of his left upper extremity.  No surrounding signs of infection.  Plan to check basic medical clearance labs, consult TTS.  CBC shows no  evidence of leukocytosis, anemia.  Acetaminophen level, salicylate level are unremarkable.  Ethanol level is 94.  Drug screen shows no evidence of drug use.  CMP shows potassium of 3.4, glucose of 114.  Patient has elevations in his AST and ALT at 246 and 95 respectively.  Review of records show that he has had this previously.   Behavior health is recommending overnight observation. CIWA protocol initiated given patient's alcohol history. Updated patient on plan. He is agreeable.  RN asked me to evaluate patient as she felt he was more agitated and hallucinating. Patient did express some hallucinations and seemed agitated. CIWA score is 10. He has already received ativan. Will give small dose of haldol.    Final Clinical Impressions(s) / ED Diagnoses   Final diagnoses:  Suicidal thoughts  Hallucination    ED Discharge Orders    None       Maxwell CaulLayden, Lindsey A, PA-C 02/25/18 1459    Maxwell CaulLayden, Lindsey A, PA-C 02/25/18 1623    Azalia Bilisampos, Kevin, MD 02/26/18 [phone]

## 2018-02-25 NOTE — Patient Outreach (Signed)
ED Peer Support Specialist Patient Intake (Complete at intake & 30-60 Day Follow-up)  Name: Nathanal Hermiz  MRN: [id number]  Age: 37 y.o.   Date of Admission: 02/25/2018  Intake: Initial Comments:      Primary Reason Admitted: EMS was called out to the patients home earlier to evaluate a laceration on his elbow, they treated him and didn't transport him About 20 minutes later [employer] was called out there because he stated that he wwas hearing voices and wanted to hurt himself, pt denies HI Pt also hasn't slept in three days and has been drinking all night    Lab values: Alcohol/ETOH: Positive Positive UDS? No Amphetamines: No Barbiturates: No Benzodiazepines: No Cocaine: No Opiates: No Cannabinoids: No  Demographic information: Gender: Male Ethnicity: Latino Marital Status: Single Insurance Status: Uninsured/Self-pay Ecologist (Work Neurosurgeon, Physicist, medical, etc.: No Lives with: Friend/Rommate Living situation: House/Apartment  Reported Patient History: Patient reported health conditions: Other (comment)(feet go numb) Patient aware of HIV and hepatitis status: No  In past year, has patient visited ED for any reason? Yes  Number of ED visits:    Reason(s) for visit:    In past year, has patient been hospitalized for any reason? No  Number of hospitalizations:    Reason(s) for hospitalization:    In past year, has patient been arrested? No  Number of arrests:    Reason(s) for arrest:    In past year, has patient been incarcerated? No  Number of incarcerations:    Reason(s) for incarceration:    In past year, has patient received medication-assisted treatment? No  In past year, patient received the following treatments: Other (comment)  In past year, has patient received any harm reduction services? No  Did this include any of the following?    In past year, has patient received care from a mental health provider  for diagnosis other than SUD? No  In past year, is this first time patient has overdosed? No  Number of past overdoses:    In past year, is this first time patient has been hospitalized for an overdose? No  Number of hospitalizations for overdose(s):    Is patient currently receiving treatment for a mental health diagnosis? No  Patient reports experiencing difficulty participating in SUD treatment: No    Most important reason(s) for this difficulty?    Has patient received prior services for treatment? No  In past, patient has received services from following agencies:    Plan of Care:  Suggested follow up at these agencies/treatment centers: [facility] Treatment Team), [facility])(Stated he wants to receive either Detox or treatment services. )  Other information: CPSS and John met with Pt with the interpreter to gain information to better assist Pt. CPSS was able to complete the series of question to better help support Pt CPSS was made aware that Pt will be staying in the ER for observation for 24 hours. CPSS made Pt aware that CPSS will contact a few facilities to try to find placement for Pt. CPSS will meet with Pt later on today.     Aaron Edelman Lashaye Fisk, CPSS  02/25/2018 11:41 AM

## 2018-02-25 NOTE — ED Notes (Signed)
MD notified of patient continuously hallucinating and being restless.  Prn order obtained.

## 2018-02-25 NOTE — [facility] Counselor (Signed)
Disposition:   Case was staffed with Dr. Sharma CovertNorman and Arville CareParks, NP. Recommendation overnight observation for safety and security.

## 2018-02-26 DIAGNOSIS — F332 Major depressive disorder, recurrent severe without psychotic features: Secondary | ICD-10-CM

## 2018-02-26 DIAGNOSIS — R4587 Impulsiveness: Secondary | ICD-10-CM

## 2018-02-26 DIAGNOSIS — F419 Anxiety disorder, unspecified: Secondary | ICD-10-CM

## 2018-02-26 DIAGNOSIS — F1014 Alcohol abuse with alcohol-induced mood disorder: Secondary | ICD-10-CM | POA: Diagnosis present

## 2018-02-26 NOTE — Consult Note (Addendum)
[facility]  02/26/2018 1:45 PM William HamburgerVictorino Hernandez-Bautista  MRN:  [id number] Principal Problem: Alcohol abuse with alcohol-induced mood disorder [facility](HCC) Discharge Diagnoses:  Patient Active Problem List   Diagnosis Date Noted  . Alcohol withdrawal (HCC) [F10.239] 02/10/2014  . Hypokalemia [E87.6] 02/10/2014  . Thrombocytopenia (HCC) [D69.6] 02/10/2014  . Hypercalcemia [E83.52] 02/10/2014  . Delirium tremens (HCC) [F10.231] 02/10/2014    Subjective: Pt was seen and chart reviewed with treatment team and Dr Sharma CovertNorman. Wall-E interpreter was used, Pt is Spanish speaking.  Pt denies suicidal/homicidal ideation, denies auditory/visual hallucinations and does not appear to be responding to internal stimuli. 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 Pt stated he is in danger of losing his job because of his alcohol abuse. Pt lives with three roommates, is not married and has no children. Pt stated he has been drinking this much for 16 years. Pt has never been in rehab and wants to speak to Peer Support for help navigating the substance abuse treatment facilities in the community. Pt's UDS negative, BAL 94. Pt was in active withdrawal on 02-25-18 and required multiple Ativan administrations to keep his symptoms under control. Today he is doing much better and has no complaints of pain, tremors, nausea, vomiting, or diarrhea. He reports a history of seizures related to alcohol use. Pt's labwork was unremarkable and no other diagnostic testing was performed during this admission. Pt is stable and psychiatrically clear for discharge.   Total Time spent with patient: 45 minutes  Past Psychiatric History: As above Past Medical History:  Past Medical History:  Diagnosis Date  . Alcohol abuse   . Cocaine abuse (HCC)    History reviewed. No pertinent surgical history. Family History: History reviewed. No pertinent family history. Family Psychiatric  History:  Denies  Social History:  Social History   Substance and Sexual Activity  Alcohol Use Yes  . Alcohol/week: 100.8 oz  . Types: 168 Cans of beer per week    Social History   Substance and Sexual Activity  Drug Use Yes  . Types: Cocaine   Social History   Socioeconomic History  . Marital status: Single    Spouse name: Not on file  . Number of children: Not on file  . Years of education: Not on file  . Highest education level: Not on file  Occupational History  . Not on file  Social Needs  . Financial resource strain: Not on file  . Food insecurity:    Worry: Not on file    Inability: Not on file  . Transportation needs:    Medical: Not on file    Non-medical: Not on file  Tobacco Use  . Smoking status: Never Smoker  . Smokeless tobacco: Never Used  Substance and Sexual Activity  . Alcohol use: Yes    Alcohol/week: 100.8 oz    Types: 168 Cans of beer per week  . Drug use: Yes    Types: Cocaine  . Sexual activity: Not on file  Lifestyle  . Physical activity:    Days per week: Not on file    Minutes per session: Not on file  . Stress: Not on file  Relationships  . Social connections:    Talks on phone: Not on file    Gets together: Not on file    Attends religious service: Not on file    Active member of club or organization: Not on file    Attends  meetings of clubs or organizations: Not on file    Relationship status: Not on file  Other Topics Concern  . Not on file  Social History Narrative   ** Merged History Encounter **        Has this patient used any form of tobacco in the last 30 days? (Cigarettes, Smokeless Tobacco, Cigars, and/or Pipes) Prescription not provided because: Pt declined  Current Medications: Current Facility-Administered Medications  Medication Dose Route Frequency Provider Last Rate Last Dose  . gabapentin (NEURONTIN) capsule 300 mg  300 mg Oral BID Laveda Abbe, NP   300 mg at 02/26/18 7829  . hydrOXYzine (ATARAX/VISTARIL)  tablet 25 mg  25 mg Oral Q6H PRN Laveda Abbe, NP      . loperamide (IMODIUM) capsule 2-4 mg  2-4 mg Oral PRN Laveda Abbe, NP      . LORazepam (ATIVAN) injection 0-4 mg  0-4 mg Intravenous Q6H Graciella Freer A, PA-C       Or  . LORazepam (ATIVAN) tablet 0-4 mg  0-4 mg Oral Q6H Layden, Lindsey A, PA-C   1 mg at 02/26/18 1222  . [START ON 02/27/2018] LORazepam (ATIVAN) injection 0-4 mg  0-4 mg Intravenous Q12H Graciella Freer A, PA-C       Or  . Melene Muller ON 02/27/2018] LORazepam (ATIVAN) tablet 0-4 mg  0-4 mg Oral Q12H Layden, Lindsey A, PA-C      . LORazepam (ATIVAN) tablet 1 mg  1 mg Oral Q6H PRN Laveda Abbe, NP   1 mg at 02/25/18 1605  . LORazepam (ATIVAN) tablet 1 mg  1 mg Oral Once Mancel Bale, MD      . multivitamin with minerals tablet 1 tablet  1 tablet Oral Daily Laveda Abbe, NP   1 tablet at 02/26/18 [phone]  . ondansetron (ZOFRAN-ODT) disintegrating tablet 4 mg  4 mg Oral Q6H PRN Laveda Abbe, NP      . thiamine (VITAMIN B-1) tablet 100 mg  100 mg Oral Daily Graciella Freer A, PA-C   100 mg at 02/26/18 3086   Or  . thiamine (B-1) injection 100 mg  100 mg Intravenous Daily Layden, Lindsey A, PA-C      . thiamine (VITAMIN B-1) tablet 100 mg  100 mg Oral Daily Laveda Abbe, NP       Current Outpatient Medications  Medication Sig Dispense Refill  . ibuprofen (ADVIL,MOTRIN) 600 MG tablet Take 1 tablet (600 mg total) by mouth every 6 (six) hours as needed. (Patient not taking: Reported on 02/25/2018) 30 tablet 0   PTA Medications:  (Not in a hospital admission)  Musculoskeletal: Strength & Muscle Tone: within normal limits Gait & Station: normal Patient leans: N/A  Psychiatric Specialty Exam: Physical Exam  Nursing note and vitals reviewed. Constitutional: He is oriented to person, place, and time. He appears well-developed and well-nourished.  HENT:  Head: Normocephalic and atraumatic.  Neck: Normal range of motion.   Respiratory: Effort normal.  Musculoskeletal: Normal range of motion.  Neurological: He is alert and oriented to person, place, and time.  Psychiatric: His speech is normal and behavior is normal. Thought content normal. His mood appears anxious. Cognition and memory are normal. He expresses impulsivity. He exhibits a depressed mood.    Review of Systems  Psychiatric/Behavioral: Positive for depression and substance abuse. Negative for hallucinations, memory loss and suicidal ideas. The patient is nervous/anxious. The patient does not have insomnia.   All other systems reviewed and are negative.  Blood pressure (!) 152/99, pulse 98, temperature 98.2 F (36.8 C), temperature source Oral, resp. rate 17, SpO2 98 %.There is no height or weight on file to calculate BMI.  General Appearance: Casual  Eye Contact:  Good  Speech:  Clear and Coherent and Normal Rate  Volume:  Normal  Mood:  Anxious and Depressed  Affect:  Congruent and Depressed  Thought Process:  Coherent, Goal Directed, Linear and Descriptions of Associations: Intact  Orientation:  Full (Time, Place, and Person)  Thought Content:  Logical  Suicidal Thoughts:  No  Homicidal Thoughts:  No  Memory:  Immediate;   Good Recent;   Good Remote;   Fair  Judgement:  Fair  Insight:  Fair  Psychomotor Activity:  Normal  Concentration:  Concentration: Good and Attention Span: Good  Recall:  Good  Fund of Knowledge:  Good  Language:  Good  Akathisia:  No  Handed:  Right  AIMS (if indicated):   N/A  Assets:  Architect Housing Social Support  ADL's:  Intact  Cognition:  WNL  Sleep:   N/A     Demographic Factors:  Male, Low socioeconomic status and Unemployed  Loss Factors: Financial problems/change in socioeconomic status  Historical Factors: Impulsivity  Risk Reduction Factors:   Employed  Continued Clinical Symptoms:  Severe Anxiety and/or Agitation Depression:    Impulsivity Alcohol/Substance Abuse/Dependencies  Cognitive Features That Contribute To Risk:  Closed-mindedness    Suicide Risk:  Minimal: No identifiable suicidal ideation.  Patients presenting with no risk factors but with morbid ruminations; may be classified as minimal risk based on the severity of the depressive symptoms   Plan Of Care/Follow-up recommendations:  Activity:  as tolerated Diet:  heart healthy  Disposition: Discharge Home Follow up with the outpatient resources provided to you by Peer support for substance abuse treatment.  Follow up with [facility] or [facility] for substance abuse treatment Avoid the use of alcohol and illicit drugs  Laveda Abbe, NP 02/26/2018, 1:45 PM   Patient seen face-to-face for psychiatric evaluation, chart reviewed and case discussed with the physician extender and developed treatment plan. Reviewed the information documented and agree with the treatment plan.  Juanetta Beets, DO 02/26/18 6:38 PM

## 2018-02-26 NOTE — Patient Outreach (Signed)
CPSS met with the patient to provide substance use recovery support. Patient was still interested in getting connected with substance use detox treatment. CPSS talked to the patient about [facility] who provide spanish speaking translator services along with their detox program, but patient stated [place] was too far. Patient was more interested in [facility] detox program for alcohol. CPSS provided bus passes and bus directions to their treatment facility. CPSS provided information for other recovery resources transcribed in Spanish including information for a Spanish speaking AA meeting that meets four times a week. CPSS also provided CPSS contact information. CPSS strongly encouraged the patient continuously stay in contact with CPSS for further help with recovery resources.

## 2018-02-26 NOTE — Discharge Instructions (Signed)
To help you maintain a sober lifestyle, a substance abuse treatment program may be beneficial to you.  Contact one of the following facilities at your earliest opportunity to ask about enrolling: ° °RESIDENTIAL PROGRAMS: ° °     [facility] °     [address] °     [place], [place] [address] °     [phone] ° °     [facility] °     [address] °     [place], [place] [address] °     [phone] ° °     [facility] °     [address] °     [place], [place] [address] °     [phone] ° °OUTPATIENT PROGRAMS: ° °     [facility] °     [address] °     [place], [place] [address] °     [phone] °     New patients are seen at the walk-in clinic every Tuesday from 9:00 am - 12:00 pm °

## 2018-02-26 NOTE — ED Notes (Signed)
Pt resting in bed with eyes closed. Respirations even and unlabored. No distress noted. Will continue to monitor.

## 2018-02-26 NOTE — ED Notes (Signed)
Pt discharged per order with AVS, belongings, and bus pass. Discharge instructions were reviewed via interpreter. Pt verbalized understanding. Peer support also spoke with pt. Pt was escorted off unit and was in no acute distress.

## 2018-02-26 NOTE — BH Assessment (Signed)
[facility]  Per Juanetta BeetsJacqueline Norman, DO, this pt does not require psychiatric hospitalization at this time.  Pt presents under IVC initiated by law enforcement, which Dr Sharma CovertNorman has rescinded.  [facility] information for area substance abuse treatment providers.  This has been included in pt's discharge instructions.  Pt's nurse, Clydie BraunKaren, has been notified.  Doylene Canninghomas Topanga Alvelo, MA Triage Specialist [phone]

## 2019-04-24 ENCOUNTER — Other Ambulatory Visit: Payer: Self-pay

## 2019-04-24 ENCOUNTER — Ambulatory Visit (HOSPITAL_COMMUNITY)
Admission: EM | Admit: 2019-04-24 | Discharge: 2019-04-24 | Disposition: A | Discharge status: Home / Self Care | Payer: Self-pay | Attending: Psychiatry | Admitting: Psychiatry

## 2019-04-24 DIAGNOSIS — F10231 Alcohol dependence with withdrawal delirium: Principal | ICD-10-CM | POA: Diagnosis present

## 2019-04-24 DIAGNOSIS — R509 Fever, unspecified: Secondary | ICD-10-CM | POA: Diagnosis present

## 2019-04-24 DIAGNOSIS — R41840 Attention and concentration deficit: Secondary | ICD-10-CM | POA: Insufficient documentation

## 2019-04-24 DIAGNOSIS — R5383 Other fatigue: Secondary | ICD-10-CM | POA: Insufficient documentation

## 2019-04-24 DIAGNOSIS — E871 Hypo-osmolality and hyponatremia: Secondary | ICD-10-CM | POA: Diagnosis present

## 2019-04-24 DIAGNOSIS — Z604 Social exclusion and rejection: Secondary | ICD-10-CM | POA: Insufficient documentation

## 2019-04-24 DIAGNOSIS — Z915 Personal history of self-harm: Secondary | ICD-10-CM | POA: Insufficient documentation

## 2019-04-24 DIAGNOSIS — E878 Other disorders of electrolyte and fluid balance, not elsewhere classified: Secondary | ICD-10-CM | POA: Diagnosis present

## 2019-04-24 DIAGNOSIS — R739 Hyperglycemia, unspecified: Secondary | ICD-10-CM | POA: Diagnosis present

## 2019-04-24 DIAGNOSIS — G4700 Insomnia, unspecified: Secondary | ICD-10-CM | POA: Insufficient documentation

## 2019-04-24 DIAGNOSIS — R4582 Worries: Secondary | ICD-10-CM | POA: Insufficient documentation

## 2019-04-24 DIAGNOSIS — D696 Thrombocytopenia, unspecified: Secondary | ICD-10-CM | POA: Diagnosis present

## 2019-04-24 DIAGNOSIS — R4589 Other symptoms and signs involving emotional state: Secondary | ICD-10-CM | POA: Insufficient documentation

## 2019-04-24 DIAGNOSIS — Z20828 Contact with and (suspected) exposure to other viral communicable diseases: Secondary | ICD-10-CM | POA: Diagnosis present

## 2019-04-24 DIAGNOSIS — R05 Cough: Secondary | ICD-10-CM | POA: Diagnosis present

## 2019-04-24 DIAGNOSIS — E876 Hypokalemia: Secondary | ICD-10-CM | POA: Diagnosis present

## 2019-04-24 DIAGNOSIS — F1024 Alcohol dependence with alcohol-induced mood disorder: Secondary | ICD-10-CM | POA: Insufficient documentation

## 2019-04-24 DIAGNOSIS — F17210 Nicotine dependence, cigarettes, uncomplicated: Secondary | ICD-10-CM | POA: Diagnosis present

## 2019-04-24 DIAGNOSIS — F1421 Cocaine dependence, in remission: Secondary | ICD-10-CM | POA: Insufficient documentation

## 2019-04-24 DIAGNOSIS — K7010 Alcoholic hepatitis without ascites: Secondary | ICD-10-CM | POA: Diagnosis present

## 2019-04-24 NOTE — ED Triage Notes (Signed)
Pt presents by [facility] for alcohol intoxication and fever/cough. Pt unsure if currently has COVID-19. Pt reported body aches, fever, cough for a week.

## 2019-04-25 ENCOUNTER — Other Ambulatory Visit: Payer: Self-pay

## 2019-04-25 ENCOUNTER — Emergency Department (HOSPITAL_COMMUNITY): Payer: Self-pay

## 2019-04-25 ENCOUNTER — Inpatient Hospital Stay (HOSPITAL_COMMUNITY)
Admission: EM | Admit: 2019-04-25 | Discharge: 2019-05-01 | DRG: 897 | Disposition: A | Discharge status: Home / Self Care | Payer: Self-pay | Attending: Internal Medicine | Admitting: Internal Medicine

## 2019-04-25 ENCOUNTER — Encounter (HOSPITAL_COMMUNITY): Payer: Self-pay | Admitting: Emergency Medicine

## 2019-04-25 DIAGNOSIS — F10939 Alcohol use, unspecified with withdrawal, unspecified: Secondary | ICD-10-CM | POA: Diagnosis present

## 2019-04-25 DIAGNOSIS — R7989 Other specified abnormal findings of blood chemistry: Secondary | ICD-10-CM

## 2019-04-25 DIAGNOSIS — F10231 Alcohol dependence with withdrawal delirium: Secondary | ICD-10-CM

## 2019-04-25 DIAGNOSIS — F10931 Alcohol use, unspecified with withdrawal delirium: Secondary | ICD-10-CM

## 2019-04-25 DIAGNOSIS — R945 Abnormal results of liver function studies: Secondary | ICD-10-CM

## 2019-04-25 DIAGNOSIS — F10239 Alcohol dependence with withdrawal, unspecified: Secondary | ICD-10-CM | POA: Diagnosis present

## 2019-04-25 DIAGNOSIS — F10230 Alcohol dependence with withdrawal, uncomplicated: Secondary | ICD-10-CM

## 2019-04-25 LAB — COMPREHENSIVE METABOLIC PANEL
ALT: 174 U/L — ABNORMAL HIGH (ref 0–44)
AST: 314 U/L — ABNORMAL HIGH (ref 15–41)
Albumin: 3.6 g/dL (ref 3.5–5.0)
Alkaline Phosphatase: 93 U/L (ref 38–126)
Anion gap: 15 (ref 5–15)
BUN: 13 mg/dL (ref 6–20)
CO2: 24 mmol/L (ref 22–32)
Calcium: 9.4 mg/dL (ref 8.9–10.3)
Chloride: 96 mmol/L — ABNORMAL LOW (ref 98–111)
Creatinine, Ser: 0.56 mg/dL — ABNORMAL LOW (ref 0.61–1.24)
GFR calc Af Amer: >60 mL/min (ref >60)
GFR calc non Af Amer: >60 mL/min (ref >60)
Glucose, Bld: 107 mg/dL — ABNORMAL HIGH (ref 70–99)
Potassium: 3.2 mmol/L — ABNORMAL LOW (ref 3.5–5.1)
Sodium: 135 mmol/L (ref 135–145)
Total Bilirubin: 1.1 mg/dL (ref 0.3–1.2)
Total Protein: 8 g/dL (ref 6.5–8.1)

## 2019-04-25 LAB — CBC WITH DIFFERENTIAL/PLATELET
Abs Immature Granulocytes: 0.11 10*3/uL — ABNORMAL HIGH (ref 0.00–0.07)
Basophils Absolute: 0.1 10*3/uL (ref 0.0–0.1)
Basophils Relative: 1 %
Eosinophils Absolute: 0.2 10*3/uL (ref 0.0–0.5)
Eosinophils Relative: 2 %
HCT: 40.4 % (ref 39.0–52.0)
Hemoglobin: 13.8 g/dL (ref 13.0–17.0)
Immature Granulocytes: 1 %
Lymphocytes Relative: 14 %
Lymphs Abs: 1.3 10*3/uL (ref 0.7–4.0)
MCH: 32.2 pg (ref 26.0–34.0)
MCHC: 34.2 g/dL (ref 30.0–36.0)
MCV: 94.4 fL (ref 80.0–100.0)
Monocytes Absolute: 1.6 10*3/uL — ABNORMAL HIGH (ref 0.1–1.0)
Monocytes Relative: 18 %
Neutro Abs: 5.8 10*3/uL (ref 1.7–7.7)
Neutrophils Relative %: 64 %
Platelets: 72 10*3/uL — ABNORMAL LOW (ref 150–400)
RBC: 4.28 MIL/uL (ref 4.22–5.81)
RDW: 12.7 % (ref 11.5–15.5)
WBC: 9 10*3/uL (ref 4.0–10.5)
nRBC: 0 % (ref 0.0–0.2)

## 2019-04-25 LAB — RAPID URINE DRUG SCREEN, HOSP PERFORMED
Amphetamines: NOT DETECTED
Barbiturates: NOT DETECTED
Benzodiazepines: NOT DETECTED
Cocaine: NOT DETECTED
Opiates: NOT DETECTED
Tetrahydrocannabinol: NOT DETECTED

## 2019-04-25 LAB — URINALYSIS, ROUTINE W REFLEX MICROSCOPIC
Bacteria, UA: NONE SEEN
Bilirubin Urine: NEGATIVE
Glucose, UA: NEGATIVE mg/dL
Ketones, ur: 5 mg/dL — AB
Leukocytes,Ua: NEGATIVE
Nitrite: NEGATIVE
Protein, ur: 100 mg/dL — AB
Specific Gravity, Urine: 1.012 (ref 1.005–1.030)
pH: 6 (ref 5.0–8.0)

## 2019-04-25 LAB — SARS CORONAVIRUS 2 BY RT PCR (HOSPITAL ORDER, PERFORMED IN ~~LOC~~ HOSPITAL LAB): SARS Coronavirus 2: NEGATIVE

## 2019-04-25 LAB — MRSA PCR SCREENING: MRSA by PCR: NEGATIVE

## 2019-04-25 LAB — LACTIC ACID, PLASMA: Lactic Acid, Venous: 1.2 mmol/L (ref 0.5–1.9)

## 2019-04-25 LAB — ETHANOL: Alcohol, Ethyl (B): <10 mg/dL (ref <10)

## 2019-04-25 MED ORDER — SODIUM CHLORIDE 0.9 % IV BOLUS
2000.0000 mL | Freq: Once | INTRAVENOUS | Status: AC
  Administered 2019-04-25: 2000 mL via INTRAVENOUS

## 2019-04-25 MED ORDER — POTASSIUM CHLORIDE 10 MEQ/100ML IV SOLN
10.0000 meq | INTRAVENOUS | Status: AC
  Administered 2019-04-25 (×4): 10 meq via INTRAVENOUS
  Filled 2019-04-25 (×4): qty 100

## 2019-04-25 MED ORDER — LORAZEPAM 2 MG/ML IJ SOLN: 0.0000 mg | Freq: Two times a day (BID) | INTRAMUSCULAR | Status: DC

## 2019-04-25 MED ORDER — LORAZEPAM 1 MG PO TABS: 0.0000 mg | ORAL_TABLET | Freq: Two times a day (BID) | ORAL | Status: DC

## 2019-04-25 MED ORDER — LORAZEPAM 2 MG/ML IJ SOLN
INTRAMUSCULAR | Status: AC
  Administered 2019-04-25: 02:00:00 2 mg via INTRAVENOUS
  Filled 2019-04-25: qty 2

## 2019-04-25 MED ORDER — CHLORHEXIDINE GLUCONATE CLOTH 2 % EX PADS
6.0000 | MEDICATED_PAD | Freq: Every day | CUTANEOUS | Status: DC
  Administered 2019-04-25 – 2019-04-28 (×4): 6 via TOPICAL

## 2019-04-25 MED ORDER — ENOXAPARIN SODIUM 40 MG/0.4ML ~~LOC~~ SOLN
40.0000 mg | Freq: Every day | SUBCUTANEOUS | Status: DC
  Administered 2019-04-25 – 2019-05-01 (×6): 40 mg via SUBCUTANEOUS
  Filled 2019-04-25 (×6): qty 0.4

## 2019-04-25 MED ORDER — THIAMINE HCL 100 MG/ML IJ SOLN
100.0000 mg | Freq: Every day | INTRAMUSCULAR | Status: DC
  Administered 2019-04-25: 10:00:00 100 mg via INTRAVENOUS
  Filled 2019-04-25: qty 2

## 2019-04-25 MED ORDER — LORAZEPAM 2 MG/ML IJ SOLN
0.0000 mg | Freq: Four times a day (QID) | INTRAMUSCULAR | Status: DC
  Administered 2019-04-25: 2 mg via INTRAVENOUS
  Administered 2019-04-25: 4 mg via INTRAVENOUS
  Filled 2019-04-25: qty 1
  Filled 2019-04-25: qty 2

## 2019-04-25 MED ORDER — VITAMIN B-1 100 MG PO TABS
100.0000 mg | ORAL_TABLET | Freq: Every day | ORAL | Status: DC
  Administered 2019-04-26 – 2019-04-27 (×2): 100 mg via ORAL
  Filled 2019-04-25 (×2): qty 1

## 2019-04-25 MED ORDER — SODIUM CHLORIDE 0.9 % IV BOLUS
2000.0000 mL | Freq: Once | INTRAVENOUS | Status: AC
  Administered 2019-04-25: 03:00:00 2000 mL via INTRAVENOUS

## 2019-04-25 MED ORDER — LORAZEPAM 1 MG PO TABS
0.0000 mg | ORAL_TABLET | Freq: Four times a day (QID) | ORAL | Status: DC
  Administered 2019-04-26 (×3): 2 mg via ORAL
  Filled 2019-04-25 (×3): qty 2

## 2019-04-25 MED ORDER — CHLORDIAZEPOXIDE HCL 5 MG PO CAPS
5.0000 mg | ORAL_CAPSULE | Freq: Three times a day (TID) | ORAL | Status: DC
  Administered 2019-04-25 – 2019-04-27 (×5): 5 mg via ORAL
  Filled 2019-04-25 (×6): qty 1

## 2019-04-25 MED ORDER — DEXMEDETOMIDINE HCL IN NACL 200 MCG/50ML IV SOLN
0.4000 ug/kg/h | INTRAVENOUS | Status: AC
  Administered 2019-04-25: 0.5 ug/kg/h via INTRAVENOUS
  Administered 2019-04-25: 20:00:00 0.7 ug/kg/h via INTRAVENOUS
  Administered 2019-04-25: 0.4 ug/kg/h via INTRAVENOUS
  Administered 2019-04-26: 0.7 ug/kg/h via INTRAVENOUS
  Administered 2019-04-26: 0.6 ug/kg/h via INTRAVENOUS
  Filled 2019-04-25 (×7): qty 50

## 2019-04-25 MED ORDER — SODIUM CHLORIDE 0.9% FLUSH
3.0000 mL | Freq: Once | INTRAVENOUS | Status: AC
  Administered 2019-04-25: 3 mL via INTRAVENOUS

## 2019-04-25 NOTE — ED Notes (Signed)
ED TO INPATIENT HANDOFF REPORT  Name/Age/Gender Vincent Peters 38 y.o. male  Code Status    Code Status Orders  (From admission, onward)         Start     Ordered   04/25/19 0417  Full code  Continuous     04/25/19 0416        Code Status History    Date Active Date Inactive Code Status Order ID Comments User Context   02/25/2018 1247 02/26/2018 1818 Full Code [id number]  Maxwell Caul, PA-C ED   02/10/2014 1930 02/13/2014 2247 Full Code [id number]  Alison Murray, MD Inpatient   02/10/2014 1507 02/10/2014 1930 Full Code [id number]  Samuel Jester, DO ED   02/10/2014 1227 02/10/2014 1507 Full Code [id number]  Samuel Jester, DO ED   Advance Care Planning Activity      Home/SNF/Other Home  Chief Complaint Fever; Alcohol Intoxication  Level of Care/Admitting Diagnosis ED Disposition    ED Disposition Condition Comment   Admit  [facility]ospital Area[facility] [[id number]]  Level of Care: ICU [6]  Covid Evaluation: Confirmed COVID Negative  Diagnosis: Alcohol withdrawal (HCC) [291.81.ICD-9-CM]  Admitting Physician: Norman Clay [[id number]]  Attending Physician: Norman Clay [[id number]]  Estimated length of stay: past midnight tomorrow  Certification:: I certify this patient will need inpatient services for at least 2 midnights  PT Class (Do Not Modify): Inpatient [101]  PT Acc Code (Do Not Modify): Private [1]       Medical History Past Medical History:  Diagnosis Date  . Alcohol abuse   . Cocaine abuse (HCC)     Allergies No Known Allergies  IV Location/Drains/Wounds Patient Lines/Drains/Airways Status   Active Line/Drains/Airways    Name:   Placement date:   Placement time:   Site:   Days:   Peripheral IV 04/25/19 Left;Upper Forearm   04/25/19    0139    Forearm   less than 1   Peripheral IV 04/25/19 Right Forearm   04/25/19    0151    Forearm   less than 1   External Urinary Catheter   04/25/19    0308    -   less than 1           Labs/Imaging Results for orders placed or performed during the hospital encounter of 04/25/19 (from the past 48 hour(s))  Lactic acid, plasma     Status: None   Collection Time: 04/25/19 12:28 AM  Result Value Ref Range   Lactic Acid, Venous 1.2 0.5 - 1.9 mmol/L    Comment: Performed at [facility], [address] [place], [place] [address]  Comprehensive metabolic panel     Status: Abnormal   Collection Time: 04/25/19 12:28 AM  Result Value Ref Range   Sodium 135 135 - 145 mmol/L   Potassium 3.2 (L) 3.5 - 5.1 mmol/L   Chloride 96 (L) 98 - 111 mmol/L   CO2 24 22 - 32 mmol/L   Glucose, Bld 107 (H) 70 - 99 mg/dL   BUN 13 6 - 20 mg/dL   Creatinine, Ser 6.38 (L) 0.61 - 1.24 mg/dL   Calcium 9.4 8.9 - 93.7 mg/dL   Total Protein 8.0 6.5 - 8.1 g/dL   Albumin 3.6 3.5 - 5.0 g/dL   AST 342 (H) 15 - 41 U/L   ALT 174 (H) 0 - 44 U/L   Alkaline Phosphatase 93 38 - 126 U/L   Total Bilirubin 1.1 0.3 - 1.2  mg/dL   GFR calc non Af Amer >60 >60 mL/min   GFR calc Af Amer >60 >60 mL/min   Anion gap 15 5 - 15    Comment: Performed at [facility], [address], [place], [place] [address]  CBC with Differential     Status: Abnormal   Collection Time: 04/25/19 12:28 AM  Result Value Ref Range   WBC 9.0 4.0 - 10.5 K/uL   RBC 4.28 4.22 - 5.81 MIL/uL   Hemoglobin 13.8 13.0 - 17.0 g/dL   HCT 60.4 54.0 - 98.1 %   MCV 94.4 80.0 - 100.0 fL   MCH 32.2 26.0 - 34.0 pg   MCHC 34.2 30.0 - 36.0 g/dL   RDW 19.1 47.8 - 29.5 %   Platelets 72 (L) 150 - 400 K/uL    Comment: REPEATED TO VERIFY PLATELET COUNT CONFIRMED BY SMEAR SPECIMEN CHECKED FOR CLOTS Immature Platelet Fraction may be clinically indicated, consider ordering this additional test [id number]    nRBC 0.0 0.0 - 0.2 %   Neutrophils Relative % 64 %   Neutro Abs 5.8 1.7 - 7.7 K/uL   Lymphocytes Relative 14 %   Lymphs Abs 1.3 0.7 - 4.0 K/uL   Monocytes Relative 18 %   Monocytes Absolute 1.6 (H)  0.1 - 1.0 K/uL   Eosinophils Relative 2 %   Eosinophils Absolute 0.2 0.0 - 0.5 K/uL   Basophils Relative 1 %   Basophils Absolute 0.1 0.0 - 0.1 K/uL   Immature Granulocytes 1 %   Abs Immature Granulocytes 0.11 (H) 0.00 - 0.07 K/uL    Comment: Performed at [facility], [address], [place], [place] [address]  Ethanol     Status: None   Collection Time: 04/25/19 12:35 AM  Result Value Ref Range   Alcohol, Ethyl (B) <10 <10 mg/dL    Comment: (NOTE) Lowest detectable limit for serum alcohol is 10 mg/dL. For medical purposes only. Performed at [facility], [address] [place], [place] [address]   Urinalysis, Routine w reflex microscopic     Status: Abnormal   Collection Time: 04/25/19  1:41 AM  Result Value Ref Range   Color, Urine YELLOW YELLOW   APPearance CLEAR CLEAR   Specific Gravity, Urine 1.012 1.005 - 1.030   pH 6.0 5.0 - 8.0   Glucose, UA NEGATIVE NEGATIVE mg/dL   Hgb urine dipstick MODERATE (A) NEGATIVE   Bilirubin Urine NEGATIVE NEGATIVE   Ketones, ur 5 (A) NEGATIVE mg/dL   Protein, ur 841 (A) NEGATIVE mg/dL   Nitrite NEGATIVE NEGATIVE   Leukocytes,Ua NEGATIVE NEGATIVE   RBC / HPF 0-5 0 - 5 RBC/hpf   WBC, UA 0-5 0 - 5 WBC/hpf   Bacteria, UA NONE SEEN NONE SEEN   Mucus PRESENT     Comment: Performed at [facility], [address] [place], [place] [address]  Rapid urine drug screen (hospital performed)     Status: None   Collection Time: 04/25/19  1:41 AM  Result Value Ref Range   Opiates NONE DETECTED NONE DETECTED   Cocaine NONE DETECTED NONE DETECTED   Benzodiazepines NONE DETECTED NONE DETECTED   Amphetamines NONE DETECTED NONE DETECTED   Tetrahydrocannabinol NONE DETECTED NONE DETECTED   Barbiturates NONE DETECTED NONE DETECTED    Comment: (NOTE) DRUG SCREEN FOR MEDICAL PURPOSES ONLY.  IF CONFIRMATION IS NEEDED FOR ANY PURPOSE, NOTIFY LAB WITHIN 5 DAYS. LOWEST DETECTABLE LIMITS FOR URINE  DRUG SCREEN Drug Class  Cutoff (ng/mL) Amphetamine and metabolites    1000 Barbiturate and metabolites    200 Benzodiazepine                 200 Tricyclics and metabolites     300 Opiates and metabolites        300 Cocaine and metabolites        300 THC                            50 Performed at [facility], [address] [place], [place] [address]   SARS Coronavirus 2 [facility](Hospital order, Performed in [facility] lab) Nasopharyngeal Nasopharyngeal Swab     Status: None   Collection Time: 04/25/19  1:41 AM   Specimen: Nasopharyngeal Swab  Result Value Ref Range   SARS Coronavirus 2 NEGATIVE NEGATIVE    Comment: (NOTE) If result is NEGATIVE SARS-CoV-2 target nucleic acids are NOT DETECTED. The SARS-CoV-2 RNA is generally detectable in upper and lower  respiratory specimens during the acute phase of infection. The lowest  concentration of SARS-CoV-2 viral copies this assay can detect is 250  copies / mL. A negative result does not preclude SARS-CoV-2 infection  and should not be used as the sole basis for treatment or other  patient management decisions.  A negative result may occur with  improper specimen collection / handling, submission of specimen other  than nasopharyngeal swab, presence of viral mutation(s) within the  areas targeted by this assay, and inadequate number of viral copies  (<250 copies / mL). A negative result must be combined with clinical  observations, patient history, and epidemiological information. If result is POSITIVE SARS-CoV-2 target nucleic acids are DETECTED. The SARS-CoV-2 RNA is generally detectable in upper and lower  respiratory specimens dur ing the acute phase of infection.  Positive  results are indicative of active infection with SARS-CoV-2.  Clinical  correlation with patient history and other diagnostic information is  necessary to determine patient infection status.  Positive results do  not  rule out bacterial infection or co-infection with other viruses. If result is PRESUMPTIVE POSTIVE SARS-CoV-2 nucleic acids MAY BE PRESENT.   A presumptive positive result was obtained on the submitted specimen  and confirmed on repeat testing.  While 2019 novel coronavirus  (SARS-CoV-2) nucleic acids may be present in the submitted sample  additional confirmatory testing may be necessary for epidemiological  and / or clinical management purposes  to differentiate between  SARS-CoV-2 and other Sarbecovirus currently known to infect humans.  If clinically indicated additional testing with an alternate test  methodology [phone]([id number]) is advised. The SARS-CoV-2 RNA is generally  detectable in upper and lower respiratory sp ecimens during the acute  phase of infection. The expected result is Negative. Fact Sheet for Patients:  BoilerBrush.com.cyhttps://www.fda.gov/media/136312/download Fact Sheet for Healthcare Providers: https://pope.com/https://www.fda.gov/media/136313/download This test is not yet approved or cleared by the [place] FDA and has been authorized for detection and/or diagnosis of SARS-CoV-2 by FDA under an Emergency Use Authorization (EUA).  This EUA will remain in effect (meaning this test can be used) for the duration of the COVID-19 declaration under Section 564(b)(1) of the Act, 21 U.S.C. section 360bbb-3(b)(1), unless the authorization is terminated or revoked sooner. Performed at [facility], [address] [place], [place] [address]    Dg Chest Portable 1 View  Result Date: 04/25/2019 CLINICAL DATA:  Cough and fever EXAM: PORTABLE CHEST 1 VIEW  COMPARISON:  01/17/2014 FINDINGS: The heart size and mediastinal contours are within normal limits. Both lungs are clear. The visualized skeletal structures are unremarkable. IMPRESSION: No active disease. Electronically Signed   By: Donavan Foil M.D.   On: 04/25/2019 01:04    Pending Labs Unresulted Labs (From admission, onward)     Start     Ordered   05/02/19 0500  Creatinine, serum  (enoxaparin (LOVENOX)    CrCl >/= 30 ml/min)  Weekly,   R    Comments: while on enoxaparin therapy    04/25/19 0416   04/25/19 0415  HIV antibody (Routine Testing)  Once,   STAT     04/25/19 0416   04/25/19 0021  Lactic acid, plasma  Now then every 2 hours,   STAT     04/25/19 0021          Vitals/Pain Today's Vitals   04/25/19 0415 04/25/19 0430 04/25/19 0445 04/25/19 0500  BP: 138/87 138/88 (!) 136/95 (!) 151/100  Pulse: 85 86 83 83  Resp: 12 16 13 13   Temp:      TempSrc:      SpO2: 98% 94% 97% 95%  Weight:      Height:        Isolation Precautions No active isolations  Medications Medications  LORazepam (ATIVAN) injection 0-4 mg (2 mg Intravenous Given 04/25/19 0216)    Or  LORazepam (ATIVAN) tablet 0-4 mg ( Oral See Alternative 04/25/19 0216)  LORazepam (ATIVAN) injection 0-4 mg (has no administration in time range)    Or  LORazepam (ATIVAN) tablet 0-4 mg (has no administration in time range)  thiamine (VITAMIN B-1) tablet 100 mg (has no administration in time range)    Or  thiamine (B-1) injection 100 mg (has no administration in time range)  dexmedetomidine (PRECEDEX) 200 MCG/50ML (4 mcg/mL) infusion (0.6 mcg/kg/hr  61.2 kg Intravenous Rate/Dose Verify 04/25/19 0510)  enoxaparin (LOVENOX) injection 40 mg (has no administration in time range)  sodium chloride flush (NS) 0.9 % injection 3 mL (3 mLs Intravenous Given 04/25/19 0033)  sodium chloride 0.9 % bolus 2,000 mL (0 mLs Intravenous Stopped 04/25/19 0248)  LORazepam (ATIVAN) 2 MG/ML injection (2 mg Intravenous Given 04/25/19 0216)  sodium chloride 0.9 % bolus 2,000 mL (0 mLs Intravenous Stopped 04/25/19 0448)    Mobility walks

## 2019-04-25 NOTE — H&P (Signed)
Vincent Peters is an 38 y.o. male.  Total Time spent with patient: 20 minutes  Psychiatric Specialty Exam: Physical Exam  Constitutional: He is oriented to person, place, and time. He appears well-developed and well-nourished.  HENT:  Head: Normocephalic and atraumatic.  Right Ear: External ear normal.  Left Ear: External ear normal.  Eyes: Right eye exhibits no discharge. Left eye exhibits no discharge.  Respiratory: Effort normal. No respiratory distress.  Neurological: He is alert and oriented to person, place, and time.  Skin: He is not diaphoretic.    Review of Systems  Constitutional: Positive for chills, fever and malaise/fatigue. Negative for diaphoresis and weight loss.  Respiratory: Positive for cough and shortness of breath.   Cardiovascular: Negative for chest pain.  Gastrointestinal: Negative for diarrhea, nausea and vomiting.    There were no vitals taken for this visit.There is no height or weight on file to calculate BMI.  General Appearance: Disheveled  Eye Contact:  Minimal  Speech:  Slurred  Volume:  Decreased  Mood:  Anxious, Depressed and Worthless  Affect:  Depressed  Thought Process:  Coherent  Orientation:  Full (Time, Place, and Person)  Thought Content:  Hallucinations: Auditory  Suicidal Thoughts:  Yes.  without intent/plan  Homicidal Thoughts:  No  Memory:  Immediate;   Fair Recent;   Fair  Judgement:  Impaired  Insight:  Lacking  Psychomotor Activity:  Decreased  Concentration: Concentration: Poor and Attention Span: Poor  Recall:  [place]: Fair  Akathisia:  Negative  Handed:  Right  AIMS (if indicated):     Assets:  Desire for Improvement Housing Physical Health  Sleep:       Musculoskeletal: Strength & Muscle Tone: within normal limits Gait & Station: unsteady Patient leans: N/A  There were no vitals taken for this  visit.  Recommendations:  Based on my evaluation the patient appears to have an emergency medical condition for which I recommend the patient be transferred to the emergency department for further evaluation.   Patient is tremulous, speech is slurred, gait is unsteady. Reports that he has one 40 ounce beer today. Reports that he was diagnosed with COVID yesterday.  Rozetta Nunnery, NP 04/25/2019, 2:19 AM

## 2019-04-25 NOTE — H&P (Signed)
NAMETyjon Bowen, MRN:  [id number], DOB:  1980/08/22, LOS: 0 ADMISSION DATE:  04/25/2019, CONSULTATION DATE: 04/25/2019 REFERRING MD: ED, CHIEF COMPLAINT: Alcohol withdrawal  Brief History   Patient brought to the emergency room for evaluation of fever cough  History of present illness   Patient is a 38 year old male with a history of alcohol and cocaine abuse.  Was brought to the emergency room after being seen at behavioral health for admission evaluation and telling them that he thought he was COVID positive.  He is COVID negative fever.  He was sent here for evaluation of fever and cough.  Initial temperature was 100.5 Fahrenheit although she has defervesced since admission to the emergency room.  Chest x-ray was clear.  White count was 9.  He has become progressively combative while in the emergency room presumably secondary to ETOH withdrawal with a less than 10 alcohol level and urine drug screen negative for cocaine.  He became progressively combative in the emergency room following IV and was started on Precedex drip.  On my evaluation patient is calm sedate.  Past Medical History  Cocaine and alcohol abuse  Significant Hospital Events   NA  Consults:  NA  Procedures:  NA  Significant Diagnostic Tests:  NA  Micro Data:  NA  Antimicrobials:  NA  Interim history/subjective:  NA  Objective   Blood pressure (!) 171/98, pulse (!) 117, temperature 98.2 F (36.8 C), temperature source Oral, resp. rate (!) 24, height 5\' 5"  (1.651 m), weight 61.2 kg, SpO2 96 %.        Intake/Output Summary (Last 24 hours) at 04/25/2019 0325 Last data filed at 04/25/2019 04/27/2019 Gross per 24 hour  Intake 2005.13 ml  Output -  Net 2005.13 ml   Filed Weights   04/25/19 0019  Weight: 61.2 kg    Examination: General: Male in no distress HENT: Within normal limits Lungs: Clear Cardiovascular: Regular Abdomen: Benign Extremities: Within normal limits Neuro:  Nonfocal GU: Within normal limits  Resolved Hospital Problem list   NA  Assessment & Plan:  1.  Delirium tremens as a result of alcohol withdrawal: We will admit to the ICU-8 Precedex with tapering Ativan dose watch for further fever.  Best practice:  Diet: N.p.o. Pain/Anxiety/Delirium protocol (if indicated): Precedex raised p.m. taper VAP protocol (if indicated): N/A DVT prophylaxis: Lovenox GI prophylaxis: N/A Glucose control: NA Mobility: Bedrest Code Status: Full Family Communication: Not available Disposition: To ICU for DT treatment  Labs   CBC: Recent Labs  Lab 04/25/19 0028  WBC 9.0  NEUTROABS 5.8  HGB 13.8  HCT 40.4  MCV 94.4  PLT 72*    Basic Metabolic Panel: Recent Labs  Lab 04/25/19 0028  NA 135  K 3.2*  CL 96*  CO2 24  GLUCOSE 107*  BUN 13  CREATININE 0.56*  CALCIUM 9.4   GFR: Estimated Creatinine Clearance: 110.5 mL/min (A) (by C-G formula based on SCr of 0.56 mg/dL (L)). Recent Labs  Lab 04/25/19 0028  WBC 9.0  LATICACIDVEN 1.2    Liver Function Tests: Recent Labs  Lab 04/25/19 0028  AST 314*  ALT 174*  ALKPHOS 93  BILITOT 1.1  PROT 8.0  ALBUMIN 3.6   No results for input(s): LIPASE, AMYLASE in the last 168 hours. No results for input(s): AMMONIA in the last 168 hours.  ABG No results found for: PHART, PCO2ART, PO2ART, HCO3, TCO2, ACIDBASEDEF, O2SAT   Coagulation Profile: No results for input(s): INR, PROTIME in the last 168  hours.  Cardiac Enzymes: No results for input(s): CKTOTAL, CKMB, CKMBINDEX, TROPONINI in the last 168 hours.  HbA1C: No results found for: HGBA1C  CBG: No results for input(s): GLUCAP in the last 168 hours.  Review of Systems:   Unable to obtain  Past Medical History  He,  has a past medical history of Alcohol abuse and Cocaine abuse ([place]).   Surgical History   History reviewed. No pertinent surgical history.   Social History   reports that he has been smoking cigarettes. He has never  used smokeless tobacco. He reports current alcohol use of about 168.0 standard drinks of alcohol per week. He reports current drug use. Drug: Cocaine.   Family History   His family history is not on file.   Allergies No Known Allergies   Home Medications  Prior to Admission medications   Medication Sig Start Date End Date Taking? Authorizing Provider  ibuprofen (ADVIL,MOTRIN) 600 MG tablet Take 1 tablet (600 mg total) by mouth every 6 (six) hours as needed. Patient not taking: Reported on 02/25/2018 01/17/14   Julianne Rice, MD     Critical care time:35 minutes spent in bed side evaluation, chart review, discussing ED course with care givers and critical care planning.

## 2019-04-25 NOTE — Progress Notes (Signed)
PCCM:  38 year old gentleman history of alcohol and cocaine abuse.  Concern for alcohol withdrawal admitted to the intensive care unit.  Please see H&P documented this morning.  Patient was placed on Precedex drip after being found combative.  Patient resting comfortably.  Low stimulation. Continue Precedex drip for now. Once able to take p.o. consider low-dose Librium taper. Continue Ativan as needed with CIWA protocol.  [place] [facility] 04/25/2019 9:41 AM

## 2019-04-25 NOTE — ED Provider Notes (Signed)
[facility]-EMERGENCY DEPT Provider Note   CSN: [id number] Arrival date & time: 04/24/19  2351     History   Chief Complaint Chief Complaint  Patient presents with  . Fever  . Alcohol Intoxication    HPI Vincent Peters is a 38 y.o. male.     Patient to ED via EMS, altered, unable to contribute to history. Interpreter was used to try to determine his symptoms but the patient is confused, cannot provide answers that make any sense. Per EMS[facility] was sent here for complaint of fever, cough and body aches for the past week, concern for COVID-19.   The history is provided by the EMS personnel. A language interpreter was used.    Past Medical History:  Diagnosis Date  . Alcohol abuse   . Cocaine abuse [facility](HCC)     Patient Active Problem List   Diagnosis Date Noted  . Alcohol abuse with alcohol-induced mood disorder (HCC) 02/26/2018  . Alcohol withdrawal (HCC) 02/10/2014  . Hypokalemia 02/10/2014  . Thrombocytopenia (HCC) 02/10/2014  . Hypercalcemia 02/10/2014  . Delirium tremens (HCC) 02/10/2014    History reviewed. No pertinent surgical history.      Home Medications    Prior to Admission medications   Medication Sig Start Date End Date Taking? Authorizing Provider  ibuprofen (ADVIL,MOTRIN) 600 MG tablet Take 1 tablet (600 mg total) by mouth every 6 (six) hours as needed. Patient not taking: Reported on 02/25/2018 01/17/14   Loren RacerYelverton, David, MD    Family History History reviewed. No pertinent family history.  Social History Social History   Tobacco Use  . Smoking status: Light Tobacco Smoker    Types: Cigarettes  . Smokeless tobacco: Never Used  Substance Use Topics  . Alcohol use: Yes    Alcohol/week: 168.0 standard drinks    Types: 168 Cans of beer per week  . Drug use: Yes    Types: Cocaine     Allergies   Patient has no known allergies.   Review of Systems Review of  Systems  Unable to perform ROS: Mental status change     Physical Exam Updated Vital Signs BP (!) 145/89   Pulse 90   Temp 98.2 F (36.8 C) (Oral)   Resp 15   Ht 5\' 5"  (1.651 m)   Wt 61.2 kg   SpO2 92%   BMI 22.47 kg/m   Physical Exam Vitals signs and nursing note reviewed.  Constitutional:      Appearance: He is well-developed. He is not toxic-appearing.  HENT:     Head: Normocephalic and atraumatic.  Neck:     Musculoskeletal: Normal range of motion and neck supple.  Cardiovascular:     Rate and Rhythm: Regular rhythm. Tachycardia present.  Pulmonary:     Effort: Pulmonary effort is normal.     Breath sounds: Normal breath sounds. No wheezing, rhonchi or rales.  Abdominal:     General: Bowel sounds are normal.     Palpations: Abdomen is soft.     Tenderness: There is no abdominal tenderness. There is no guarding or rebound.  Musculoskeletal: Normal range of motion.  Skin:    General: Skin is warm and dry.  Neurological:     Mental Status: He is alert.     Comments: Tremors present. Patient is confused, altered, disoriented.       ED Treatments / Results  Labs (all labs ordered are listed, but only abnormal results are  displayed) Labs Reviewed  COMPREHENSIVE METABOLIC PANEL - Abnormal; Notable for the following components:      Result Value   Potassium 3.2 (*)    Chloride 96 (*)    Glucose, Bld 107 (*)    Creatinine, Ser 0.56 (*)    AST 314 (*)    ALT 174 (*)    All other components within normal limits  CBC WITH DIFFERENTIAL/PLATELET - Abnormal; Notable for the following components:   Platelets 72 (*)    Monocytes Absolute 1.6 (*)    Abs Immature Granulocytes 0.11 (*)    All other components within normal limits  URINALYSIS, ROUTINE W REFLEX MICROSCOPIC - Abnormal; Notable for the following components:   Hgb urine dipstick MODERATE (*)    Ketones, ur 5 (*)    Protein, ur 100 (*)    All other components within normal limits  SARS CORONAVIRUS 2  (HOSPITAL ORDER, PERFORMED IN [facility])  LACTIC ACID, PLASMA  ETHANOL  RAPID URINE DRUG SCREEN, HOSP PERFORMED  LACTIC ACID, PLASMA  HIV ANTIBODY (ROUTINE TESTING W REFLEX)   Results for orders placed or performed during the hospital encounter of 04/25/19  SARS Coronavirus 2 [facility] order, Performed in [facility] hospital lab) Nasopharyngeal Nasopharyngeal Swab   Specimen: Nasopharyngeal Swab  Result Value Ref Range   SARS Coronavirus 2 NEGATIVE NEGATIVE  Lactic acid, plasma  Result Value Ref Range   Lactic Acid, Venous 1.2 0.5 - 1.9 mmol/L  Comprehensive metabolic panel  Result Value Ref Range   Sodium 135 135 - 145 mmol/L   Potassium 3.2 (L) 3.5 - 5.1 mmol/L   Chloride 96 (L) 98 - 111 mmol/L   CO2 24 22 - 32 mmol/L   Glucose, Bld 107 (H) 70 - 99 mg/dL   BUN 13 6 - 20 mg/dL   Creatinine, Ser 7.78 (L) 0.61 - 1.24 mg/dL   Calcium 9.4 8.9 - 24.2 mg/dL   Total Protein 8.0 6.5 - 8.1 g/dL   Albumin 3.6 3.5 - 5.0 g/dL   AST 353 (H) 15 - 41 U/L   ALT 174 (H) 0 - 44 U/L   Alkaline Phosphatase 93 38 - 126 U/L   Total Bilirubin 1.1 0.3 - 1.2 mg/dL   GFR calc non Af Amer >60 >60 mL/min   GFR calc Af Amer >60 >60 mL/min   Anion gap 15 5 - 15  CBC with Differential  Result Value Ref Range   WBC 9.0 4.0 - 10.5 K/uL   RBC 4.28 4.22 - 5.81 MIL/uL   Hemoglobin 13.8 13.0 - 17.0 g/dL   HCT 61.4 43.1 - 54.0 %   MCV 94.4 80.0 - 100.0 fL   MCH 32.2 26.0 - 34.0 pg   MCHC 34.2 30.0 - 36.0 g/dL   RDW 08.6 76.1 - 95.0 %   Platelets 72 (L) 150 - 400 K/uL   nRBC 0.0 0.0 - 0.2 %   Neutrophils Relative % 64 %   Neutro Abs 5.8 1.7 - 7.7 K/uL   Lymphocytes Relative 14 %   Lymphs Abs 1.3 0.7 - 4.0 K/uL   Monocytes Relative 18 %   Monocytes Absolute 1.6 (H) 0.1 - 1.0 K/uL   Eosinophils Relative 2 %   Eosinophils Absolute 0.2 0.0 - 0.5 K/uL   Basophils Relative 1 %   Basophils Absolute 0.1 0.0 - 0.1 K/uL   Immature Granulocytes 1 %   Abs Immature Granulocytes 0.11 (H) 0.00 -  0.07 K/uL  Urinalysis, Routine w  reflex microscopic  Result Value Ref Range   Color, Urine YELLOW YELLOW   APPearance CLEAR CLEAR   Specific Gravity, Urine 1.012 1.005 - 1.030   pH 6.0 5.0 - 8.0   Glucose, UA NEGATIVE NEGATIVE mg/dL   Hgb urine dipstick MODERATE (A) NEGATIVE   Bilirubin Urine NEGATIVE NEGATIVE   Ketones, ur 5 (A) NEGATIVE mg/dL   Protein, ur 161100 (A) NEGATIVE mg/dL   Nitrite NEGATIVE NEGATIVE   Leukocytes,Ua NEGATIVE NEGATIVE   RBC / HPF 0-5 0 - 5 RBC/hpf   WBC, UA 0-5 0 - 5 WBC/hpf   Bacteria, UA NONE SEEN NONE SEEN   Mucus PRESENT   Ethanol  Result Value Ref Range   Alcohol, Ethyl (B) <10 <10 mg/dL  Rapid urine drug screen (hospital performed)  Result Value Ref Range   Opiates NONE DETECTED NONE DETECTED   Cocaine NONE DETECTED NONE DETECTED   Benzodiazepines NONE DETECTED NONE DETECTED   Amphetamines NONE DETECTED NONE DETECTED   Tetrahydrocannabinol NONE DETECTED NONE DETECTED   Barbiturates NONE DETECTED NONE DETECTED    EKG None  Radiology Dg Chest Portable 1 View  Result Date: 04/25/2019 CLINICAL DATA:  Cough and fever EXAM: PORTABLE CHEST 1 VIEW COMPARISON:  01/17/2014 FINDINGS: The heart size and mediastinal contours are within normal limits. Both lungs are clear. The visualized skeletal structures are unremarkable. IMPRESSION: No active disease. Electronically Signed   By: Jasmine PangKim  Fujinaga M.D.   On: 04/25/2019 01:04    Procedures Procedures (including critical care time) CRITICAL CARE Performed by: Arnoldo HookerShari A Emilyrose Darrah   Total critical care time: 60 minutes  Critical care time was exclusive of separately billable procedures and treating other patients.  Critical care was necessary to treat or prevent imminent or life-threatening deterioration.  Critical care was time spent personally by me on the following activities: development of treatment plan with patient and/or surrogate as well as nursing, discussions with consultants, evaluation of  patient's response to treatment, examination of patient, obtaining history from patient or surrogate, ordering and performing treatments and interventions, ordering and review of laboratory studies, ordering and review of radiographic studies, pulse oximetry and re-evaluation of patient's condition.  Medications Ordered in ED Medications  LORazepam (ATIVAN) injection 0-4 mg (2 mg Intravenous Given 04/25/19 0216)    Or  LORazepam (ATIVAN) tablet 0-4 mg ( Oral See Alternative 04/25/19 0216)  LORazepam (ATIVAN) injection 0-4 mg (has no administration in time range)    Or  LORazepam (ATIVAN) tablet 0-4 mg (has no administration in time range)  thiamine (VITAMIN B-1) tablet 100 mg (has no administration in time range)    Or  thiamine (B-1) injection 100 mg (has no administration in time range)  dexmedetomidine (PRECEDEX) 200 MCG/50ML (4 mcg/mL) infusion (0.6 mcg/kg/hr  61.2 kg Intravenous Rate/Dose Verify 04/25/19 0356)  enoxaparin (LOVENOX) injection 40 mg (has no administration in time range)  sodium chloride flush (NS) 0.9 % injection 3 mL (3 mLs Intravenous Given 04/25/19 0033)  sodium chloride 0.9 % bolus 2,000 mL (0 mLs Intravenous Stopped 04/25/19 0248)  LORazepam (ATIVAN) 2 MG/ML injection (2 mg Intravenous Given 04/25/19 0216)  sodium chloride 0.9 % bolus 2,000 mL (2,000 mLs Intravenous New Bag/Given 04/25/19 0318)     Initial Impression / Assessment and Plan / ED Course  I have reviewed the triage vital signs and the nursing notes.  Pertinent labs & imaging results that were available during my care of the patient were reviewed by me and considered in my medical decision making (  see chart for details).        Patient to ED altered with limited history.  Per EMS he was picked up by [employer] and taken to [facility], who then sent him to the ED. The patient is spanish speaking and an interpreter was used to try to obtain a history however, the patient is not able to give oriented answers. He is  tremulous on arrival. Per chart review, history of alcoholism with previous admissions for DT's. Suspect he is in withdrawal now.   He is very agitated and agitation has escalated in a short period of time. Tachycardic to low 100's, hypertensive. He has a low grade fever. No coughing is observed. No vomiting. He does not appear to have tactile, auditory hallucinations. Alcohol level <10. CIWA score after 6 mg Ativan within 30 minutes is still 30, per nursing. IVF's running, 2 IV's established.   2:35 - Precedex ordered due to uncontrolled and escalating agitation. Dr. Leonides Schanz aware of patient's condition and is involved in care.   3:05 - patient is up to 0.5 mcg Precedex. Remains agitated, combative. COVID negative, critical care paged for admission  3:25 - discussed with Critical Care who advises they will see him in the ED.   4:15 - PCCM in the department to assume care. Patient is quiet, sleeping, VS improved.   Final Clinical Impressions(s) / ED Diagnoses   Final diagnoses:  None   1. DT's  ED Discharge Orders    None       Charlann Lange, PA-C 04/25/19 [place], Delice Bison, DO 04/25/19 [phone]

## 2019-04-25 NOTE — ED Notes (Signed)
Pt continues to be aggressive and attempting to pull out IV lines and get out of bed. Writer at bedside to ensure pt safety. Nehemiah Settle PA made aware of pt behavior after Ativan administration.

## 2019-04-25 NOTE — BH Assessment (Addendum)
Assessment Note  Vincent Peters is an 38 y.o. separated male who presents voluntarily and unaccompanied to [facility] after being transported by Event organiser. Pt is Spanish speaking and tele-interpreter service was used. Pt appears intoxicated due to smelling of alcohol, slurred speech and unsteady gate. Pt says he was drinking alcohol tonight with friends and cleaning tombstones and law enforcement told him he could either come for a mental health evaluation or go to jail. Pt say he has COVID and was running away from officers because he did not want to infect anyone. Pt reports he drinks 10-12 beers daily. Pt initially stated he didn't drink today, then said he drank one 40-ounce beer. He denies other substance use but Pt's medical record indicates he has a history of using cocaine. Pt acknowledges symptoms including social withdrawal, loss of interest in usual pleasures, fatigue, decreased concentration, decreased sleep and feelings of guilt. He reports experiencing auditory hallucinations of music and people talking to him. He denies current suicidal ideation. He reports one previous suicide attempt. He denies current homicidal ideation or history of violence.  Pt says he lives with his uncle and a friend. He identifies his sister as his primary support. He denies current legal problems. He denies access to firearms. He denies current outpatient mental health providers. He denies history of inpatient or outpatient mental health or substance abuse treatment.  Pt appears disheveled and unclean. He is alert and oriented x4. Pt speaks in a slurred tone, at moderate volume and normal pace. Motor behavior appears slightly tremulous and he says he feels unsteady on his feet. Eye contact is good. Pt's mood is anxious and affect is congruent with mood. Thought process is coherent but tangential at times.    Diagnosis:  F10.20 Alcohol use disorder, Severe F10.24 Alcohol-induced depressive  disorder, With moderate or severe use disorder  Past Medical History:  Past Medical History:  Diagnosis Date  . Alcohol abuse   . Cocaine abuse ([place])     No past surgical history on file.  Family History: No family history on file.  Social History:  reports that he has never smoked. He has never used smokeless tobacco. He reports current alcohol use of about 168.0 standard drinks of alcohol per week. He reports current drug use. Drug: Cocaine.  Additional Social History:  Alcohol / Drug Use Pain Medications: Denies abuse Prescriptions: Denies abuse Over the Counter: Denies abuse History of alcohol / drug use?: Yes Longest period of sobriety (when/how long): Unknown Negative Consequences of Use: Financial, Legal, Personal relationships, Work / School Withdrawal Symptoms: Nausea / Vomiting, Blackouts, Sweats, Tremors Substance #1 Name of Substance 1: Alcohol 1 - Age of First Use: 17 1 - Amount (size/oz): 10-12 cans of beer 1 - Frequency: Daily 1 - Duration: Ongoing for years 1 - Last Use / Amount: 04/24/2019, 40-ounce beer  CIWA:   COWS:    Allergies: No Known Allergies  Home Medications: (Not in a hospital admission)   OB/GYN Status:  No LMP for male patient.  General Assessment Data Location of Assessment: [facility] Assessment Services TTS Assessment: In system Is this a Tele or Face-to-Face Assessment?: Face-to-Face Is this an Initial Assessment or a Re-assessment for this encounter?: Initial Assessment Patient Accompanied by:: N/A Language Other than English: Yes What is your preferred language: Spanish Living Arrangements: Other (Comment)(Lives with uncle and friend) What gender do you identify as?: Male Marital status: Separated Maiden name: NA Pregnancy Status: No Living Arrangements: Other relatives, Non-relatives/Friends Can pt return  to current living arrangement?: Yes Admission Status: Voluntary Is patient capable of signing voluntary admission?:  Yes Referral Source: Self/Family/Friend Insurance type: Self-pay  Medical Screening Exam [facility] Walk-in ONLY) Medical Exam completed: Yes(Jason Allyson Sabal, FNP)  Crisis Care Plan Living Arrangements: Other relatives, Non-relatives/Friends Legal Guardian: Other:(Self) Name of Psychiatrist: None Name of Therapist: None  Education Status Is patient currently in school?: No Is the patient employed, unemployed or receiving disability?: Employed  Risk to self with the past 6 months Suicidal Ideation: No Has patient been a risk to self within the past 6 months prior to admission? : No Suicidal Intent: No Has patient had any suicidal intent within the past 6 months prior to admission? : No Is patient at risk for suicide?: No Suicidal Plan?: No Has patient had any suicidal plan within the past 6 months prior to admission? : No Access to Means: No What has been your use of drugs/alcohol within the last 12 months?: Pt drinking alcohol daily Previous Attempts/Gestures: Yes How many times?: 1 Other Self Harm Risks: None Triggers for Past Attempts: Other (Comment)(Separation from wife) Intentional Self Injurious Behavior: None Family Suicide History: Unknown Recent stressful life event(s): Financial Problems Persecutory voices/beliefs?: No Depression: Yes Depression Symptoms: Despondent, Isolating, Fatigue, Guilt, Loss of interest in usual pleasures, Feeling worthless/self pity Substance abuse history and/or treatment for substance abuse?: Yes Suicide prevention information given to non-admitted patients: Not applicable  Risk to Others within the past 6 months Homicidal Ideation: No Does patient have any lifetime risk of violence toward others beyond the six months prior to admission? : No Thoughts of Harm to Others: No Current Homicidal Intent: No Current Homicidal Plan: No Access to Homicidal Means: No Identified Victim: None History of harm to others?: No Assessment of Violence: None  Noted Violent Behavior Description: Pt denies history of violence Does patient have access to weapons?: No Criminal Charges Pending?: No Does patient have a court date: No Is patient on probation?: No  Psychosis Hallucinations: Auditory Delusions: None noted  Mental Status Report Appearance/Hygiene: Disheveled Eye Contact: Good Motor Activity: Unremarkable Speech: Slurred Level of Consciousness: Other (Comment)(Intoxicated) Mood: Depressed, Anxious Affect: Anxious Anxiety Level: Minimal Thought Processes: Coherent Judgement: Impaired Orientation: Person, Place, Time, Situation Obsessive Compulsive Thoughts/Behaviors: None  Cognitive Functioning Concentration: Decreased Memory: Unable to Assess Is patient IDD: No Insight: Poor Impulse Control: Fair Appetite: Good Have you had any weight changes? : No Change Sleep: Decreased Total Hours of Sleep: 3 Vegetative Symptoms: Decreased grooming  ADLScreening [facility] Assessment Services) Patient's cognitive ability adequate to safely complete daily activities?: Yes Patient able to express need for assistance with ADLs?: Yes Independently performs ADLs?: Yes (appropriate for developmental age)  Prior Inpatient Therapy Prior Inpatient Therapy: No  Prior Outpatient Therapy Prior Outpatient Therapy: No Does patient have an ACCT team?: No Does patient have Intensive In-House Services?  : No Does patient have [facility] services? : No Does patient have P4CC services?: No  ADL Screening (condition at time of admission) Patient's cognitive ability adequate to safely complete daily activities?: Yes Is the patient deaf or have difficulty hearing?: No Does the patient have difficulty seeing, even when wearing glasses/contacts?: No Does the patient have difficulty concentrating, remembering, or making decisions?: No Patient able to express need for assistance with ADLs?: Yes Does the patient have difficulty dressing or bathing?:  No Independently performs ADLs?: Yes (appropriate for developmental age) Does the patient have difficulty walking or climbing stairs?: No Weakness of Legs: None Weakness of Arms/Hands: None  Home  Assistive Devices/Equipment Home Assistive Devices/Equipment: None    Abuse/Neglect Assessment (Assessment to be complete while patient is alone) Abuse/Neglect Assessment Can Be Completed: Yes Physical Abuse: Denies Verbal Abuse: Denies Sexual Abuse: Denies Exploitation of patient/patient's resources: Denies Self-Neglect: Denies     Merchant navy officerAdvance Directives (For Healthcare) Does Patient Have a Medical Advance Directive?: No Would patient like information on creating a medical advance directive?: No - Patient declined          Disposition: Gave clinical report to Nira ConnJason Berry, FNP who completed MSE and recommended Pt be transferred to [facility] ED for medical clearance and further evaluation. [facility].  Disposition Initial Assessment Completed for this Encounter: Yes Disposition of Patient: [facility] Patient refused recommended treatment: No Mode of transportation if patient is discharged/movement?: [place]  On Site Evaluation by:  Nira ConnJason Berry, FNP Reviewed with Physician:    Pamalee LeydenFord Ellis Kwame Ryland Jr, [facility], [facility], [facility] Triage Specialist [phone]  Patsy BaltimoreWarrick Jr, Harlin RainFord Ellis 04/25/2019 12:03 AM

## 2019-04-26 DIAGNOSIS — F10231 Alcohol dependence with withdrawal delirium: Principal | ICD-10-CM

## 2019-04-26 DIAGNOSIS — E876 Hypokalemia: Secondary | ICD-10-CM

## 2019-04-26 LAB — COMPREHENSIVE METABOLIC PANEL
ALT: 177 U/L — ABNORMAL HIGH (ref 0–44)
AST: 225 U/L — ABNORMAL HIGH (ref 15–41)
Albumin: 3.2 g/dL — ABNORMAL LOW (ref 3.5–5.0)
Alkaline Phosphatase: 81 U/L (ref 38–126)
Anion gap: 9 (ref 5–15)
BUN: 6 mg/dL (ref 6–20)
CO2: 27 mmol/L (ref 22–32)
Calcium: 9.1 mg/dL (ref 8.9–10.3)
Chloride: 97 mmol/L — ABNORMAL LOW (ref 98–111)
Creatinine, Ser: 0.45 mg/dL — ABNORMAL LOW (ref 0.61–1.24)
GFR calc Af Amer: >60 mL/min (ref >60)
GFR calc non Af Amer: >60 mL/min (ref >60)
Glucose, Bld: 133 mg/dL — ABNORMAL HIGH (ref 70–99)
Potassium: 3.3 mmol/L — ABNORMAL LOW (ref 3.5–5.1)
Sodium: 133 mmol/L — ABNORMAL LOW (ref 135–145)
Total Bilirubin: 0.9 mg/dL (ref 0.3–1.2)
Total Protein: 7 g/dL (ref 6.5–8.1)

## 2019-04-26 LAB — CBC WITH DIFFERENTIAL/PLATELET
Abs Immature Granulocytes: 0.03 10*3/uL (ref 0.00–0.07)
Basophils Absolute: 0.1 10*3/uL (ref 0.0–0.1)
Basophils Relative: 1 %
Eosinophils Absolute: 0.3 10*3/uL (ref 0.0–0.5)
Eosinophils Relative: 5 %
HCT: 42.1 % (ref 39.0–52.0)
Hemoglobin: 14.1 g/dL (ref 13.0–17.0)
Immature Granulocytes: 1 %
Lymphocytes Relative: 14 %
Lymphs Abs: 0.9 10*3/uL (ref 0.7–4.0)
MCH: 32.3 pg (ref 26.0–34.0)
MCHC: 33.5 g/dL (ref 30.0–36.0)
MCV: 96.6 fL (ref 80.0–100.0)
Monocytes Absolute: 1.1 10*3/uL — ABNORMAL HIGH (ref 0.1–1.0)
Monocytes Relative: 18 %
Neutro Abs: 3.7 10*3/uL (ref 1.7–7.7)
Neutrophils Relative %: 61 %
Platelets: 91 10*3/uL — ABNORMAL LOW (ref 150–400)
RBC: 4.36 MIL/uL (ref 4.22–5.81)
RDW: 12.2 % (ref 11.5–15.5)
WBC: 6 10*3/uL (ref 4.0–10.5)
nRBC: 0 % (ref 0.0–0.2)

## 2019-04-26 LAB — HIV ANTIBODY (ROUTINE TESTING W REFLEX): HIV Screen 4th Generation wRfx: NONREACTIVE

## 2019-04-26 MED ORDER — POTASSIUM CHLORIDE CRYS ER 20 MEQ PO TBCR
40.0000 meq | EXTENDED_RELEASE_TABLET | Freq: Two times a day (BID) | ORAL | Status: AC
  Administered 2019-04-26 (×2): 40 meq via ORAL
  Filled 2019-04-26 (×2): qty 2

## 2019-04-26 NOTE — Progress Notes (Signed)
NAMEYaniel Peters, MRN:  [id number], DOB:  1981-02-25, LOS: 1 ADMISSION DATE:  04/25/2019, CONSULTATION DATE: 04/25/2019 REFERRING MD: ED, CHIEF COMPLAINT: Alcohol withdrawal  Brief History   38yo M with history significant for alcohol abuse admitted for AMS, [facility] but transferred here due to concern for COVID due to cough and fever prior to admission, T-max on admit 100.4. He became very agitated and tremulous on arrival with concern for alcohol withdrawal, PCCM consulted for Precedex drip.   Past Medical History  Cocaine and alcohol abuse  Significant Hospital Events   9/21 >> Admitted, Precedex drip   Consults:  PCCM  Procedures:  NA  Significant Diagnostic Tests:  NA  Micro Data:  COVID >> neg MRSA PCR >> neg  Antimicrobials:  NA  Interim history/subjective:  RN reports no acute events overnight, interpreter utilized to complete assessment. He is more alert and oriented this morning. Currently on Precedex drip with weaning. He is requesting something to eat and drink this morning.  Objective   Blood pressure (!) 127/94, pulse 73, temperature 98.4 F (36.9 C), temperature source Oral, resp. rate 15, height 5\' 5"  (1.651 m), weight 61.3 kg, SpO2 99 %.        Intake/Output Summary (Last 24 hours) at 04/26/2019 0816 Last data filed at 04/26/2019 0600 Gross per 24 hour  Intake 1082.27 ml  Output 4525 ml  Net -3442.73 ml   Filed Weights   04/25/19 0019 04/25/19 0606 04/26/19 0428  Weight: 61.2 kg 63.7 kg 61.3 kg    Examination: General: Vincent Peters in NAD, more alert and oriented  HEENT: MM pink/moist, halitosis, PERRL,  Neuro: Alert and oriented x2 with slight confusion, no focal neuro deficits, non-tremerolus   CV: s1s2 regular rate and rhythm, no murmur, rubs, or gallops,  PULM:  Clear to ascultation bilaterally, no added breath sounds, no accessory muscle use  GI: soft, bowel sounds  active in all 4 quadrants, non-tender, non-distended Extremities: warm/dry, no edema  Skin: no rashes or lesions  Resolved Hospital Problem list   NA  Assessment & Plan:   Delirium tremens  -in the setting of alcohol withdrawal. Reported 6-10 beers a day for "many years" states he has experienced alcohol withdrawal in the past but has never suffered a seizure due to withdrawal. He denies any illicit substance abuse.  P: Wean Precedex drip off  Scheduled Librium  Frequent CIWA scale  Social worker consult for resources upon discharge   Elevated LFT's -secondary to chronic alcohol abuse P: Trend CMP Consider ABD [place] if levels remain elevated   Best practice:  Diet: Regular  Pain/Anxiety/Delirium protocol (if indicated): Precedex drip / Librium  VAP protocol (if indicated): N/A DVT prophylaxis: Lovenox GI prophylaxis: N/A Glucose control: NA Mobility: Up with assistance  Code Status: Full Family Communication: Patient states he lives with an uncle but does not have his number (cell phone broken), unable to update family  Disposition: Transfer to stepdown   Labs   CBC: Recent Labs  Lab 04/25/19 0028  WBC 9.0  NEUTROABS 5.8  HGB 13.8  HCT 40.4  MCV 94.4  PLT 72*    Basic Metabolic Panel: Recent Labs  Lab 04/25/19 0028  NA 135  K 3.2*  CL 96*  CO2 24  GLUCOSE 107*  BUN 13  CREATININE 0.56*  CALCIUM 9.4   GFR: Estimated Creatinine Clearance: 110.7 mL/min (A) (by C-G formula based on SCr of 0.56 mg/dL (L)). Recent  Labs  Lab 04/25/19 0028  WBC 9.0  LATICACIDVEN 1.2    Liver Function Tests: Recent Labs  Lab 04/25/19 0028  AST 314*  ALT 174*  ALKPHOS 93  BILITOT 1.1  PROT 8.0  ALBUMIN 3.6   No results for input(s): LIPASE, AMYLASE in the last 168 hours. No results for input(s): AMMONIA in the last 168 hours.  ABG No results found for: PHART, PCO2ART, PO2ART, HCO3, TCO2, ACIDBASEDEF, O2SAT   Coagulation Profile: No results for input(s): INR,  PROTIME in the last 168 hours.  Cardiac Enzymes: No results for input(s): CKTOTAL, CKMB, CKMBINDEX, TROPONINI in the last 168 hours.  HbA1C: No results found for: HGBA1C  CBG: No results for input(s): GLUCAP in the last 168 hours.        Delfin Gant, NP-C [facility] Pgr: [phone] or if no answer [phone] 04/26/2019, 8:44 AM

## 2019-04-27 DIAGNOSIS — D696 Thrombocytopenia, unspecified: Secondary | ICD-10-CM

## 2019-04-27 DIAGNOSIS — R739 Hyperglycemia, unspecified: Secondary | ICD-10-CM

## 2019-04-27 DIAGNOSIS — R945 Abnormal results of liver function studies: Secondary | ICD-10-CM

## 2019-04-27 LAB — CBC WITH DIFFERENTIAL/PLATELET
Abs Immature Granulocytes: 0.06 10*3/uL (ref 0.00–0.07)
Basophils Absolute: 0.1 10*3/uL (ref 0.0–0.1)
Basophils Relative: 1 %
Eosinophils Absolute: 0.2 10*3/uL (ref 0.0–0.5)
Eosinophils Relative: 2 %
HCT: 41.6 % (ref 39.0–52.0)
Hemoglobin: 13.6 g/dL (ref 13.0–17.0)
Immature Granulocytes: 1 %
Lymphocytes Relative: 17 %
Lymphs Abs: 1.5 10*3/uL (ref 0.7–4.0)
MCH: 32.1 pg (ref 26.0–34.0)
MCHC: 32.7 g/dL (ref 30.0–36.0)
MCV: 98.1 fL (ref 80.0–100.0)
Monocytes Absolute: 2 10*3/uL — ABNORMAL HIGH (ref 0.1–1.0)
Monocytes Relative: 23 %
Neutro Abs: 5.1 10*3/uL (ref 1.7–7.7)
Neutrophils Relative %: 56 %
Platelets: 111 10*3/uL — ABNORMAL LOW (ref 150–400)
RBC: 4.24 MIL/uL (ref 4.22–5.81)
RDW: 12.6 % (ref 11.5–15.5)
WBC: 9 10*3/uL (ref 4.0–10.5)
nRBC: 0 % (ref 0.0–0.2)

## 2019-04-27 LAB — COMPREHENSIVE METABOLIC PANEL
ALT: 163 U/L — ABNORMAL HIGH (ref 0–44)
ALT: 179 U/L — ABNORMAL HIGH (ref 0–44)
AST: 161 U/L — ABNORMAL HIGH (ref 15–41)
AST: 168 U/L — ABNORMAL HIGH (ref 15–41)
Albumin: 3.1 g/dL — ABNORMAL LOW (ref 3.5–5.0)
Albumin: 3.6 g/dL (ref 3.5–5.0)
Alkaline Phosphatase: 85 U/L (ref 38–126)
Alkaline Phosphatase: 94 U/L (ref 38–126)
Anion gap: 10 (ref 5–15)
Anion gap: 9 (ref 5–15)
BUN: 9 mg/dL (ref 6–20)
BUN: 10 mg/dL (ref 6–20)
CO2: 25 mmol/L (ref 22–32)
CO2: 26 mmol/L (ref 22–32)
Calcium: 9 mg/dL (ref 8.9–10.3)
Calcium: 9.8 mg/dL (ref 8.9–10.3)
Chloride: 100 mmol/L (ref 98–111)
Chloride: 99 mmol/L (ref 98–111)
Creatinine, Ser: 0.43 mg/dL — ABNORMAL LOW (ref 0.61–1.24)
Creatinine, Ser: 0.63 mg/dL (ref 0.61–1.24)
GFR calc Af Amer: >60 mL/min (ref >60)
GFR calc Af Amer: >60 mL/min (ref >60)
GFR calc non Af Amer: >60 mL/min (ref >60)
GFR calc non Af Amer: >60 mL/min (ref >60)
Glucose, Bld: 106 mg/dL — ABNORMAL HIGH (ref 70–99)
Glucose, Bld: 112 mg/dL — ABNORMAL HIGH (ref 70–99)
Potassium: 3.7 mmol/L (ref 3.5–5.1)
Potassium: 4.3 mmol/L (ref 3.5–5.1)
Sodium: 135 mmol/L (ref 135–145)
Sodium: 134 mmol/L — ABNORMAL LOW (ref 135–145)
Total Bilirubin: 0.6 mg/dL (ref 0.3–1.2)
Total Bilirubin: 0.8 mg/dL (ref 0.3–1.2)
Total Protein: 7.2 g/dL (ref 6.5–8.1)
Total Protein: 7.7 g/dL (ref 6.5–8.1)

## 2019-04-27 LAB — PHOSPHORUS: Phosphorus: 3.5 mg/dL (ref 2.5–4.6)

## 2019-04-27 LAB — CBC
HCT: 44 % (ref 39.0–52.0)
Hemoglobin: 14.7 g/dL (ref 13.0–17.0)
MCH: 32.3 pg (ref 26.0–34.0)
MCHC: 33.4 g/dL (ref 30.0–36.0)
MCV: 96.7 fL (ref 80.0–100.0)
Platelets: 159 10*3/uL (ref 150–400)
RBC: 4.55 MIL/uL (ref 4.22–5.81)
RDW: 12.7 % (ref 11.5–15.5)
WBC: 9.3 10*3/uL (ref 4.0–10.5)
nRBC: 0 % (ref 0.0–0.2)

## 2019-04-27 LAB — MAGNESIUM: Magnesium: 1.6 mg/dL — ABNORMAL LOW (ref 1.7–2.4)

## 2019-04-27 MED ORDER — VITAMIN B-1 100 MG PO TABS
100.0000 mg | ORAL_TABLET | Freq: Every day | ORAL | Status: DC
  Administered 2019-04-28 – 2019-05-01 (×4): 100 mg via ORAL
  Filled 2019-04-27 (×4): qty 1

## 2019-04-27 MED ORDER — DEXMEDETOMIDINE HCL IN NACL 200 MCG/50ML IV SOLN
0.2000 ug/kg/h | INTRAVENOUS | Status: DC
  Administered 2019-04-27: 0.2 ug/kg/h via INTRAVENOUS
  Administered 2019-04-28: 0.3 ug/kg/h via INTRAVENOUS
  Filled 2019-04-27 (×3): qty 50

## 2019-04-27 MED ORDER — LORAZEPAM 1 MG PO TABS
2.0000 mg | ORAL_TABLET | Freq: Once | ORAL | Status: AC
  Administered 2019-04-27: 13:00:00 2 mg via ORAL
  Filled 2019-04-27: qty 2

## 2019-04-27 MED ORDER — LORAZEPAM 1 MG PO TABS: 1.0000 mg | ORAL_TABLET | ORAL | Status: AC | PRN

## 2019-04-27 MED ORDER — CHLORDIAZEPOXIDE HCL 25 MG PO CAPS: 25.0000 mg | ORAL_CAPSULE | ORAL | Status: DC

## 2019-04-27 MED ORDER — LORAZEPAM 1 MG PO TABS
0.0000 mg | ORAL_TABLET | ORAL | Status: AC
  Administered 2019-04-27: 2 mg via ORAL
  Administered 2019-04-27: 3 mg via ORAL
  Administered 2019-04-28: 1 mg via ORAL
  Administered 2019-04-28 (×2): 2 mg via ORAL
  Filled 2019-04-27 (×2): qty 2
  Filled 2019-04-27: qty 3
  Filled 2019-04-27: qty 2

## 2019-04-27 MED ORDER — VITAMIN B-1 100 MG PO TABS: 100.0000 mg | ORAL_TABLET | Freq: Every day | ORAL | Status: DC

## 2019-04-27 MED ORDER — SODIUM CHLORIDE 0.9 % IV SOLN: INTRAVENOUS | Status: AC

## 2019-04-27 MED ORDER — LOPERAMIDE HCL 2 MG PO CAPS: 2.0000 mg | ORAL_CAPSULE | ORAL | Status: AC | PRN

## 2019-04-27 MED ORDER — FOLIC ACID 1 MG PO TABS: 1.0000 mg | ORAL_TABLET | Freq: Every day | ORAL | Status: DC

## 2019-04-27 MED ORDER — CHLORDIAZEPOXIDE HCL 25 MG PO CAPS: 25.0000 mg | ORAL_CAPSULE | Freq: Every day | ORAL | Status: DC

## 2019-04-27 MED ORDER — CHLORDIAZEPOXIDE HCL 25 MG PO CAPS: 25.0000 mg | ORAL_CAPSULE | Freq: Three times a day (TID) | ORAL | Status: DC

## 2019-04-27 MED ORDER — ONDANSETRON 4 MG PO TBDP: 4.0000 mg | ORAL_TABLET | Freq: Four times a day (QID) | ORAL | Status: AC | PRN

## 2019-04-27 MED ORDER — FOLIC ACID 1 MG PO TABS
1.0000 mg | ORAL_TABLET | Freq: Every day | ORAL | Status: DC
  Administered 2019-04-27 – 2019-05-01 (×5): 1 mg via ORAL
  Filled 2019-04-27 (×5): qty 1

## 2019-04-27 MED ORDER — HALOPERIDOL LACTATE 5 MG/ML IJ SOLN
5.0000 mg | Freq: Once | INTRAMUSCULAR | Status: AC
  Administered 2019-04-27: 5 mg via INTRAVENOUS

## 2019-04-27 MED ORDER — ADULT MULTIVITAMIN W/MINERALS CH
1.0000 | ORAL_TABLET | Freq: Every day | ORAL | Status: DC
  Administered 2019-04-27 – 2019-05-01 (×5): 1 via ORAL
  Filled 2019-04-27 (×5): qty 1

## 2019-04-27 MED ORDER — LORAZEPAM 2 MG/ML IJ SOLN: 0.0000 mg | Freq: Two times a day (BID) | INTRAMUSCULAR | Status: DC

## 2019-04-27 MED ORDER — ADULT MULTIVITAMIN W/MINERALS CH: 1.0000 | ORAL_TABLET | Freq: Every day | ORAL | Status: DC

## 2019-04-27 MED ORDER — HALOPERIDOL LACTATE 5 MG/ML IJ SOLN
INTRAMUSCULAR | Status: AC
  Filled 2019-04-27: qty 1

## 2019-04-27 MED ORDER — LORAZEPAM 1 MG PO TABS
0.0000 mg | ORAL_TABLET | Freq: Two times a day (BID) | ORAL | Status: DC
  Administered 2019-04-27: 3 mg via ORAL
  Filled 2019-04-27: qty 3

## 2019-04-27 MED ORDER — LORAZEPAM 2 MG/ML IJ SOLN
1.0000 mg | INTRAMUSCULAR | Status: AC | PRN
  Administered 2019-04-27 – 2019-04-29 (×5): 2 mg via INTRAVENOUS
  Filled 2019-04-27 (×6): qty 1

## 2019-04-27 MED ORDER — LORAZEPAM 1 MG PO TABS
0.0000 mg | ORAL_TABLET | Freq: Three times a day (TID) | ORAL | Status: AC
  Administered 2019-04-29 – 2019-04-30 (×3): 2 mg via ORAL
  Filled 2019-04-27 (×3): qty 2

## 2019-04-27 MED ORDER — CHLORDIAZEPOXIDE HCL 25 MG PO CAPS: 25.0000 mg | ORAL_CAPSULE | Freq: Four times a day (QID) | ORAL | Status: DC | PRN

## 2019-04-27 MED ORDER — HYDROXYZINE HCL 25 MG PO TABS
25.0000 mg | ORAL_TABLET | Freq: Four times a day (QID) | ORAL | Status: AC | PRN
  Administered 2019-04-27: 19:00:00 25 mg via ORAL
  Filled 2019-04-27: qty 1

## 2019-04-27 MED ORDER — THIAMINE HCL 100 MG/ML IJ SOLN: 100.0000 mg | Freq: Once | INTRAMUSCULAR | Status: DC

## 2019-04-27 MED ORDER — CHLORDIAZEPOXIDE HCL 25 MG PO CAPS: 25.0000 mg | ORAL_CAPSULE | Freq: Four times a day (QID) | ORAL | Status: DC

## 2019-04-27 MED ORDER — THIAMINE HCL 100 MG/ML IJ SOLN: 100.0000 mg | Freq: Every day | INTRAMUSCULAR | Status: DC

## 2019-04-27 NOTE — Progress Notes (Signed)
Xcover etoh withdrawal, EICU consulted for precedex

## 2019-04-27 NOTE — Progress Notes (Signed)
eLink Physician-Brief Progress Note Patient Name: Vincent Peters DOB: Nov 23, 1980 MRN: [id number]   Date of Service  04/27/2019  HPI/Events of Note  Patient with ETOH withdrawal.  Had been on precedex but this was d/ced and PCCM signed off.  Now with worsening agitation requiring multiple staff in room along with security.  Patient is hypertensive, maintaining airway.  eICU Interventions  Plan: Re-start precedex PCCM to continue to follow.  Will be seen in AM by ground team unless clinical picture changes.     Intervention Category Major Interventions: Delirium, psychosis, severe agitation - evaluation and management  Leatrice Parilla 04/27/2019, 8:40 PM

## 2019-04-27 NOTE — Progress Notes (Signed)
PROGRESS NOTE    Vincent Peters  VOJ:[id number] DOB: 01-May-1981 DOA: 04/25/2019 PCP: Patient, No Pcp Per  Brief Narrative:  HPI per Dr. Laurelyn Sickle on 04/25/2019  Patient is a 38 year old male with a history of alcohol and cocaine abuse.  Was brought to the emergency room after being seen at behavioral health for admission evaluation and telling them that he thought he was COVID positive.  He is COVID negative fever.  He was sent here for evaluation of fever and cough.  Initial temperature was 100.5 Fahrenheit although she has defervesced since admission to the emergency room.  Chest x-ray was clear.  White count was 9.  He has become progressively combative while in the emergency room presumably secondary to ETOH withdrawal with a less than 10 alcohol level and urine drug screen negative for cocaine.  He became progressively combative in the emergency room following IV and was started on Precedex drip.  On my evaluation patient is calm sedate.  **Interim History Patient is off of the Precedex drip but remains severely agitated and nursing states that he is more confused today than he was yesterday.  He was placed on Librium by PCCM but since they have signed off I discontinue this and place him back on a stepdown withdrawal protocol with IV lorazepam and p.o. Lorazepam.  Patient may need a sitter and will continue to monitor CIWA scores carefully.  Patient is very restless and got up out of the bed while I was speaking with him and ripped off his telemetry leads.  Assessment & Plan:   Active Problems:   Alcohol withdrawal (HCC)  Alcohol Abuse with Withdrawal and Delirium Tremens -Initially required Precedex drip not able to tolerate p.o. medications and started on Librium and weaning his Precedex drip off -He was placed on a Chlordiazepoxide 5 mg TID but changed to a Taper as well as continue Atarax and loperamide to 4 mg p.o. as needed for diarrhea and loose stools but because  patient remains significantly confused and still withdrawing continue back to the stepdown alcohol withdrawal protocol with CIWA scores every 4 hours -Continue CIWA protocol and and with p.o./IV lorazepam -Continue with Folic acid, multivitamin, thiamine  HypoNatremia; Hypochloremia -Improved. Na+ went from 133 -> 135 and Chloride went from 97 -> 100 -Continue to Monitor and Trend -Repeat CMP in AM   Hypokalemia -Improved. K+ was 3.3 -> 3.7 -Continue to Monitor and Replete as Necessary -Repeat CMP in AM   Hyperglycemia -Patient's Glucose was 107 -> 133 -> 106 -Check HbA1c in AM  -Continue to Monitor Blood Sugars Carefully and if Necessary will need to place on Sensitive Novolog AC  Abnormal LFTs -AST on Admission as 314 and trending down to 161 -ALT went from 174 -> 163 -In the setting of Alcoholism and Alcohol Abuse -Check RUQ U/S and Acute Hepatitis Panel -Check CMP in AM   Thrombocytopenia -In the setting of Alcohol Abuse -Patient's Platelet Count is improving and went from 72 -> 91 -> 111 -Continue to Monitor for S/Sx of Bleeding; Currently no overt bleeding noted -Repeat CBC in AM  DVT prophylaxis: Enoxaparin 40 mg subcu daily Code Status: FULL CODE Family Communication:  Disposition Plan: Remain in ICU and transfer to medical floor tomorrow if stable.  Consultants:   PCCM transfer   Procedures:  None   Antimicrobials:  Anti-infectives (From admission, onward)   None     Subjective: Seen and examined with the assistance of the translator Beverlee Nims [phone].  Patient states that  he feels good however he is very agitated and restless and nursing states that he has been up and out of bed and more confused today than he was yesterday.  Patient still has some tremors and nursing states that he is very hungry and eating everything including the butter packets that he is received.  No other concerns or complaints at this time and nursing is concerned that he may need a  sitter.  Objective: Vitals:   04/27/19 0500 04/27/19 0800 04/27/19 0825 04/27/19 1121  BP: (!) 167/114  (!) 145/118 (!) 172/117  Pulse: 91  96   Resp:   19 16  Temp:  98.7 F (37.1 C)    TempSrc:      SpO2:   96%   Weight: 61.3 kg     Height:        Intake/Output Summary (Last 24 hours) at 04/27/2019 1225 Last data filed at 04/27/2019 0600 Gross per 24 hour  Intake 1446.82 ml  Output 2975 ml  Net -1528.18 ml   Filed Weights   04/25/19 0606 04/26/19 0428 04/27/19 0500  Weight: 63.7 kg 61.3 kg 61.3 kg   Examination: Physical Exam:  Constitutional: WN/WD slightly agitated Male who is still withdrawing Eyes: Lids and conjunctivae normal, sclerae anicteric  ENMT: External Ears, Nose appear normal. Grossly normal hearing.  Neck: Appears normal, supple, no cervical masses, normal ROM, no appreciable thyromegaly; no JVD Respiratory: Clear to auscultation bilaterally, no wheezing, rales, rhonchi or crackles. Normal respiratory effort and patient is not tachypenic. No accessory muscle use.  Cardiovascular: Tachycardic Rate but regular rhtyhm, no murmurs / rubs / gallops. S1 and S2 auscultated. No extremity edema. Abdomen: Soft, non-tender, Distended 2/2 body habitus. No masses palpated. No appreciable hepatosplenomegaly. Bowel sounds positive x4.  GU: Deferred. Musculoskeletal: No clubbing / cyanosis of digits/nails. No joint deformity upper and lower extremities.  Skin: No rashes, lesions, ulcers on a limited skin evaluation with a Tattoo on Left Arm. No induration; Warm and dry.  Neurologic: CN 2-12 grossly intact with no focal deficits. Has some tremors still that are prominent Psychiatric: Slightly impaired judgment and insight. Alert and agitated. Anxious mood and appropriate affect.   Data Reviewed: I have personally reviewed following labs and imaging studies  CBC: Recent Labs  Lab 04/25/19 0028 04/26/19 0915 04/27/19 0200  WBC 9.0 6.0 9.0  NEUTROABS 5.8 3.7 5.1  HGB  13.8 14.1 13.6  HCT 40.4 42.1 41.6  MCV 94.4 96.6 98.1  PLT 72* 91* 111*   Basic Metabolic Panel: Recent Labs  Lab 04/25/19 0028 04/26/19 0915 04/27/19 0200  NA 135 133* 135  K 3.2* 3.3* 3.7  CL 96* 97* 100  CO2 24 27 25   GLUCOSE 107* 133* 106*  BUN 13 6 9   CREATININE 0.56* 0.45* 0.43*  CALCIUM 9.4 9.1 9.0   GFR: Estimated Creatinine Clearance: 110.7 mL/min (A) (by C-G formula based on SCr of 0.43 mg/dL (L)). Liver Function Tests: Recent Labs  Lab 04/25/19 0028 04/26/19 0915 04/27/19 0200  AST 314* 225* 161*  ALT 174* 177* 163*  ALKPHOS 93 81 85  BILITOT 1.1 0.9 0.6  PROT 8.0 7.0 7.2  ALBUMIN 3.6 3.2* 3.1*   No results for input(s): LIPASE, AMYLASE in the last 168 hours. No results for input(s): AMMONIA in the last 168 hours. Coagulation Profile: No results for input(s): INR, PROTIME in the last 168 hours. Cardiac Enzymes: No results for input(s): CKTOTAL, CKMB, CKMBINDEX, TROPONINI in the last 168 hours. BNP (last 3  results) No results for input(s): PROBNP in the last 8760 hours. HbA1C: No results for input(s): HGBA1C in the last 72 hours. CBG: No results for input(s): GLUCAP in the last 168 hours. Lipid Profile: No results for input(s): CHOL, HDL, LDLCALC, TRIG, CHOLHDL, LDLDIRECT in the last 72 hours. Thyroid Function Tests: No results for input(s): TSH, T4TOTAL, FREET4, T3FREE, THYROIDAB in the last 72 hours. Anemia Panel: No results for input(s): VITAMINB12, FOLATE, FERRITIN, TIBC, IRON, RETICCTPCT in the last 72 hours. Sepsis Labs: Recent Labs  Lab 04/25/19 0028  LATICACIDVEN 1.2    Recent Results (from the past 240 hour(s))  SARS Coronavirus 2 [facility](Hospital order, Performed in [facility] hospital lab) Nasopharyngeal Nasopharyngeal Swab     Status: None   Collection Time: 04/25/19  1:41 AM   Specimen: Nasopharyngeal Swab  Result Value Ref Range Status   SARS Coronavirus 2 NEGATIVE NEGATIVE Final    Comment: (NOTE) If result is NEGATIVE SARS-CoV-2  target nucleic acids are NOT DETECTED. The SARS-CoV-2 RNA is generally detectable in upper and lower  respiratory specimens during the acute phase of infection. The lowest  concentration of SARS-CoV-2 viral copies this assay can detect is 250  copies / mL. A negative result does not preclude SARS-CoV-2 infection  and should not be used as the sole basis for treatment or other  patient management decisions.  A negative result may occur with  improper specimen collection / handling, submission of specimen other  than nasopharyngeal swab, presence of viral mutation(s) within the  areas targeted by this assay, and inadequate number of viral copies  (<250 copies / mL). A negative result must be combined with clinical  observations, patient history, and epidemiological information. If result is POSITIVE SARS-CoV-2 target nucleic acids are DETECTED. The SARS-CoV-2 RNA is generally detectable in upper and lower  respiratory specimens dur ing the acute phase of infection.  Positive  results are indicative of active infection with SARS-CoV-2.  Clinical  correlation with patient history and other diagnostic information is  necessary to determine patient infection status.  Positive results do  not rule out bacterial infection or co-infection with other viruses. If result is PRESUMPTIVE POSTIVE SARS-CoV-2 nucleic acids MAY BE PRESENT.   A presumptive positive result was obtained on the submitted specimen  and confirmed on repeat testing.  While 2019 novel coronavirus  (SARS-CoV-2) nucleic acids may be present in the submitted sample  additional confirmatory testing may be necessary for epidemiological  and / or clinical management purposes  to differentiate between  SARS-CoV-2 and other Sarbecovirus currently known to infect humans.  If clinically indicated additional testing with an alternate test  methodology [phone]([id number]) is advised. The SARS-CoV-2 RNA is generally  detectable in upper and lower  respiratory sp ecimens during the acute  phase of infection. The expected result is Negative. Fact Sheet for Patients:  BoilerBrush.com.cyhttps://www.fda.gov/media/136312/download Fact Sheet for Healthcare Providers: https://pope.com/https://www.fda.gov/media/136313/download This test is not yet approved or cleared by the [place] FDA and has been authorized for detection and/or diagnosis of SARS-CoV-2 by FDA under an Emergency Use Authorization (EUA).  This EUA will remain in effect (meaning this test can be used) for the duration of the COVID-19 declaration under Section 564(b)(1) of the Act, 21 U.S.C. section 360bbb-3(b)(1), unless the authorization is terminated or revoked sooner. Performed at [facility], [address] [place], [place] [address]   MRSA PCR Screening     Status: None   Collection Time: 04/25/19  6:44 AM   Specimen:  Nasal Mucosa; Nasopharyngeal  Result Value Ref Range Status   MRSA by PCR NEGATIVE NEGATIVE Final    Comment:        The GeneXpert MRSA Assay (FDA approved for NASAL specimens only), is one component of a comprehensive MRSA colonization surveillance program. It is not intended to diagnose MRSA infection nor to guide or monitor treatment for MRSA infections. Performed at [facility], [address] [place], [place] [address]     Radiology Studies: No results found.   Scheduled Meds:  chlordiazePOXIDE  5 mg Oral TID   Chlorhexidine Gluconate Cloth  6 each Topical Daily   enoxaparin (LOVENOX) injection  40 mg Subcutaneous Daily   LORazepam  0-4 mg Oral Q12H   Or   LORazepam  0-4 mg Intravenous Q12H   thiamine  100 mg Oral Daily   Or   thiamine  100 mg Intravenous Daily   Continuous Infusions:   LOS: 2 days   Merlene Laughter, DO [facility] PAGER is on AMION  If 7PM-7AM, please contact night-coverage [email or web address] Password [facility] 04/27/2019, 12:25 PM

## 2019-04-27 NOTE — Progress Notes (Signed)
Patient extremely agitated, combative and unable to follow safety commands or redirection. [employer] administrator and three RNs present in room and holding patient down for med administration. Bilateral wrist restraints ordered and PRN 4mg  dose of ativan given along with one time dose of 5mg  Haldol; both ineffective. PCCM consulted, Precedex order added and restarted. Patient finally beginning to calm down and rest; RN will continue to monitor.

## 2019-04-28 ENCOUNTER — Inpatient Hospital Stay (HOSPITAL_COMMUNITY): Payer: Self-pay

## 2019-04-28 LAB — COMPREHENSIVE METABOLIC PANEL
ALT: 208 U/L — ABNORMAL HIGH (ref 0–44)
AST: 223 U/L — ABNORMAL HIGH (ref 15–41)
Albumin: 3.4 g/dL — ABNORMAL LOW (ref 3.5–5.0)
Alkaline Phosphatase: 86 U/L (ref 38–126)
Anion gap: 9 (ref 5–15)
BUN: 9 mg/dL (ref 6–20)
CO2: 26 mmol/L (ref 22–32)
Calcium: 9.6 mg/dL (ref 8.9–10.3)
Chloride: 103 mmol/L (ref 98–111)
Creatinine, Ser: 0.48 mg/dL — ABNORMAL LOW (ref 0.61–1.24)
GFR calc Af Amer: >60 mL/min (ref >60)
GFR calc non Af Amer: >60 mL/min (ref >60)
Glucose, Bld: 126 mg/dL — ABNORMAL HIGH (ref 70–99)
Potassium: 3.9 mmol/L (ref 3.5–5.1)
Sodium: 138 mmol/L (ref 135–145)
Total Bilirubin: 0.9 mg/dL (ref 0.3–1.2)
Total Protein: 7.5 g/dL (ref 6.5–8.1)

## 2019-04-28 LAB — CBC WITH DIFFERENTIAL/PLATELET
Abs Immature Granulocytes: 0.05 10*3/uL (ref 0.00–0.07)
Basophils Absolute: 0 10*3/uL (ref 0.0–0.1)
Basophils Relative: 0 %
Eosinophils Absolute: 0.2 10*3/uL (ref 0.0–0.5)
Eosinophils Relative: 2 %
HCT: 45.3 % (ref 39.0–52.0)
Hemoglobin: 14.6 g/dL (ref 13.0–17.0)
Immature Granulocytes: 1 %
Lymphocytes Relative: 21 %
Lymphs Abs: 1.6 10*3/uL (ref 0.7–4.0)
MCH: 31.9 pg (ref 26.0–34.0)
MCHC: 32.2 g/dL (ref 30.0–36.0)
MCV: 99.1 fL (ref 80.0–100.0)
Monocytes Absolute: 1.9 10*3/uL — ABNORMAL HIGH (ref 0.1–1.0)
Monocytes Relative: 24 %
Neutro Abs: 4.1 10*3/uL (ref 1.7–7.7)
Neutrophils Relative %: 52 %
Platelets: 149 10*3/uL — ABNORMAL LOW (ref 150–400)
RBC: 4.57 MIL/uL (ref 4.22–5.81)
RDW: 12.8 % (ref 11.5–15.5)
WBC: 7.8 10*3/uL (ref 4.0–10.5)
nRBC: 0 % (ref 0.0–0.2)

## 2019-04-28 LAB — HEPATITIS PANEL, ACUTE
HCV Ab: <0.1 s/co ratio (ref 0.0–0.9)
Hep A IgM: NEGATIVE
Hep B C IgM: NEGATIVE
Hepatitis B Surface Ag: NEGATIVE

## 2019-04-28 LAB — PHOSPHORUS: Phosphorus: 4.7 mg/dL — ABNORMAL HIGH (ref 2.5–4.6)

## 2019-04-28 LAB — MAGNESIUM: Magnesium: 1.9 mg/dL (ref 1.7–2.4)

## 2019-04-28 MED ORDER — CHLORDIAZEPOXIDE HCL 25 MG PO CAPS: 25.0000 mg | ORAL_CAPSULE | Freq: Four times a day (QID) | ORAL | Status: AC | PRN

## 2019-04-28 MED ORDER — CHLORDIAZEPOXIDE HCL 25 MG PO CAPS
25.0000 mg | ORAL_CAPSULE | Freq: Every day | ORAL | Status: AC
  Administered 2019-05-01: 25 mg via ORAL
  Filled 2019-04-28: qty 1

## 2019-04-28 MED ORDER — HYDRALAZINE HCL 20 MG/ML IJ SOLN: 10.0000 mg | Freq: Four times a day (QID) | INTRAMUSCULAR | Status: DC | PRN

## 2019-04-28 MED ORDER — CHLORDIAZEPOXIDE HCL 25 MG PO CAPS
25.0000 mg | ORAL_CAPSULE | ORAL | Status: AC
  Administered 2019-04-30 (×2): 25 mg via ORAL
  Filled 2019-04-28 (×2): qty 1

## 2019-04-28 MED ORDER — CHLORDIAZEPOXIDE HCL 25 MG PO CAPS
25.0000 mg | ORAL_CAPSULE | Freq: Four times a day (QID) | ORAL | Status: AC
  Administered 2019-04-28 (×4): 25 mg via ORAL
  Filled 2019-04-28 (×4): qty 1

## 2019-04-28 MED ORDER — CHLORDIAZEPOXIDE HCL 25 MG PO CAPS
25.0000 mg | ORAL_CAPSULE | Freq: Three times a day (TID) | ORAL | Status: AC
  Administered 2019-04-29 (×3): 25 mg via ORAL
  Filled 2019-04-28 (×3): qty 1

## 2019-04-28 NOTE — Progress Notes (Signed)
[name], MRN:  [id number], DOB:  [birth year], LOS: 3 ADMISSION DATE:  04/25/2019, CONSULTATION DATE: 04/25/2019 REFERRING MD: ED, CHIEF COMPLAINT: Alcohol withdrawal  Brief History   38yo M with history significant for alcohol abuse admitted for AMS, [facility] but transferred here due to concern for [place]. PCCM consulted for Precedex drip. Patient seen with improved mentation and withdrawal symptoms by end of day 9/22 Predecex drip titrated off PCCM signed off 9/23. By end of day 9/23 patient became more agitated and combative requiring re -nitiation of Precedex drip PCCM re-consultated 9/24.   Past Medical History  Cocaine and alcohol abuse  Significant Hospital Events   9/21 >> Admitted, Precedex drip  9/22 >> Precedex drip off 9/23 >> TRH as primary, worsening agitation and combative Precedex drip restarted   Consults:  PCCM  Procedures:  NA  Significant Diagnostic Tests:  RUQ ABD [place] 9/24 >>  Micro Data:  COVID >> neg MRSA PCR >> neg  Antimicrobials:  NA  Interim history/subjective:  RN reports significant agitation and combativeness overnight resulting in resumption of precedex drip. Patient currently in bilaterally wrist restraints. Currently patient is sedated with arousal to painful stimuli only.   Objective   Blood pressure (!) 169/115, pulse 86, temperature (!) 97.4 F (36.3 C), temperature source Axillary, resp. rate 11, height 5\' 5"  (1.651 m), weight 59.5 kg, SpO2 100 %.        Intake/Output Summary (Last 24 hours) at 04/28/2019 0842 Last data filed at 04/27/2019 1500 Gross per 24 hour  Intake 240 ml  Output 1150 ml  Net -910 ml   Filed Weights   04/26/19 0428 04/27/19 0500 04/28/19 0446  Weight: 61.3 kg 61.3 kg 59.5 kg   Examination: General: Adult male, appears stated age, in NAD, sedated on Precedex drip  HEENT:  MM pink/moist, halitosis, PERRL Neuro: Arouses to painful stimuli  only, sedated  CV: s1s2 regular rate and rhythm, no murmur, rubs, or gallops,  PULM:  Clear to ascultation bilaterally, no added breath sounds, no increased work of breathing  GI: soft, bowel sounds active in all 4 quadrants, non-tender, non-distended Extremities: warm/dry, no edema  Skin: no rashes or lesions  Resolved Hospital Problem list   NA  Assessment & Plan:   Alcohol abuse with withdrawal  -Reported 6-10 beers a day for "many years" states he has experienced alcohol withdrawal in the past but has never suffered a seizure due to withdrawal. He denies any illicit substance abuse P: Wean Precedex drip  Scheduled Librium taper  PRN Hydroxyzine  CIWA scale P7TG Continue folic acid, multivitamin, and thiamine Cessation resources per social work   Elevated LFT's -in the setting of alcoholism  P: Trend CMP Follow RUQ [place] and acute hepatitis panel   Thrombocytopenia  -In the setting of alcohol abuse  P: Follow CBC  Monitor for signs of bleeding  Best practice:  Diet: Regular  Pain/Anxiety/Delirium protocol (if indicated): Precedex drip / Librium  DVT prophylaxis: Lovenox GI prophylaxis: N/A Glucose control: NA Mobility: Up with assistance  Code Status: Full Family Communication: Patient states he lives with an uncle but does not have his number (cell phone broken), unable to update family  Disposition: Transfer to stepdown   Labs   CBC: Recent Labs  Lab 04/25/19 0028 04/26/19 0915 04/27/19 0200 04/27/19 1402 04/28/19 0201  WBC 9.0 6.0 9.0 9.3 7.8  NEUTROABS 5.8 3.7 5.1  --  4.1  HGB 13.8 14.1 13.6 14.7  14.6  HCT 40.4 42.1 41.6 44.0 45.3  MCV 94.4 96.6 98.1 96.7 99.1  PLT 72* 91* 111* 159 149*    Basic Metabolic Panel: Recent Labs  Lab 04/25/19 0028 04/26/19 0915 04/27/19 0200 04/27/19 1402 04/28/19 0201  NA 135 133* 135 134* 138  K 3.2* 3.3* 3.7 4.3 3.9  CL 96* 97* 100 99 103  CO2 24 27 25 26 26   GLUCOSE 107* 133* 106* 112* 126*  BUN 13 6 9 10  9   CREATININE 0.56* 0.45* 0.43* 0.63 0.48*  CALCIUM 9.4 9.1 9.0 9.8 9.6  MG  --   --   --  1.6* 1.9  PHOS  --   --   --  3.5 4.7*   GFR: Estimated Creatinine Clearance: 107.4 mL/min (A) (by C-G formula based on SCr of 0.48 mg/dL (L)). Recent Labs  Lab 04/25/19 0028 04/26/19 0915 04/27/19 0200 04/27/19 1402 04/28/19 0201  WBC 9.0 6.0 9.0 9.3 7.8  LATICACIDVEN 1.2  --   --   --   --     Liver Function Tests: Recent Labs  Lab 04/25/19 0028 04/26/19 0915 04/27/19 0200 04/27/19 1402 04/28/19 0201  AST 314* 225* 161* 168* 223*  ALT 174* 177* 163* 179* 208*  ALKPHOS 93 81 85 94 86  BILITOT 1.1 0.9 0.6 0.8 0.9  PROT 8.0 7.0 7.2 7.7 7.5  ALBUMIN 3.6 3.2* 3.1* 3.6 3.4*   No results for input(s): LIPASE, AMYLASE in the last 168 hours. No results for input(s): AMMONIA in the last 168 hours.  ABG No results found for: PHART, PCO2ART, PO2ART, HCO3, TCO2, ACIDBASEDEF, O2SAT   Coagulation Profile: No results for input(s): INR, PROTIME in the last 168 hours.  Cardiac Enzymes: No results for input(s): CKTOTAL, CKMB, CKMBINDEX, TROPONINI in the last 168 hours.  HbA1C: No results found for: HGBA1C  CBG: No results for input(s): GLUCAP in the last 168 hours.        04/29/19, NP-C [facility] Pgr: [phone] or if no answer [phone] 04/28/2019, 8:42 AM

## 2019-04-28 NOTE — Progress Notes (Signed)
Chart was reviewed and Patient remained severely agitated last night and had to have multiple Security officers and RNs in the room holding him down for medication administration.  Overnight hospitalist reached out to PCCM and eICU and they have restarted a Precedex drip.  I spoke with PCCM Dr. Rodman Pickle who will take over care as he remains on a Precedex gtt and TRH will resume care of the patient once he is medically stable and off the Precedex drip.  PCCM to transfer again to [facility] when ready.

## 2019-04-29 LAB — CBC WITH DIFFERENTIAL/PLATELET
Abs Immature Granulocytes: 0.07 10*3/uL (ref 0.00–0.07)
Basophils Absolute: 0.1 10*3/uL (ref 0.0–0.1)
Basophils Relative: 1 %
Eosinophils Absolute: 0.3 10*3/uL (ref 0.0–0.5)
Eosinophils Relative: 2 %
HCT: 44.8 % (ref 39.0–52.0)
Hemoglobin: 14.8 g/dL (ref 13.0–17.0)
Immature Granulocytes: 1 %
Lymphocytes Relative: 19 %
Lymphs Abs: 2 10*3/uL (ref 0.7–4.0)
MCH: 32.5 pg (ref 26.0–34.0)
MCHC: 33 g/dL (ref 30.0–36.0)
MCV: 98.2 fL (ref 80.0–100.0)
Monocytes Absolute: 2.8 10*3/uL — ABNORMAL HIGH (ref 0.1–1.0)
Monocytes Relative: 26 %
Neutro Abs: 5.6 10*3/uL (ref 1.7–7.7)
Neutrophils Relative %: 51 %
Platelets: 187 10*3/uL (ref 150–400)
RBC: 4.56 MIL/uL (ref 4.22–5.81)
RDW: 12.9 % (ref 11.5–15.5)
WBC: 10.9 10*3/uL — ABNORMAL HIGH (ref 4.0–10.5)
nRBC: 0 % (ref 0.0–0.2)

## 2019-04-29 LAB — COMPREHENSIVE METABOLIC PANEL
ALT: 223 U/L — ABNORMAL HIGH (ref 0–44)
AST: 171 U/L — ABNORMAL HIGH (ref 15–41)
Albumin: 3.4 g/dL — ABNORMAL LOW (ref 3.5–5.0)
Alkaline Phosphatase: 83 U/L (ref 38–126)
Anion gap: 10 (ref 5–15)
BUN: 11 mg/dL (ref 6–20)
CO2: 26 mmol/L (ref 22–32)
Calcium: 9.4 mg/dL (ref 8.9–10.3)
Chloride: 101 mmol/L (ref 98–111)
Creatinine, Ser: 0.51 mg/dL — ABNORMAL LOW (ref 0.61–1.24)
GFR calc Af Amer: >60 mL/min (ref >60)
GFR calc non Af Amer: >60 mL/min (ref >60)
Glucose, Bld: 94 mg/dL (ref 70–99)
Potassium: 3.9 mmol/L (ref 3.5–5.1)
Sodium: 137 mmol/L (ref 135–145)
Total Bilirubin: 0.7 mg/dL (ref 0.3–1.2)
Total Protein: 7.3 g/dL (ref 6.5–8.1)

## 2019-04-29 LAB — HEMOGLOBIN A1C
Hgb A1c MFr Bld: 5.4 % (ref 4.8–5.6)
Mean Plasma Glucose: 108 mg/dL

## 2019-04-29 NOTE — Progress Notes (Signed)
NAMEDoral Peters, MRN:  [id number], DOB:  Mar 30, 1981, LOS: 4 ADMISSION DATE:  04/25/2019, CONSULTATION DATE: 04/25/2019 REFERRING MD: ED, CHIEF COMPLAINT: Alcohol withdrawal  Brief History   38yo M with history significant for alcohol abuse admitted for AMS, [facility] but transferred here due to concern for Vincent Peters. PCCM consulted for Precedex drip. Patient seen with improved mentation and withdrawal symptoms by end of day 9/22 Predecex drip titrated off PCCM signed off 9/23. By end of day 9/23 patient became more agitated and combative requiring re -nitiation of Precedex drip PCCM re-consultated 9/24.   Past Medical History  Cocaine and alcohol abuse  Significant Hospital Events   9/21 >> Admitted, Precedex drip  9/22 >> Precedex drip off 9/23 >> TRH as primary, worsening agitation and combative Precedex drip restarted   Consults:  PCCM  Procedures:  NA  Significant Diagnostic Tests:  RUQ ABD [place] 9/24 >> neg  Micro Data:  COVID >> neg MRSA PCR >> neg  Antimicrobials:  NA  Interim history/subjective:  RN reports no significant events overnight, Precedex drip again stopped morning of 9/24. Patient is alert and oriented x3 today with no acute complaints, asking when breakfast will arrive.   Objective   Blood pressure (!) 108/59, pulse 76, temperature 98.7 F (37.1 C), temperature source Oral, resp. rate 16, height 5\' 5"  (1.651 m), weight 61.7 kg, SpO2 100 %.        Intake/Output Summary (Last 24 hours) at 04/29/2019 0820 Last data filed at 04/29/2019 0700 Gross per 24 hour  Intake 2158.52 ml  Output 1450 ml  Net 708.52 ml   Filed Weights   04/27/19 0500 04/28/19 0446 04/29/19 0313  Weight: 61.3 kg 59.5 kg 61.7 kg   Examination: General: Adult thin appearing male in NAD, appears older than stated age  37: MM pink/moist, PERRAL,  Neuro: Alert and oriented x3, no focal neuro deficits  CV: s1s2 regular rate  and rhythm, no murmur, rubs, or gallops,  PULM:  Clear to ascultation bilaterally, no added breath sounds, no increased work of breathing  GI: soft, bowel sounds active in all 4 quadrants, non-tender, non-distended Extremities: warm/dry, no edema  Skin: no rashes or lesions  Resolved Hospital Problem list   Thrombocytopenia   Assessment & Plan:   Alcohol abuse with withdrawal  -Reported 6-10 beers a day for "many years" states he has experienced alcohol withdrawal in the past but has never suffered a seizure due to withdrawal. He denies any illicit substance abuse P: Precedex discontinued  Tolerating Librium taper well PRN Hydroxyzine  CIWA scale [id number] Continue folic acid, multivitamin, and thiamine Cessation resources per social work  Elevated LFT's -in the setting of alcoholism -RUQ [place] neg P: Trend CMP  Best practice:  Diet: Regular  Pain/Anxiety/Delirium protocol (if indicated): Librium taper  DVT prophylaxis: Lovenox GI prophylaxis: N/A Mobility: Up with assistance  Code Status: Full Family Communication: Unable to make contact with family, patient states he will update family  Disposition: Transfer to stepdown   Labs   CBC: Recent Labs  Lab 04/25/19 0028 04/26/19 0915 04/27/19 0200 04/27/19 1402 04/28/19 0201 04/29/19 0158  WBC 9.0 6.0 9.0 9.3 7.8 10.9*  NEUTROABS 5.8 3.7 5.1  --  4.1 5.6  HGB 13.8 14.1 13.6 14.7 14.6 14.8  HCT 40.4 42.1 41.6 44.0 45.3 44.8  MCV 94.4 96.6 98.1 96.7 99.1 98.2  PLT 72* 91* 111* 159 149* 793    Basic Metabolic Panel: Recent Labs  Lab 04/26/19 0915 04/27/19 0200 04/27/19 1402 04/28/19 0201 04/29/19 0158  NA 133* 135 134* 138 137  K 3.3* 3.7 4.3 3.9 3.9  CL 97* 100 99 103 101  CO2 27 25 26 26 26   GLUCOSE 133* 106* 112* 126* 94  BUN 6 9 10 9 11   CREATININE 0.45* 0.43* 0.63 0.48* 0.51*  CALCIUM 9.1 9.0 9.8 9.6 9.4  MG  --   --  1.6* 1.9  --   PHOS  --   --  3.5 4.7*  --    GFR: Estimated Creatinine Clearance:  111 mL/min (A) (by C-G formula based on SCr of 0.51 mg/dL (L)). Recent Labs  Lab 04/25/19 0028  04/27/19 0200 04/27/19 1402 04/28/19 0201 04/29/19 0158  WBC 9.0   < > 9.0 9.3 7.8 10.9*  LATICACIDVEN 1.2  --   --   --   --   --    < > = values in this interval not displayed.    Liver Function Tests: Recent Labs  Lab 04/26/19 0915 04/27/19 0200 04/27/19 1402 04/28/19 0201 04/29/19 0158  AST 225* 161* 168* 223* 171*  ALT 177* 163* 179* 208* 223*  ALKPHOS 81 85 94 86 83  BILITOT 0.9 0.6 0.8 0.9 0.7  PROT 7.0 7.2 7.7 7.5 7.3  ALBUMIN 3.2* 3.1* 3.6 3.4* 3.4*   No results for input(s): LIPASE, AMYLASE in the last 168 hours. No results for input(s): AMMONIA in the last 168 hours.  ABG No results found for: PHART, PCO2ART, PO2ART, HCO3, TCO2, ACIDBASEDEF, O2SAT   Coagulation Profile: No results for input(s): INR, PROTIME in the last 168 hours.  Cardiac Enzymes: No results for input(s): CKTOTAL, CKMB, CKMBINDEX, TROPONINI in the last 168 hours.  HbA1C: Hgb A1c MFr Bld  Date/Time Value Ref Range Status  04/28/2019 02:01 AM 5.4 4.8 - 5.6 % Final    Comment:    (NOTE)         Prediabetes: 5.7 - 6.4         Diabetes: >6.4         Glycemic control for adults with diabetes: <7.0     CBG: No results for input(s): GLUCAP in the last 168 hours.        05/01/19, NP-C [facility] Pgr: [phone] or if no answer [phone] 04/29/2019, 8:20 AM

## 2019-04-30 NOTE — Progress Notes (Signed)
PROGRESS NOTE    Vincent Peters  ZOX:[id number] DOB: [birth year] DOA: 04/25/2019 PCP: Patient, No Pcp Per    Brief Narrative:  38 year old male with a history of alcohol and cocaine abuse. Was brought to the emergency roomafter being seen at behavioral health for admission evaluation and telling them that he thought he was COVID positive. He is COVID negative fever. He was sent here forevaluation of fever and cough. Initial temperature was 100.5 Fahrenheit although she has defervesced since admission to the emergency room. Chest x-ray was clear. White count was 9. Hehas become progressively combative while in the emergency room presumably secondary to ETOHwithdrawal with a less than 10 alcohol level and urine drug screen negative for cocaine.He became progressively combative in the emergency room following IV and was started on Precedex drip.  [facility] off precedex [facility]  Assessment & Plan:   Active Problems:   Alcohol withdrawal (HCC)  Alcohol Abuse with Withdrawal and Delirium Tremens -Initially required Precedex drip not able to tolerate p.o. medications and started on Librium with precedex since weaned to off -[facility]  HypoNatremia; Hypochloremia -Improved and since normalized -Recheck CMP in AM   Hypokalemia -normalized -follow CMP in AM  Hyperglycemia -random glucose within normal limits -Hga1c of 5.4  Abnormal LFTs likely secondary to ETOH hepatitis -RUQ U/S notable for fatty liver -Acute Hepatitis Panel negative -Will repeat cmp in AM   Thrombocytopenia -In the setting of Alcohol Abuse -resolved, most recent plt of 187  DVT prophylaxis: Lovenox subQ Code Status: Full Family Communication: Pt in room, family not at bedside Disposition Plan: Uncertain at this time  Consultants:   CCM  Procedures:     Antimicrobials: Anti-infectives (From admission, onward)   None        Subjective: Without complaints  Objective: Vitals:   04/29/19 1643 04/29/19 2105 04/30/19 0352 04/30/19 1437  BP: (!) 155/98 (!) 155/89 (!) 147/100 (!) 139/92  Pulse: 83 75 78 77  Resp: 18 16 18  (!) 21  Temp: 98.5 F (36.9 C) 98.6 F (37 C)  98.5 F (36.9 C)  TempSrc: Oral Oral  Oral  SpO2: 100% 100% 100% 99%  Weight:      Height:        Intake/Output Summary (Last 24 hours) at 04/30/2019 1449 Last data filed at 04/30/2019 1235 Gross per 24 hour  Intake 1920 ml  Output 2825 ml  Net -905 ml   Filed Weights   04/27/19 0500 04/28/19 0446 04/29/19 0313  Weight: 61.3 kg 59.5 kg 61.7 kg    Examination:  General exam: Appears calm and comfortable  Respiratory system: Clear to auscultation. Respiratory effort normal. Cardiovascular system: S1 & S2 heard, RRR Gastrointestinal system: Abdomen is nondistended, soft and nontender. No organomegaly or masses felt. Normal bowel sounds heard. Central nervous system: no tremors. No focal neurological deficits. Extremities: Symmetric 5 x 5 power. Skin: No rashes, lesions Psychiatry: Judgement and insight appear normal. Mood & affect appropriate.   Data Reviewed: I have personally reviewed following labs and imaging studies  CBC: Recent Labs  Lab 04/25/19 0028 04/26/19 0915 04/27/19 0200 04/27/19 1402 04/28/19 0201 04/29/19 0158  WBC 9.0 6.0 9.0 9.3 7.8 10.9*  NEUTROABS 5.8 3.7 5.1  --  4.1 5.6  HGB 13.8 14.1 13.6 14.7 14.6 14.8  HCT 40.4 42.1 41.6 44.0 45.3 44.8  MCV 94.4 96.6 98.1 96.7 99.1 98.2  PLT 72* 91* 111* 159 149* 187   Basic Metabolic  Panel: Recent Labs  Lab 04/26/19 0915 04/27/19 0200 04/27/19 1402 04/28/19 0201 04/29/19 0158  NA 133* 135 134* 138 137  K 3.3* 3.7 4.3 3.9 3.9  CL 97* 100 99 103 101  CO2 27 25 26 26 26   GLUCOSE 133* 106* 112* 126* 94  BUN 6 9 10 9 11   CREATININE 0.45* 0.43* 0.63 0.48* 0.51*  CALCIUM 9.1 9.0 9.8 9.6 9.4  MG  --   --  1.6* 1.9  --   PHOS  --   --  3.5 4.7*  --     GFR: Estimated Creatinine Clearance: 111 mL/min (A) (by C-G formula based on SCr of 0.51 mg/dL (L)). Liver Function Tests: Recent Labs  Lab 04/26/19 0915 04/27/19 0200 04/27/19 1402 04/28/19 0201 04/29/19 0158  AST 225* 161* 168* 223* 171*  ALT 177* 163* 179* 208* 223*  ALKPHOS 81 85 94 86 83  BILITOT 0.9 0.6 0.8 0.9 0.7  PROT 7.0 7.2 7.7 7.5 7.3  ALBUMIN 3.2* 3.1* 3.6 3.4* 3.4*   No results for input(s): LIPASE, AMYLASE in the last 168 hours. No results for input(s): AMMONIA in the last 168 hours. Coagulation Profile: No results for input(s): INR, PROTIME in the last 168 hours. Cardiac Enzymes: No results for input(s): CKTOTAL, CKMB, CKMBINDEX, TROPONINI in the last 168 hours. BNP (last 3 results) No results for input(s): PROBNP in the last 8760 hours. HbA1C: Recent Labs    04/28/19 0201  HGBA1C 5.4   CBG: No results for input(s): GLUCAP in the last 168 hours. Lipid Profile: No results for input(s): CHOL, HDL, LDLCALC, TRIG, CHOLHDL, LDLDIRECT in the last 72 hours. Thyroid Function Tests: No results for input(s): TSH, T4TOTAL, FREET4, T3FREE, THYROIDAB in the last 72 hours. Anemia Panel: No results for input(s): VITAMINB12, FOLATE, FERRITIN, TIBC, IRON, RETICCTPCT in the last 72 hours. Sepsis Labs: Recent Labs  Lab 04/25/19 0028  LATICACIDVEN 1.2    Recent Results (from the past 240 hour(s))  SARS Coronavirus 2 [facility] order, Performed in [facility] hospital lab) Nasopharyngeal Nasopharyngeal Swab     Status: None   Collection Time: 04/25/19  1:41 AM   Specimen: Nasopharyngeal Swab  Result Value Ref Range Status   SARS Coronavirus 2 NEGATIVE NEGATIVE Final    Comment: (NOTE) If result is NEGATIVE SARS-CoV-2 target nucleic acids are NOT DETECTED. The SARS-CoV-2 RNA is generally detectable in upper and lower  respiratory specimens during the acute phase of infection. The lowest  concentration of SARS-CoV-2 viral copies this assay can detect is 250   copies / mL. A negative result does not preclude SARS-CoV-2 infection  and should not be used as the sole basis for treatment or other  patient management decisions.  A negative result may occur with  improper specimen collection / handling, submission of specimen other  than nasopharyngeal swab, presence of viral mutation(s) within the  areas targeted by this assay, and inadequate number of viral copies  (<250 copies / mL). A negative result must be combined with clinical  observations, patient history, and epidemiological information. If result is POSITIVE SARS-CoV-2 target nucleic acids are DETECTED. The SARS-CoV-2 RNA is generally detectable in upper and lower  respiratory specimens dur ing the acute phase of infection.  Positive  results are indicative of active infection with SARS-CoV-2.  Clinical  correlation with patient history and other diagnostic information is  necessary to determine patient infection status.  Positive results do  not rule out bacterial infection or co-infection with other viruses. If  result is PRESUMPTIVE POSTIVE SARS-CoV-2 nucleic acids MAY BE PRESENT.   A presumptive positive result was obtained on the submitted specimen  and confirmed on repeat testing.  While 2019 novel coronavirus  (SARS-CoV-2) nucleic acids may be present in the submitted sample  additional confirmatory testing may be necessary for epidemiological  and / or clinical management purposes  to differentiate between  SARS-CoV-2 and other Sarbecovirus currently known to infect humans.  If clinically indicated additional testing with an alternate test  methodology [phone]) is advised. The SARS-CoV-2 RNA is generally  detectable in upper and lower respiratory sp ecimens during the acute  phase of infection. The expected result is Negative. Fact Sheet for Patients:  StrictlyIdeas.no Fact Sheet for Healthcare Providers: BankingDealers.co.za  This test is not yet approved or cleared by the [place] FDA and has been authorized for detection and/or diagnosis of SARS-CoV-2 by FDA under an Emergency Use Authorization (EUA).  This EUA will remain in effect (meaning this test can be used) for the duration of the COVID-19 declaration under Section 564(b)(1) of the Act, 21 U.S.C. section 360bbb-3(b)(1), unless the authorization is terminated or revoked sooner. Performed at [facility], [place] [address], [place], [place] [address]   MRSA PCR Screening     Status: None   Collection Time: 04/25/19  6:44 AM   Specimen: Nasal Mucosa; Nasopharyngeal  Result Value Ref Range Status   MRSA by PCR NEGATIVE NEGATIVE Final    Comment:        The GeneXpert MRSA Assay (FDA approved for NASAL specimens only), is one component of a comprehensive MRSA colonization surveillance program. It is not intended to diagnose MRSA infection nor to guide or monitor treatment for MRSA infections. Performed at [facility], [place] [address] [place], [place] [address]      Radiology Studies: No results found.  Scheduled Meds: . chlordiazePOXIDE  25 mg Oral BH-qamhs   Followed by  . [START ON 05/01/2019] chlordiazePOXIDE  25 mg Oral Daily  . enoxaparin (LOVENOX) injection  40 mg Subcutaneous Daily  . folic acid  1 mg Oral Daily  . LORazepam  0-4 mg Oral Q8H  . multivitamin with minerals  1 tablet Oral Daily  . thiamine  100 mg Oral Daily   Or  . thiamine  100 mg Intravenous Daily   Continuous Infusions:   LOS: 5 days   Marylu Lund, MD [facility] Pager On Amion  If 7PM-7AM, please contact night-coverage 04/30/2019, 2:49 PM

## 2019-05-01 NOTE — TOC Initial Note (Signed)
Transition of Care [facility]) - Initial/Assessment Note    Patient Details  Name: Vincent Peters MRN: [id number] Date of Birth: [birth year]  Transition of Care (TOC) CM/SW Contact:    Joaquin Courts, RN Phone Number: 05/01/2019, 1:47 PM  Clinical Narrative:      Pt in need of pcp, referred to the [facility] for follow-up.              Expected Discharge Plan: Home/Self Care Barriers to Discharge: No Barriers Identified   Patient Goals and CMS Choice Patient states their goals for this hospitalization and ongoing recovery are:: to go home      Expected Discharge Plan and Services Expected Discharge Plan: Home/Self Care   Discharge Planning Services: CM Consult   Living arrangements for the past 2 months: Single Family Home Expected Discharge Date: 05/01/19               DME Arranged: N/A DME Agency: NA       HH Arranged: NA [place] Agency: NA        Prior Living Arrangements/Services Living arrangements for the past 2 months: Single Family Home   Patient language and need for interpreter reviewed:: Yes Do you feel safe going back to the place where you live?: Yes      Need for Family Participation in Patient Care: Yes (Comment) Care giver support system in place?: Yes (comment)   Criminal Activity/Legal Involvement Pertinent to Current Situation/Hospitalization: No - Comment as needed  Activities of Daily Living Home Assistive Devices/Equipment: None ADL Screening (condition at time of admission) Patient's cognitive ability adequate to safely complete daily activities?: Yes Is the patient deaf or have difficulty hearing?: No Does the patient have difficulty seeing, even when wearing glasses/contacts?: No Does the patient have difficulty concentrating, remembering, or making decisions?: Yes Patient able to express need for assistance with ADLs?: Yes Does the patient have difficulty dressing or bathing?: No Independently performs ADLs?: Yes (appropriate  for developmental age) Does the patient have difficulty walking or climbing stairs?: No Weakness of Legs: None Weakness of Arms/Hands: None  Permission Sought/Granted                  Emotional Assessment Appearance:: Appears stated age Attitude/Demeanor/Rapport: Engaged Affect (typically observed): Accepting Orientation: : Oriented to Place, Oriented to  Time, Oriented to Situation, Oriented to Self   Psych Involvement: No (comment)  Admission diagnosis:  Fever; Alcohol Intoxication Patient Active Problem List   Diagnosis Date Noted  . Alcohol abuse with alcohol-induced mood disorder ([place]) 02/26/2018  . Alcohol withdrawal ([place]) 02/10/2014  . Hypokalemia 02/10/2014  . Thrombocytopenia ([place]) 02/10/2014  . Hypercalcemia 02/10/2014  . Delirium tremens ([place]) 02/10/2014   PCP:  Patient, No Pcp Per Pharmacy:   [place], [address] [place] [address] Phone: [phone] Fax: [phone]     Social Determinants of Health (SDOH) Interventions    Readmission Risk Interventions No flowsheet data found.

## 2019-05-01 NOTE — TOC Progression Note (Signed)
Transition of Care [facility]) - Progression Note    Patient Details  Name: Vincent Peters MRN: [id number] Date of Birth: 1981/03/18  Transition of Care [facility] CM/SW Contact  Joaquin Courts, RN Phone Number: 05/01/2019, 3:39 PM  Clinical Narrative:    Bedside RN provided with cab voucher.  Expected Discharge Plan: Home/Self Care Barriers to Discharge: No Barriers Identified  Expected Discharge Plan and Services Expected Discharge Plan: Home/Self Care   Discharge Planning Services: CM Consult   Living arrangements for the past 2 months: Single Family Home Expected Discharge Date: 05/01/19               DME Arranged: N/A DME Agency: NA       HH Arranged: NA HH Agency: NA         Social Determinants of Health (SDOH) Interventions    Readmission Risk Interventions No flowsheet data found.

## 2019-05-01 NOTE — Progress Notes (Signed)
PT Cancellation/Screen Note  Patient Details Name: Bastion Bolger MRN: [id number] DOB: 06/16/1981   Cancelled Treatment:    Reason Eval/Treat Not Completed: PT screened, no needs identified, will sign off. Per RN, PT eval not needed. Will sign off. Please reorder if needs change. Thanks.    Weston Anna, PT Acute Rehabilitation Services Pager: [phone] Office: [phone]

## 2019-05-01 NOTE — Discharge Summary (Signed)
Physician Discharge Summary  Vincent Peters AOZ:[id number] DOB: [birth year] DOA: 04/25/2019  PCP: Patient, No Pcp Per  Admit date: 04/25/2019 Discharge date: 05/01/2019  Admitted From: Home Disposition:  Home  Recommendations for Outpatient Follow-up:  1. Follow up with PCP in 1-2 weeks  Discharge Condition:Improved CODE STATUS:Full Diet recommendation: Regular   Brief/Interim Summary: 38 year old male with a history of alcohol and cocaine abuse. Was brought to the emergency roomafter being seen at behavioral health for admission evaluation and telling them that he thought he was COVID positive. He is COVID negative fever. He was sent here forevaluation of fever and cough. Initial temperature was 100.5 Fahrenheit although she has defervesced since admission to the emergency room. Chest x-ray was clear. White count was 9. Hehas become progressively combative while in the emergency room presumably secondary to ETOHwithdrawal with a less than 10 alcohol level and urine drug screen negative for cocaine.He became progressively combative in the emergency room following IV and was started on Precedex drip.  Pt since weaned off precedex and transferred to [facility]   Discharge Diagnoses:  Active Problems:   Alcohol withdrawal (HCC)  Active Problems:   Alcohol withdrawal (HCC)  Alcohol Abuse with Withdrawal and Delirium Tremens -Initially required Precedex drip not able to tolerate p.o. medications and started on Librium prn with precedex since weaned to off -Pt now stable, OK for d/c today. ETOH cessation done with interpreter  HypoNatremia; Hypochloremia -Improved and since normalized  Hypokalemia -normalized  Hyperglycemia -random glucose within normal limits -Hga1c of 5.4  Abnormal LFTs likely secondary to ETOH hepatitis -RUQ U/S notable for fatty liver -Acute Hepatitis Panel negative -LFT's trending down  Thrombocytopenia -In the setting of  Alcohol Abuse -resolved, most recent plt of 187  Discharge Instructions   Allergies as of 05/01/2019   No Known Allergies     Medication List    STOP taking these medications   ibuprofen 600 MG tablet Commonly known as: ADVIL      Follow-up Information    Follow up with your PCP in 1-2 weeks. Schedule an appointment as soon as possible for a visit.          No Known Allergies  Consultations:  PCCM  Procedures/Studies: Dg Chest Portable 1 View  Result Date: 04/25/2019 CLINICAL DATA:  Cough and fever EXAM: PORTABLE CHEST 1 VIEW COMPARISON:  01/17/2014 FINDINGS: The heart size and mediastinal contours are within normal limits. Both lungs are clear. The visualized skeletal structures are unremarkable. IMPRESSION: No active disease. Electronically Signed   By: Jasmine Pang M.D.   On: 04/25/2019 01:04   US Abdomen Limited Ruq  Result Date: 04/28/2019 CLINICAL DATA:  Abnormal liver function tests. EXAM: ULTRASOUND ABDOMEN LIMITED RIGHT UPPER QUADRANT COMPARISON:  None. FINDINGS: Gallbladder: No gallstones or wall thickening visualized. No sonographic Murphy sign noted by sonographer. Sludge ball in the neck of the gallbladder. Common bile duct: Diameter: 2.8 mm, normal Liver: No focal lesion identified. Slight increased echogenicity of the liver parenchyma which could represent hepatic steatosis. Portal vein is patent on color Doppler imaging with normal direction of blood flow towards the liver. Other: None. IMPRESSION: No acute abnormalities. Slight increased echogenicity of the liver parenchyma consistent with hepatic steatosis. Electronically Signed   By: Francene Boyers M.D.   On: 04/28/2019 10:09     Subjective: Eager to go home  Discharge Exam: Vitals:   04/30/19 2017 05/01/19 0451  BP: 133/90 117/88  Pulse: 79 69  Resp: 20 18  Temp: 98 F (36.7  C) 98 F (36.7 C)  SpO2: 99% 97%   Vitals:   04/30/19 0352 04/30/19 1437 04/30/19 2017 05/01/19 0451  BP: (!)  147/100 (!) 139/92 133/90 117/88  Pulse: 78 77 79 69  Resp: 18 (!) 21 20 18   Temp:  98.5 F (36.9 C) 98 F (36.7 C) 98 F (36.7 C)  TempSrc:  Oral Oral Oral  SpO2: 100% 99% 99% 97%  Weight:      Height:        General: Pt is alert, awake, not in acute distress Cardiovascular: RRR, S1/S2 +, no rubs, no gallops Respiratory: CTA bilaterally, no wheezing, no rhonchi Abdominal: Soft, NT, ND, bowel sounds + Extremities: no edema, no cyanosis   The results of significant diagnostics from this hospitalization (including imaging, microbiology, ancillary and laboratory) are listed below for reference.     Microbiology: Recent Results (from the past 240 hour(s))  SARS Coronavirus 2 [facility] order, Performed in [facility] hospital lab) Nasopharyngeal Nasopharyngeal Swab     Status: None   Collection Time: 04/25/19  1:41 AM   Specimen: Nasopharyngeal Swab  Result Value Ref Range Status   SARS Coronavirus 2 NEGATIVE NEGATIVE Final    Comment: (NOTE) If result is NEGATIVE SARS-CoV-2 target nucleic acids are NOT DETECTED. The SARS-CoV-2 RNA is generally detectable in upper and lower  respiratory specimens during the acute phase of infection. The lowest  concentration of SARS-CoV-2 viral copies this assay can detect is 250  copies / mL. A negative result does not preclude SARS-CoV-2 infection  and should not be used as the sole basis for treatment or other  patient management decisions.  A negative result may occur with  improper specimen collection / handling, submission of specimen other  than nasopharyngeal swab, presence of viral mutation(s) within the  areas targeted by this assay, and inadequate number of viral copies  (<250 copies / mL). A negative result must be combined with clinical  observations, patient history, and epidemiological information. If result is POSITIVE SARS-CoV-2 target nucleic acids are DETECTED. The SARS-CoV-2 RNA is generally detectable in upper and lower   respiratory specimens dur ing the acute phase of infection.  Positive  results are indicative of active infection with SARS-CoV-2.  Clinical  correlation with patient history and other diagnostic information is  necessary to determine patient infection status.  Positive results do  not rule out bacterial infection or co-infection with other viruses. If result is PRESUMPTIVE POSTIVE SARS-CoV-2 nucleic acids MAY BE PRESENT.   A presumptive positive result was obtained on the submitted specimen  and confirmed on repeat testing.  While 2019 novel coronavirus  (SARS-CoV-2) nucleic acids may be present in the submitted sample  additional confirmatory testing may be necessary for epidemiological  and / or clinical management purposes  to differentiate between  SARS-CoV-2 and other Sarbecovirus currently known to infect humans.  If clinically indicated additional testing with an alternate test  methodology [phone]) is advised. The SARS-CoV-2 RNA is generally  detectable in upper and lower respiratory sp ecimens during the acute  phase of infection. The expected result is Negative. Fact Sheet for Patients:  (LGX2119 Fact Sheet for Healthcare Providers: BoilerBrush.com.cy This test is not yet approved or cleared by the https://pope.com/ FDA and has been authorized for detection and/or diagnosis of SARS-CoV-2 by FDA under an Emergency Use Authorization (EUA).  This EUA will remain in effect (meaning this test can be used) for the duration of the COVID-19 declaration under Section 564(b)(1) of  the Act, 21 U.S.C. section 360bbb-3(b)(1), unless the authorization is terminated or revoked sooner. Performed at [facility], [address] [place], [place] [address]   MRSA PCR Screening     Status: None   Collection Time: 04/25/19  6:44 AM   Specimen: Nasal Mucosa; Nasopharyngeal  Result Value Ref Range Status   MRSA by PCR  NEGATIVE NEGATIVE Final    Comment:        The GeneXpert MRSA Assay (FDA approved for NASAL specimens only), is one component of a comprehensive MRSA colonization surveillance program. It is not intended to diagnose MRSA infection nor to guide or monitor treatment for MRSA infections. Performed at [facility], [address] [place], [place] [address]      Labs: BNP (last 3 results) No results for input(s): BNP in the last 8760 hours. Basic Metabolic Panel: Recent Labs  Lab 04/26/19 0915 04/27/19 0200 04/27/19 1402 04/28/19 0201 04/29/19 0158  NA 133* 135 134* 138 137  K 3.3* 3.7 4.3 3.9 3.9  CL 97* 100 99 103 101  CO2 27 25 26 26 26   GLUCOSE 133* 106* 112* 126* 94  BUN 6 9 10 9 11   CREATININE 0.45* 0.43* 0.63 0.48* 0.51*  CALCIUM 9.1 9.0 9.8 9.6 9.4  MG  --   --  1.6* 1.9  --   PHOS  --   --  3.5 4.7*  --    Liver Function Tests: Recent Labs  Lab 04/26/19 0915 04/27/19 0200 04/27/19 1402 04/28/19 0201 04/29/19 0158  AST 225* 161* 168* 223* 171*  ALT 177* 163* 179* 208* 223*  ALKPHOS 81 85 94 86 83  BILITOT 0.9 0.6 0.8 0.9 0.7  PROT 7.0 7.2 7.7 7.5 7.3  ALBUMIN 3.2* 3.1* 3.6 3.4* 3.4*   No results for input(s): LIPASE, AMYLASE in the last 168 hours. No results for input(s): AMMONIA in the last 168 hours. CBC: Recent Labs  Lab 04/25/19 0028 04/26/19 0915 04/27/19 0200 04/27/19 1402 04/28/19 0201 04/29/19 0158  WBC 9.0 6.0 9.0 9.3 7.8 10.9*  NEUTROABS 5.8 3.7 5.1  --  4.1 5.6  HGB 13.8 14.1 13.6 14.7 14.6 14.8  HCT 40.4 42.1 41.6 44.0 45.3 44.8  MCV 94.4 96.6 98.1 96.7 99.1 98.2  PLT 72* 91* 111* 159 149* 187   Cardiac Enzymes: No results for input(s): CKTOTAL, CKMB, CKMBINDEX, TROPONINI in the last 168 hours. BNP: Invalid input(s): POCBNP CBG: No results for input(s): GLUCAP in the last 168 hours. D-Dimer No results for input(s): DDIMER in the last 72 hours. Hgb A1c No results for input(s): HGBA1C in the last 72  hours. Lipid Profile No results for input(s): CHOL, HDL, LDLCALC, TRIG, CHOLHDL, LDLDIRECT in the last 72 hours. Thyroid function studies No results for input(s): TSH, T4TOTAL, T3FREE, THYROIDAB in the last 72 hours.  Invalid input(s): FREET3 Anemia work up No results for input(s): VITAMINB12, FOLATE, FERRITIN, TIBC, IRON, RETICCTPCT in the last 72 hours. Urinalysis    Component Value Date/Time   COLORURINE YELLOW 04/25/2019 0141   APPEARANCEUR CLEAR 04/25/2019 0141   LABSPEC 1.012 04/25/2019 0141   PHURINE 6.0 04/25/2019 0141   GLUCOSEU NEGATIVE 04/25/2019 0141   HGBUR MODERATE (A) 04/25/2019 0141   BILIRUBINUR NEGATIVE 04/25/2019 0141   KETONESUR 5 (A) 04/25/2019 0141   PROTEINUR 100 (A) 04/25/2019 0141   UROBILINOGEN 0.2 01/17/2014 0136   NITRITE NEGATIVE 04/25/2019 0141   LEUKOCYTESUR NEGATIVE 04/25/2019 0141   Sepsis Labs Invalid input(s): PROCALCITONIN,  WBC,  LACTICIDVEN Microbiology Recent Results (from the past 240 hour(s))  SARS Coronavirus 2 [facility] order, Performed in [facility] hospital lab) Nasopharyngeal Nasopharyngeal Swab     Status: None   Collection Time: 04/25/19  1:41 AM   Specimen: Nasopharyngeal Swab  Result Value Ref Range Status   SARS Coronavirus 2 NEGATIVE NEGATIVE Final    Comment: (NOTE) If result is NEGATIVE SARS-CoV-2 target nucleic acids are NOT DETECTED. The SARS-CoV-2 RNA is generally detectable in upper and lower  respiratory specimens during the acute phase of infection. The lowest  concentration of SARS-CoV-2 viral copies this assay can detect is 250  copies / mL. A negative result does not preclude SARS-CoV-2 infection  and should not be used as the sole basis for treatment or other  patient management decisions.  A negative result may occur with  improper specimen collection / handling, submission of specimen other  than nasopharyngeal swab, presence of viral mutation(s) within the  areas targeted by this assay, and inadequate  number of viral copies  (<250 copies / mL). A negative result must be combined with clinical  observations, patient history, and epidemiological information. If result is POSITIVE SARS-CoV-2 target nucleic acids are DETECTED. The SARS-CoV-2 RNA is generally detectable in upper and lower  respiratory specimens dur ing the acute phase of infection.  Positive  results are indicative of active infection with SARS-CoV-2.  Clinical  correlation with patient history and other diagnostic information is  necessary to determine patient infection status.  Positive results do  not rule out bacterial infection or co-infection with other viruses. If result is PRESUMPTIVE POSTIVE SARS-CoV-2 nucleic acids MAY BE PRESENT.   A presumptive positive result was obtained on the submitted specimen  and confirmed on repeat testing.  While 2019 novel coronavirus  (SARS-CoV-2) nucleic acids may be present in the submitted sample  additional confirmatory testing may be necessary for epidemiological  and / or clinical management purposes  to differentiate between  SARS-CoV-2 and other Sarbecovirus currently known to infect humans.  If clinically indicated additional testing with an alternate test  methodology [phone]) is advised. The SARS-CoV-2 RNA is generally  detectable in upper and lower respiratory sp ecimens during the acute  phase of infection. The expected result is Negative. Fact Sheet for Patients:  BoilerBrush.com.cy Fact Sheet for Healthcare Providers: https://pope.com/ This test is not yet approved or cleared by the [place] FDA and has been authorized for detection and/or diagnosis of SARS-CoV-2 by FDA under an Emergency Use Authorization (EUA).  This EUA will remain in effect (meaning this test can be used) for the duration of the COVID-19 declaration under Section 564(b)(1) of the Act, 21 U.S.C. section 360bbb-3(b)(1), unless the  authorization is terminated or revoked sooner. Performed at [facility], [address] [place], [place] [address]   MRSA PCR Screening     Status: None   Collection Time: 04/25/19  6:44 AM   Specimen: Nasal Mucosa; Nasopharyngeal  Result Value Ref Range Status   MRSA by PCR NEGATIVE NEGATIVE Final    Comment:        The GeneXpert MRSA Assay (FDA approved for NASAL specimens only), is one component of a comprehensive MRSA colonization surveillance program. It is not intended to diagnose MRSA infection nor to guide or monitor treatment for MRSA infections. Performed at [facility], [address], [place], [place] [address]    Time spent: 30 min  SIGNED:   Rickey Barbara, MD  [facility] 05/01/2019,  1:20 PM  If 7PM-7AM, please contact night-coverage

## 2019-05-03 ENCOUNTER — Telehealth: Payer: Self-pay

## 2019-05-03 NOTE — Telephone Encounter (Signed)
Message received from Nancy Marus, RN CM requesting a hospital follow up appointment for the patient. Vincent Peters scheduler attempted to contact the patient and was informed by the person who answered the phone that it was the wrong number.  The phone number for [facility] is on the AVS and patient will need to call to schedule an appointment.  Update provided to D. Alvira Monday, RN CM

## 2019-07-06 ENCOUNTER — Inpatient Hospital Stay (HOSPITAL_COMMUNITY): Payer: Self-pay

## 2019-07-06 ENCOUNTER — Inpatient Hospital Stay (HOSPITAL_COMMUNITY)
Admission: EM | Admit: 2019-07-06 | Discharge: 2019-07-17 | DRG: 897 | Disposition: A | Discharge status: Home / Self Care | Payer: Self-pay | Attending: Internal Medicine | Admitting: Internal Medicine

## 2019-07-06 ENCOUNTER — Encounter (HOSPITAL_COMMUNITY): Payer: Self-pay | Admitting: Emergency Medicine

## 2019-07-06 ENCOUNTER — Other Ambulatory Visit: Payer: Self-pay

## 2019-07-06 DIAGNOSIS — F1410 Cocaine abuse, uncomplicated: Secondary | ICD-10-CM | POA: Diagnosis present

## 2019-07-06 DIAGNOSIS — D6959 Other secondary thrombocytopenia: Secondary | ICD-10-CM | POA: Diagnosis present

## 2019-07-06 DIAGNOSIS — R000 Tachycardia, unspecified: Secondary | ICD-10-CM | POA: Diagnosis not present

## 2019-07-06 DIAGNOSIS — R32 Unspecified urinary incontinence: Secondary | ICD-10-CM | POA: Diagnosis present

## 2019-07-06 DIAGNOSIS — E876 Hypokalemia: Secondary | ICD-10-CM | POA: Diagnosis not present

## 2019-07-06 DIAGNOSIS — Z23 Encounter for immunization: Secondary | ICD-10-CM

## 2019-07-06 DIAGNOSIS — K7010 Alcoholic hepatitis without ascites: Secondary | ICD-10-CM | POA: Diagnosis present

## 2019-07-06 DIAGNOSIS — F10231 Alcohol dependence with withdrawal delirium: Principal | ICD-10-CM | POA: Diagnosis present

## 2019-07-06 DIAGNOSIS — D72829 Elevated white blood cell count, unspecified: Secondary | ICD-10-CM | POA: Diagnosis present

## 2019-07-06 DIAGNOSIS — R569 Unspecified convulsions: Secondary | ICD-10-CM | POA: Diagnosis present

## 2019-07-06 DIAGNOSIS — E7220 Disorder of urea cycle metabolism, unspecified: Secondary | ICD-10-CM | POA: Diagnosis present

## 2019-07-06 DIAGNOSIS — I10 Essential (primary) hypertension: Secondary | ICD-10-CM | POA: Diagnosis present

## 2019-07-06 DIAGNOSIS — F10931 Alcohol use, unspecified with withdrawal delirium: Secondary | ICD-10-CM

## 2019-07-06 DIAGNOSIS — F1390 Sedative, hypnotic, or anxiolytic use, unspecified, uncomplicated: Secondary | ICD-10-CM | POA: Diagnosis present

## 2019-07-06 DIAGNOSIS — Z20828 Contact with and (suspected) exposure to other viral communicable diseases: Secondary | ICD-10-CM | POA: Diagnosis present

## 2019-07-06 DIAGNOSIS — F17210 Nicotine dependence, cigarettes, uncomplicated: Secondary | ICD-10-CM | POA: Diagnosis present

## 2019-07-06 DIAGNOSIS — R7989 Other specified abnormal findings of blood chemistry: Secondary | ICD-10-CM

## 2019-07-06 DIAGNOSIS — F10239 Alcohol dependence with withdrawal, unspecified: Secondary | ICD-10-CM | POA: Diagnosis present

## 2019-07-06 DIAGNOSIS — D696 Thrombocytopenia, unspecified: Secondary | ICD-10-CM

## 2019-07-06 DIAGNOSIS — F10939 Alcohol use, unspecified with withdrawal, unspecified: Secondary | ICD-10-CM | POA: Diagnosis present

## 2019-07-06 DIAGNOSIS — G9349 Other encephalopathy: Secondary | ICD-10-CM | POA: Diagnosis present

## 2019-07-06 LAB — CBC
HCT: 43.1 % (ref 39.0–52.0)
HCT: 39.3 % (ref 39.0–52.0)
Hemoglobin: 14.3 g/dL (ref 13.0–17.0)
Hemoglobin: 13.6 g/dL (ref 13.0–17.0)
MCH: 32.1 pg (ref 26.0–34.0)
MCH: 32.9 pg (ref 26.0–34.0)
MCHC: 33.2 g/dL (ref 30.0–36.0)
MCHC: 34.6 g/dL (ref 30.0–36.0)
MCV: 96.9 fL (ref 80.0–100.0)
MCV: 94.9 fL (ref 80.0–100.0)
Platelets: 97 10*3/uL — ABNORMAL LOW (ref 150–400)
Platelets: 56 10*3/uL — ABNORMAL LOW (ref 150–400)
RBC: 4.45 MIL/uL (ref 4.22–5.81)
RBC: 4.14 MIL/uL — ABNORMAL LOW (ref 4.22–5.81)
RDW: 12.9 % (ref 11.5–15.5)
RDW: 13 % (ref 11.5–15.5)
WBC: 16 10*3/uL — ABNORMAL HIGH (ref 4.0–10.5)
WBC: 12.6 10*3/uL — ABNORMAL HIGH (ref 4.0–10.5)
nRBC: 0 % (ref 0.0–0.2)
nRBC: 0 % (ref 0.0–0.2)

## 2019-07-06 LAB — HEPATIC FUNCTION PANEL
ALT: 61 U/L — ABNORMAL HIGH (ref 0–44)
AST: 90 U/L — ABNORMAL HIGH (ref 15–41)
Albumin: 3.4 g/dL — ABNORMAL LOW (ref 3.5–5.0)
Alkaline Phosphatase: 89 U/L (ref 38–126)
Bilirubin, Direct: 0.3 mg/dL — ABNORMAL HIGH (ref 0.0–0.2)
Indirect Bilirubin: 1.4 mg/dL — ABNORMAL HIGH (ref 0.3–0.9)
Total Bilirubin: 1.7 mg/dL — ABNORMAL HIGH (ref 0.3–1.2)
Total Protein: 8.1 g/dL (ref 6.5–8.1)

## 2019-07-06 LAB — BASIC METABOLIC PANEL
Anion gap: 24 — ABNORMAL HIGH (ref 5–15)
Anion gap: 15 (ref 5–15)
BUN: 11 mg/dL (ref 6–20)
BUN: 16 mg/dL (ref 6–20)
CO2: 20 mmol/L — ABNORMAL LOW (ref 22–32)
CO2: 21 mmol/L — ABNORMAL LOW (ref 22–32)
Calcium: 9.3 mg/dL (ref 8.9–10.3)
Calcium: 8.9 mg/dL (ref 8.9–10.3)
Chloride: 99 mmol/L (ref 98–111)
Chloride: 105 mmol/L (ref 98–111)
Creatinine, Ser: 0.71 mg/dL (ref 0.61–1.24)
Creatinine, Ser: 0.74 mg/dL (ref 0.61–1.24)
GFR calc Af Amer: >60 mL/min (ref >60)
GFR calc Af Amer: >60 mL/min (ref >60)
GFR calc non Af Amer: >60 mL/min (ref >60)
GFR calc non Af Amer: >60 mL/min (ref >60)
Glucose, Bld: 132 mg/dL — ABNORMAL HIGH (ref 70–99)
Glucose, Bld: 114 mg/dL — ABNORMAL HIGH (ref 70–99)
Potassium: 3.9 mmol/L (ref 3.5–5.1)
Potassium: 3.9 mmol/L (ref 3.5–5.1)
Sodium: 143 mmol/L (ref 135–145)
Sodium: 141 mmol/L (ref 135–145)

## 2019-07-06 LAB — MAGNESIUM: Magnesium: 1.7 mg/dL (ref 1.7–2.4)

## 2019-07-06 LAB — CREATININE, SERUM
Creatinine, Ser: 0.78 mg/dL (ref 0.61–1.24)
GFR calc Af Amer: >60 mL/min (ref >60)
GFR calc non Af Amer: >60 mL/min (ref >60)

## 2019-07-06 LAB — PHOSPHORUS: Phosphorus: 3 mg/dL (ref 2.5–4.6)

## 2019-07-06 LAB — ETHANOL: Alcohol, Ethyl (B): <10 mg/dL (ref <10)

## 2019-07-06 LAB — LIPASE, BLOOD: Lipase: 29 U/L (ref 11–51)

## 2019-07-06 LAB — LACTIC ACID, PLASMA
Lactic Acid, Venous: 1 mmol/L (ref 0.5–1.9)
Lactic Acid, Venous: 0.9 mmol/L (ref 0.5–1.9)

## 2019-07-06 LAB — SARS CORONAVIRUS 2 (TAT 6-24 HRS): SARS Coronavirus 2: NEGATIVE

## 2019-07-06 LAB — MRSA PCR SCREENING: MRSA by PCR: NEGATIVE

## 2019-07-06 LAB — PROTIME-INR
INR: 1.1 (ref 0.8–1.2)
Prothrombin Time: 13.7 seconds (ref 11.4–15.2)

## 2019-07-06 LAB — APTT: aPTT: 30 seconds (ref 24–36)

## 2019-07-06 MED ORDER — FOLIC ACID 1 MG PO TABS
1.0000 mg | ORAL_TABLET | Freq: Every day | ORAL | Status: DC
  Administered 2019-07-10 – 2019-07-11 (×2): 1 mg via ORAL
  Filled 2019-07-06 (×2): qty 1

## 2019-07-06 MED ORDER — LORAZEPAM 2 MG/ML IJ SOLN
2.0000 mg | Freq: Once | INTRAMUSCULAR | Status: AC
  Administered 2019-07-06: 2 mg via INTRAVENOUS
  Filled 2019-07-06: qty 1

## 2019-07-06 MED ORDER — LACTATED RINGERS IV SOLN
INTRAVENOUS | Status: DC
  Administered 2019-07-06 – 2019-07-08 (×4): via INTRAVENOUS

## 2019-07-06 MED ORDER — LORAZEPAM 1 MG PO TABS: 1.0000 mg | ORAL_TABLET | ORAL | Status: AC | PRN

## 2019-07-06 MED ORDER — LORAZEPAM 1 MG PO TABS: 0.0000 mg | ORAL_TABLET | Freq: Four times a day (QID) | ORAL | Status: DC

## 2019-07-06 MED ORDER — THIAMINE HCL 100 MG/ML IJ SOLN
100.0000 mg | Freq: Every day | INTRAMUSCULAR | Status: DC
  Administered 2019-07-06 – 2019-07-12 (×5): 100 mg via INTRAVENOUS
  Filled 2019-07-06 (×5): qty 2

## 2019-07-06 MED ORDER — ONDANSETRON HCL 4 MG/2ML IJ SOLN
4.0000 mg | Freq: Four times a day (QID) | INTRAMUSCULAR | Status: DC | PRN
  Administered 2019-07-08: 4 mg via INTRAVENOUS

## 2019-07-06 MED ORDER — ACETAMINOPHEN 325 MG PO TABS
650.0000 mg | ORAL_TABLET | ORAL | Status: DC | PRN
  Administered 2019-07-10 – 2019-07-13 (×3): 650 mg via ORAL
  Filled 2019-07-06 (×3): qty 2

## 2019-07-06 MED ORDER — LABETALOL HCL 5 MG/ML IV SOLN
10.0000 mg | INTRAVENOUS | Status: DC | PRN
  Administered 2019-07-07: 10 mg via INTRAVENOUS
  Filled 2019-07-06: qty 4

## 2019-07-06 MED ORDER — LORAZEPAM 2 MG/ML IJ SOLN: 0.0000 mg | Freq: Two times a day (BID) | INTRAMUSCULAR | Status: DC

## 2019-07-06 MED ORDER — ONDANSETRON HCL 4 MG/2ML IJ SOLN
4.0000 mg | Freq: Once | INTRAMUSCULAR | Status: DC
  Filled 2019-07-06: qty 2

## 2019-07-06 MED ORDER — VITAMIN B-1 100 MG PO TABS
100.0000 mg | ORAL_TABLET | Freq: Every day | ORAL | Status: DC
  Administered 2019-07-10 – 2019-07-11 (×2): 100 mg via ORAL
  Filled 2019-07-06 (×2): qty 1

## 2019-07-06 MED ORDER — LORAZEPAM 2 MG/ML IJ SOLN
0.0000 mg | Freq: Four times a day (QID) | INTRAMUSCULAR | Status: DC
  Administered 2019-07-06: 2 mg via INTRAVENOUS
  Filled 2019-07-06 (×2): qty 1

## 2019-07-06 MED ORDER — SODIUM CHLORIDE 0.9 % IV BOLUS
1000.0000 mL | Freq: Once | INTRAVENOUS | Status: AC
  Administered 2019-07-06: 1000 mL via INTRAVENOUS

## 2019-07-06 MED ORDER — DEXMEDETOMIDINE HCL IN NACL 400 MCG/100ML IV SOLN
0.2000 ug/kg/h | INTRAVENOUS | Status: AC
  Administered 2019-07-06: 1.2 ug/kg/h via INTRAVENOUS
  Administered 2019-07-06: 0.7 ug/kg/h via INTRAVENOUS
  Administered 2019-07-07 (×3): 1.2 ug/kg/h via INTRAVENOUS
  Administered 2019-07-07: 1 ug/kg/h via INTRAVENOUS
  Administered 2019-07-08: 0.9 ug/kg/h via INTRAVENOUS
  Administered 2019-07-08 (×2): 1.2 ug/kg/h via INTRAVENOUS
  Filled 2019-07-06 (×9): qty 100

## 2019-07-06 MED ORDER — LORAZEPAM 2 MG/ML IJ SOLN: 1.0000 mg | INTRAMUSCULAR | Status: DC | PRN

## 2019-07-06 MED ORDER — DEXMEDETOMIDINE HCL IN NACL 400 MCG/100ML IV SOLN
0.4000 ug/kg/h | INTRAVENOUS | Status: DC
  Filled 2019-07-06: qty 100

## 2019-07-06 MED ORDER — ADULT MULTIVITAMIN W/MINERALS CH
1.0000 | ORAL_TABLET | Freq: Every day | ORAL | Status: DC
  Administered 2019-07-11 – 2019-07-17 (×7): 1 via ORAL
  Filled 2019-07-06 (×7): qty 1

## 2019-07-06 MED ORDER — LORAZEPAM 2 MG/ML IJ SOLN
2.0000 mg | Freq: Once | INTRAMUSCULAR | Status: DC
  Filled 2019-07-06: qty 1

## 2019-07-06 MED ORDER — SODIUM CHLORIDE 0.9 % IV BOLUS: 1000.0000 mL | Freq: Once | INTRAVENOUS | Status: DC

## 2019-07-06 MED ORDER — HEPARIN SODIUM (PORCINE) 5000 UNIT/ML IJ SOLN: 5000.0000 [IU] | Freq: Three times a day (TID) | INTRAMUSCULAR | Status: DC

## 2019-07-06 MED ORDER — CHLORHEXIDINE GLUCONATE CLOTH 2 % EX PADS
6.0000 | MEDICATED_PAD | Freq: Every day | CUTANEOUS | Status: DC
  Administered 2019-07-08 – 2019-07-16 (×4): 6 via TOPICAL

## 2019-07-06 MED ORDER — LORAZEPAM 2 MG/ML IJ SOLN: 2.0000 mg | Freq: Once | INTRAMUSCULAR | Status: DC

## 2019-07-06 MED ORDER — LORAZEPAM 2 MG/ML IJ SOLN
1.0000 mg | INTRAMUSCULAR | Status: AC | PRN
  Administered 2019-07-06: 4 mg via INTRAVENOUS
  Administered 2019-07-07: 2 mg via INTRAVENOUS
  Administered 2019-07-07 (×2): 4 mg via INTRAVENOUS
  Administered 2019-07-07 (×2): 2 mg via INTRAVENOUS
  Administered 2019-07-08: 4 mg via INTRAVENOUS
  Administered 2019-07-08 (×2): 2 mg via INTRAVENOUS
  Administered 2019-07-08: 4 mg via INTRAVENOUS
  Administered 2019-07-08 (×2): 2 mg via INTRAVENOUS
  Administered 2019-07-09: 4 mg via INTRAVENOUS
  Administered 2019-07-09: 2 mg via INTRAVENOUS
  Administered 2019-07-09 (×2): 4 mg via INTRAVENOUS
  Administered 2019-07-09 (×2): 2 mg via INTRAVENOUS
  Filled 2019-07-06 (×2): qty 1
  Filled 2019-07-06: qty 2
  Filled 2019-07-06: qty 1
  Filled 2019-07-06: qty 2
  Filled 2019-07-06: qty 1
  Filled 2019-07-06: qty 2
  Filled 2019-07-06: qty 1
  Filled 2019-07-06 (×3): qty 2
  Filled 2019-07-06: qty 1
  Filled 2019-07-06 (×2): qty 2
  Filled 2019-07-06 (×3): qty 1
  Filled 2019-07-06: qty 2
  Filled 2019-07-06: qty 1
  Filled 2019-07-06: qty 2
  Filled 2019-07-06: qty 1

## 2019-07-06 MED ORDER — FAMOTIDINE IN NACL 20-0.9 MG/50ML-% IV SOLN
20.0000 mg | Freq: Two times a day (BID) | INTRAVENOUS | Status: DC
  Administered 2019-07-06 – 2019-07-11 (×10): 20 mg via INTRAVENOUS
  Filled 2019-07-06 (×10): qty 50

## 2019-07-06 MED ORDER — LORAZEPAM 1 MG PO TABS: 0.0000 mg | ORAL_TABLET | Freq: Two times a day (BID) | ORAL | Status: DC

## 2019-07-06 NOTE — ED Provider Notes (Signed)
[facility] EMERGENCY DEPARTMENT Provider Note   CSN: [id number] Arrival date & time: 07/06/19  0945     History   Chief Complaint Chief Complaint  Patient presents with  . Seizures    HPI Vincent Peters is a 38 y.o. male.     HPI Patient presents after being found following seizure-like activity. Individuals witnessed this are not present, the patient denies history of seizures. He notes that he feels weak, shaky, has some abdominal discomfort, in the upper abdomen, right-sided, that has been present for a while. He denies vomiting, does have nausea. Patient knowledges a history of alcohol use, states that he last drank about 2 days ago. He denies other drug use.  Patient is a reluctant historian, does not provide additional details.  Reports patient was found after reportedly having seizure-like activity, being found on the side of the road, though none of these individuals are present. EMS reports the patient appeared postictal on arrival, had urinary incontinence, smelled of alcohol.  Past Medical History:  Diagnosis Date  . Alcohol abuse   . Cocaine abuse [facility])     Patient Active Problem List   Diagnosis Date Noted  . Alcohol abuse with alcohol-induced mood disorder (HCC) 02/26/2018  . Alcohol withdrawal (HCC) 02/10/2014  . Hypokalemia 02/10/2014  . Thrombocytopenia (HCC) 02/10/2014  . Hypercalcemia 02/10/2014  . Delirium tremens (HCC) 02/10/2014    History reviewed. No pertinent surgical history.      Home Medications    Prior to Admission medications   Not on File    Family History No family history on file.  Social History Social History   Tobacco Use  . Smoking status: Light Tobacco Smoker    Types: Cigarettes  . Smokeless tobacco: Never Used  Substance Use Topics  . Alcohol use: Yes    Alcohol/week: 168.0 standard drinks    Types: 168 Cans of beer per week  . Drug use: Yes    Types: Cocaine      Allergies   Patient has no known allergies.   Review of Systems Review of Systems  Unable to perform ROS: Other   Limited secondary to the patient's mental status, withdrawn nature, possible postictal phase.  Physical Exam Updated Vital Signs BP (!) 177/102   Pulse (!) 136   Temp 99.1 F (37.3 C) (Oral)   Resp (!) 24   SpO2 98%   Physical Exam Vitals signs and nursing note reviewed.  Constitutional:      General: He is not in acute distress.    Appearance: He is well-developed.     Comments: Malodorous, disheveled adult male interacting occasionally.  HENT:     Head: Normocephalic and atraumatic.  Eyes:     Conjunctiva/sclera: Conjunctivae normal.  Cardiovascular:     Rate and Rhythm: Normal rate and regular rhythm.  Pulmonary:     Effort: Pulmonary effort is normal. No respiratory distress.     Breath sounds: No stridor.  Abdominal:     General: There is no distension.     Tenderness: There is abdominal tenderness in the right upper quadrant and epigastric area.  Skin:    General: Skin is warm and dry.  Neurological:     General: No focal deficit present.     Mental Status: He is alert.     Motor: Tremor present.  Psychiatric:        Behavior: Behavior is agitated.      ED Treatments / Results  Labs (all labs  ordered are listed, but only abnormal results are displayed) Labs Reviewed  BASIC METABOLIC PANEL - Abnormal; Notable for the following components:      Result Value   CO2 20 (*)    Glucose, Bld 132 (*)    Anion gap 24 (*)    All other components within normal limits  CBC - Abnormal; Notable for the following components:   WBC 16.0 (*)    Platelets 97 (*)    All other components within normal limits  HEPATIC FUNCTION PANEL - Abnormal; Notable for the following components:   Albumin 3.4 (*)    AST 90 (*)    ALT 61 (*)    Total Bilirubin 1.7 (*)    Bilirubin, Direct 0.3 (*)    Indirect Bilirubin 1.4 (*)    All other components within normal  limits  SARS CORONAVIRUS 2 (TAT 6-24 HRS)  ETHANOL  LIPASE, BLOOD    Radiology No results found.  Procedures Procedures (including critical care time)  CRITICAL CARE Performed by: Carmin Muskrat Total critical care time: 35 minutes Critical care time was exclusive of separately billable procedures and treating other patients. Critical care was necessary to treat or prevent imminent or life-threatening deterioration. Critical care was time spent personally by me on the following activities: development of treatment plan with patient and/or surrogate as well as nursing, discussions with consultants, evaluation of patient's response to treatment, examination of patient, obtaining history from patient or surrogate, ordering and performing treatments and interventions, ordering and review of laboratory studies, ordering and review of radiographic studies, pulse oximetry and re-evaluation of patient's condition.   Medications Ordered in ED Medications  LORazepam (ATIVAN) injection 0-4 mg (2 mg Intravenous Given 07/06/19 1337)    Or  LORazepam (ATIVAN) tablet 0-4 mg ( Oral See Alternative 07/06/19 1337)  LORazepam (ATIVAN) injection 0-4 mg (has no administration in time range)    Or  LORazepam (ATIVAN) tablet 0-4 mg (has no administration in time range)  thiamine (VITAMIN B-1) tablet 100 mg ( Oral See Alternative 07/06/19 1254)    Or  thiamine (B-1) injection 100 mg (100 mg Intravenous Given 07/06/19 1254)  ondansetron (ZOFRAN) injection 4 mg (has no administration in time range)  LORazepam (ATIVAN) injection 2 mg (has no administration in time range)  dexmedetomidine (PRECEDEX) 400 MCG/100ML (4 mcg/mL) infusion (has no administration in time range)  LORazepam (ATIVAN) injection 2 mg (2 mg Intravenous Given 07/06/19 1220)  sodium chloride 0.9 % bolus 1,000 mL (1,000 mLs Intravenous New Bag/Given 07/06/19 1306)     Initial Impression / Assessment and Plan / ED Course  I have reviewed the  triage vital signs and the nursing notes.  Pertinent labs & imaging results that were available during my care of the patient were reviewed by me and considered in my medical decision making (see chart for details).    Chart review after the initial evaluation notable for history of alcohol abuse, cocaine abuse, prior seizures.  Patient was on Precedex, within the past month.    Update: Patient had episode of seizure activity, alcohol withdrawal patient found lying perpendicular on his bed, not following commands, grabbing at his mask, Ativan provided.  2:33 PM Patient continues to be agitated, trying to climb from the bed. Airway is protected, but given the patient's labile status, the patient will require continued CIWA Ativan protocol, has received 6 mg so far, and initiation of Precedex. I have discussed this case with our critical care colleagues for admission.  This  adult male with history of alcohol abuse, cocaine abuse, prior alcohol withdrawal as well as seizures now presents after possible seizure. Here the patient has 1 witnessed seizure, is persistently agitated after awakening from postictal phase, requiring substantial amount of Ativan, initiation of Precedex, admission for further monitoring, management  Final Clinical Impressions(s) / ED Diagnoses  Alcohol withdrawal syndrome with delirium   Gerhard MunchLockwood, Monty Mccarrell, MD 07/06/19 1435

## 2019-07-06 NOTE — ED Notes (Signed)
Called main pharm to get Precedex

## 2019-07-06 NOTE — ED Triage Notes (Signed)
Pt to triage via [facility].  Found lying on side of road with report of ? Seizure like activity.  EMS states pt appeared postictal on their arrival, had urinary incontinence,  and smelled of ETOH.  Pt has non-productive cough and c/o being thirsty.  Denies history of seizures.

## 2019-07-06 NOTE — ED Notes (Addendum)
When into room to medicate pt and he had a seizure. He is combative and ripped the syringe from my hand spilling the medication. Called for help and was able to medicate 2mg  ativan pt while others held and distracted him.

## 2019-07-06 NOTE — H&P (Addendum)
NAME:  Vincent Peters, MRN:  [id number], DOB:  09-03-1980, LOS: 0 ADMISSION DATE:  07/06/2019, CONSULTATION DATE:  12/2 REFERRING MD:  Gerhard MunchLockwood, Robert, CHIEF COMPLAINT:  Seizure related to ETOH withdrawal    Brief History   38yo presented to ED after being found down with what appeared to be seizure like activity. PCCM consulted for admission given need for precedex drip for control of alcohol withdrawal.   History of present illness   History obtain per chart review as patient is currently significantly altered and agitated.   [name] is a 38 yo with a PMX of cocaine and alcohol abuse who was brought in via EMS after bring found down on the side of the road with seizure like activity, per EMS incontinent of bladder at seen. No known history of seizures. On admission patient reported some abdominal pain and weakness. Reported last drink was 2 days prior to admission. Per chart review patient has suffered with substance abuse for many years. Per last ED visit, 02/26/2018, patient reported drinking 12 cans of beer a day and has been drinking this amount for 16 plus years.   Patient with witnessed seizure in ED with significant agitation and tremors. PRN ativan administered with little to no relief seen therefore precedex drip was initiated. Vitals signs significant for tachycardia and hypertension. Lab work with slightly decreased, elevated LFTs and mild leukocytosis.   Past Medical History  Alcohol abuse Cocaine abuse  Significant Hospital Events     Consults:  PCCM   Procedures:    Significant Diagnostic Tests:  12/2: CTH >> 12/2: ETOH Level < 10  Micro Data:  12/2 COVID test >>  Antimicrobials:    Interim history/subjective:  New admit, see HPI.   Objective   Blood pressure (!) 177/102, pulse (!) 136, temperature 99.1 F (37.3 C), temperature source Oral, resp. rate (!) 24, weight 61.7 kg, SpO2 98 %.        Intake/Output Summary  (Last 24 hours) at 07/06/2019 1552 Last data filed at 07/06/2019 1406 Gross per 24 hour  Intake 1000 ml  Output -  Net 1000 ml   Filed Weights   07/06/19 1400  Weight: 61.7 kg    Examination: General: Chronically ill adult  Male lying in bed  in NAD HEENT: [place]/AT, MM pink/moist, PERRL,  Neuro: very agitated and altered with inability to follow commands, trimerous  CV: s1s2 regular rate and rhythm, no murmur, rubs, or gallops,  PULM:  Clear to ascultation bilaterally  GI: soft, bowel sounds active in all 4 quadrants, non-tender, non-distended Extremities: warm/dry, no edema  Skin: no rashes or lesions   Resolved Hospital Problem list     Assessment & Plan:  Seizure in setting of ETOH w/d -- ETOH level < 10, reported last ETOH consumption 48 hours prior to presentation -- h/o alcohol w/d with seizures in Sept of this year --Glucose Normal on presentation, no evidence of infection (Afebrile) P:  -- UDS pending -- Thiamine -- Second seizure requiring hospitalization since Sept.  Will obtain CTH to r/o CNS etiology, likely w/d.  -- ETOH w/d protocol: Benzo + Precedex (Precedex for severe agitation, will not treat w/d).  If unresponsive to benzo would consider addition of phenobarbital.    Hypertension --Goal SBP < 170 --PRN Labetalol  Elevated LFTs Thrombocytopenia --Plt count 187 in Sept. 2020 --Hepatitis panel negative Sept 2020 --Abdominal ultrasound pending --Pending INR --Continue to trend. Likely secondary to ETOH use  Leukocytosis --likely reactive.  Afebrile.  --  If spikes fever, will culture.   Acidosis --suspect lactic acidosis in setting of seizure.  No lactate obtained in ED --Repeat BMP and lactate  Best practice:  Diet: NPO Pain/Anxiety/Delirium protocol (if indicated): Benzo + Precedex VAP protocol (if indicated): NA DVT prophylaxis: Hold subq Heparin with plt count 56 GI prophylaxis: Pepcid Glucose control: Glucose < 180, no SSI Mobility: As  tolerated Code Status: FULL Family Communication: no family at bedside  Disposition: Admit to ICU with expected length of stay > 2 nights for treatment and evaluation of seizure like activity secondary to ETOH w/d.   Labs   CBC: Recent Labs  Lab 07/06/19 1006  WBC 16.0*  HGB 14.3  HCT 43.1  MCV 96.9  PLT 97*    Basic Metabolic Panel: Recent Labs  Lab 07/06/19 1006  NA 143  K 3.9  CL 99  CO2 20*  GLUCOSE 132*  BUN 11  CREATININE 0.71  CALCIUM 9.3   GFR: Estimated Creatinine Clearance: 110 mL/min (by C-G formula based on SCr of 0.71 mg/dL). Recent Labs  Lab 07/06/19 1006  WBC 16.0*    Liver Function Tests: Recent Labs  Lab 07/06/19 1300  AST 90*  ALT 61*  ALKPHOS 89  BILITOT 1.7*  PROT 8.1  ALBUMIN 3.4*   Recent Labs  Lab 07/06/19 1300  LIPASE 29   No results for input(s): AMMONIA in the last 168 hours.  ABG No results found for: PHART, PCO2ART, PO2ART, HCO3, TCO2, ACIDBASEDEF, O2SAT   Coagulation Profile: No results for input(s): INR, PROTIME in the last 168 hours.  Cardiac Enzymes: No results for input(s): CKTOTAL, CKMB, CKMBINDEX, TROPONINI in the last 168 hours.  HbA1C: Hgb A1c MFr Bld  Date/Time Value Ref Range Status  04/28/2019 02:01 AM 5.4 4.8 - 5.6 % Final    Comment:    (NOTE)         Prediabetes: 5.7 - 6.4         Diabetes: >6.4         Glycemic control for adults with diabetes: <7.0     CBG: No results for input(s): GLUCAP in the last 168 hours.  Review of Systems:   Unable to obtain due to clinical condition/agitation/confusion  Past Medical History  He,  has a past medical history of Alcohol abuse and Cocaine abuse ([place]).   Surgical History   History reviewed. No pertinent surgical history.   Social History   reports that he has been smoking cigarettes. He has never used smokeless tobacco. He reports current alcohol use of about 168.0 standard drinks of alcohol per week. He reports current drug use. Drug: Cocaine.    Family History   His family history is not on file.   Allergies No Known Allergies   Home Medications  Prior to Admission medications   Not on File     Critical care time: 40 min    CRITICAL CARE Performed by: Johnsie Cancel   Total critical care time: 40 minutes  Critical care time was exclusive of separately billable procedures and treating other patients.  Critical care was necessary to treat or prevent imminent or life-threatening deterioration.  Critical care was time spent personally by me on the following activities: development of treatment plan with patient and/or surrogate as well as nursing, discussions with consultants, evaluation of patient's response to treatment, examination of patient, obtaining history from patient or surrogate, ordering and performing treatments and interventions, ordering and review of laboratory studies, ordering and review of radiographic  studies, pulse oximetry and re-evaluation of patient's condition.    Delfin Gant, NP-C [facility] After hours pager: [phone]. 07/06/2019, 6:07 PM

## 2019-07-07 LAB — CBC
HCT: 38.6 % — ABNORMAL LOW (ref 39.0–52.0)
Hemoglobin: 13 g/dL (ref 13.0–17.0)
MCH: 32.3 pg (ref 26.0–34.0)
MCHC: 33.7 g/dL (ref 30.0–36.0)
MCV: 95.8 fL (ref 80.0–100.0)
Platelets: 50 10*3/uL — ABNORMAL LOW (ref 150–400)
RBC: 4.03 MIL/uL — ABNORMAL LOW (ref 4.22–5.81)
RDW: 12.9 % (ref 11.5–15.5)
WBC: 8.2 10*3/uL (ref 4.0–10.5)
nRBC: 0 % (ref 0.0–0.2)

## 2019-07-07 LAB — URINALYSIS, ROUTINE W REFLEX MICROSCOPIC
Bilirubin Urine: NEGATIVE
Glucose, UA: NEGATIVE mg/dL
Ketones, ur: 80 mg/dL — AB
Leukocytes,Ua: NEGATIVE
Nitrite: NEGATIVE
Protein, ur: 100 mg/dL — AB
Specific Gravity, Urine: 1.028 (ref 1.005–1.030)
pH: 6 (ref 5.0–8.0)

## 2019-07-07 LAB — BASIC METABOLIC PANEL
Anion gap: 13 (ref 5–15)
BUN: 16 mg/dL (ref 6–20)
CO2: 23 mmol/L (ref 22–32)
Calcium: 9 mg/dL (ref 8.9–10.3)
Chloride: 106 mmol/L (ref 98–111)
Creatinine, Ser: 0.54 mg/dL — ABNORMAL LOW (ref 0.61–1.24)
GFR calc Af Amer: >60 mL/min (ref >60)
GFR calc non Af Amer: >60 mL/min (ref >60)
Glucose, Bld: 105 mg/dL — ABNORMAL HIGH (ref 70–99)
Potassium: 3.4 mmol/L — ABNORMAL LOW (ref 3.5–5.1)
Sodium: 142 mmol/L (ref 135–145)

## 2019-07-07 LAB — RAPID URINE DRUG SCREEN, HOSP PERFORMED
Amphetamines: NOT DETECTED
Barbiturates: NOT DETECTED
Benzodiazepines: POSITIVE — AB
Cocaine: NOT DETECTED
Opiates: NOT DETECTED
Tetrahydrocannabinol: NOT DETECTED

## 2019-07-07 LAB — PHOSPHORUS: Phosphorus: 3.1 mg/dL (ref 2.5–4.6)

## 2019-07-07 LAB — MAGNESIUM: Magnesium: 1.8 mg/dL (ref 1.7–2.4)

## 2019-07-07 MED ORDER — POTASSIUM CHLORIDE CRYS ER 20 MEQ PO TBCR: 40.0000 meq | EXTENDED_RELEASE_TABLET | Freq: Two times a day (BID) | ORAL | Status: AC

## 2019-07-07 MED ORDER — LABETALOL HCL 5 MG/ML IV SOLN
20.0000 mg | INTRAVENOUS | Status: DC | PRN
  Administered 2019-07-10 – 2019-07-13 (×2): 20 mg via INTRAVENOUS
  Filled 2019-07-07 (×2): qty 4

## 2019-07-07 MED ORDER — LABETALOL HCL 5 MG/ML IV SOLN
10.0000 mg | Freq: Once | INTRAVENOUS | Status: AC
  Administered 2019-07-07: 10 mg via INTRAVENOUS
  Filled 2019-07-07: qty 4

## 2019-07-07 MED ORDER — HYDRALAZINE HCL 20 MG/ML IJ SOLN
10.0000 mg | INTRAMUSCULAR | Status: DC | PRN
  Administered 2019-07-07 – 2019-07-14 (×9): 10 mg via INTRAVENOUS
  Filled 2019-07-07 (×9): qty 1

## 2019-07-07 NOTE — Progress Notes (Signed)
eLink Physician-Brief Progress Note Patient Name: Vincent Peters DOB: [birth year] MRN: [id number]   Date of Service  07/07/2019  HPI/Events of Note  Hypertension - BP = 169/101.  eICU Interventions  Will order: 1. Labetalol 10 mg IV now (extra dose). 2. Increase Labetalol to 20 mg IV Q 2 hours PRN.      Intervention Category Major Interventions: Hypertension - evaluation and management  Cymone Yeske Eugene 07/07/2019, 2:51 AM

## 2019-07-07 NOTE — Progress Notes (Signed)
eLink Physician-Brief Progress Note Patient Name: Vincent Peters DOB: Sep 11, 1980 MRN: [id number]   Date of Service  07/07/2019  HPI/Events of Note  Oliguria - Bladder scan with 762 mL residual.   eICU Interventions  Will order: 1. I/O Cath PRN.      Intervention Category Intermediate Interventions: Oliguria - evaluation and management  Sommer,Steven Eugene 07/07/2019, 1:13 AM

## 2019-07-07 NOTE — H&P (Signed)
NAMEDouglas Peters, MRN:  [id number], DOB:  Apr 15, 1981, LOS: 1 ADMISSION DATE:  07/06/2019, CONSULTATION DATE:  12/2 REFERRING MD:  Carmin Muskrat, CHIEF COMPLAINT:  Seizure related to ETOH withdrawal    Brief History   38yo presented to ED after being found down with what appeared to be seizure like activity. PCCM consulted for admission given need for precedex drip for control of alcohol withdrawal.   History of present illness   History obtain per chart review as patient is currently significantly altered and agitated.   Vincent Peters is a 38 yo with a PMX of cocaine and alcohol abuse who was brought in via EMS after bring found down on the side of the road with seizure like activity, per EMS incontinent of bladder at seen. No known history of seizures. On admission patient reported some abdominal pain and weakness. Reported last drink was 2 days prior to admission. Per chart review patient has suffered with substance abuse for many years. Per last ED visit, 02/26/2018, patient reported drinking 12 cans of beer a day and has been drinking this amount for 16 plus years.   Patient with witnessed seizure in ED with significant agitation and tremors. PRN ativan administered with little to no relief seen therefore precedex drip was initiated. Vitals signs significant for tachycardia and hypertension. Lab work with slightly decreased, elevated LFTs and mild leukocytosis.   Past Medical History  Alcohol abuse Cocaine abuse  Significant Hospital Events     Consults:  PCCM   Procedures:    Significant Diagnostic Tests:  12/2: [place] >> 12/2: ETOH Level < 10  Micro Data:  12/2 COVID test >>  Antimicrobials:    Interim history/subjective:  Intermittent confusion followed by lethargy with continued withdrawal, no further seizure episodes reported.   Objective   Blood pressure (!) 167/92, pulse 73, temperature (!) 97.1 F (36.2 C), temperature  source Axillary, resp. rate 10, height 5\' 9"  (1.753 m), weight 65.2 kg, SpO2 98 %.        Intake/Output Summary (Last 24 hours) at 07/07/2019 1053 Last data filed at 07/07/2019 1000 Gross per 24 hour  Intake 2739.76 ml  Output 625 ml  Net 2114.76 ml   Filed Weights   07/06/19 1400 07/06/19 1700 07/07/19 0500  Weight: 61.7 kg 65.2 kg 65.2 kg    Examination: General: Audlt male lying in bed, in NAD HEENT: [place]/AT, MM pink/moist, PERRL,  Neuro: Remains intermittently agitated with improvement after administration of benzos    CV: s1s2 regular rate and rhythm, no murmur, rubs, or gallops,  PULM:  Clear to ascultation bilaterally, no increased work of breathing GI: soft, bowel sounds active in all 4 quadrants, non-tender, non-distended Extremities: warm/dry, no edema  Skin: no rashes or lesions  Resolved Hospital Problem list   Leukocytosis  Acidosis   Assessment & Plan:  Seizure in setting of ETOH withdrawal  -Long history of ETOH abuse with 12 plus beers per day for 16 plus years, seizures in Sept of this year - ETOH level < 10, reported last ETOH consumption 48 hours prior to presentation P: Continue CIWA protocol Thiamine, folate, and multivitamin supplementation Head CT negative  ETOH withdrawal protocol with Benzo and Precedex   Wean precedex as able  May require initiation of Librium for transition off precedex   Hypertension P: Goal SBP< 170 Continue PRN hydralazine   Elevated LFTs Thrombocytopenia -Plt count 187 in Sept. 2020, Hepatitis panel negative Sept 2020 -Abdominal ultrasound with normal gallbladder and  signs of hepatic steatosis  -Likely secondary to chronic heavy ETOH abuse  P: INR WNL. Trend [place] CBC  Monitor for signs of bleeding   Best practice:  Diet: NPO Pain/Anxiety/Delirium protocol (if indicated): Benzo + Precedex VAP protocol (if indicated): NA DVT prophylaxis: Hold subq Heparin with plt count 56 GI prophylaxis: Pepcid Glucose control:  Glucose < 180, no SSI Mobility: As tolerated Code Status: FULL Family Communication: no family at bedside  Disposition: Admit to ICU with expected length of stay > 2 nights for treatment and evaluation of seizure like activity secondary to ETOH w/d.   Labs   CBC: Recent Labs  Lab 07/06/19 1006 07/06/19 1701 07/07/19 0358  WBC 16.0* 12.6* 8.2  HGB 14.3 13.6 13.0  HCT 43.1 39.3 38.6*  MCV 96.9 94.9 95.8  PLT 97* 56* 50*    Basic Metabolic Panel: Recent Labs  Lab 07/06/19 1006 07/06/19 1701 07/06/19 1932 07/07/19 0358  NA 143  --  141 142  K 3.9  --  3.9 3.4*  CL 99  --  105 106  CO2 20*  --  21* 23  GLUCOSE 132*  --  114* 105*  BUN 11  --  16 16  CREATININE 0.71 0.78 0.74 0.54*  CALCIUM 9.3  --  8.9 9.0  MG  --  1.7  --  1.8  PHOS  --  3.0  --  3.1   GFR: Estimated Creatinine Clearance: 116.6 mL/min (A) (by C-G formula based on SCr of 0.54 mg/dL (L)). Recent Labs  Lab 07/06/19 1006 07/06/19 1701 07/06/19 1932 07/06/19 2321 07/07/19 0358  WBC 16.0* 12.6*  --   --  8.2  LATICACIDVEN  --   --  1.0 0.9  --     Liver Function Tests: Recent Labs  Lab 07/06/19 1300  AST 90*  ALT 61*  ALKPHOS 89  BILITOT 1.7*  PROT 8.1  ALBUMIN 3.4*   Recent Labs  Lab 07/06/19 1300  LIPASE 29   No results for input(s): AMMONIA in the last 168 hours.  ABG No results found for: PHART, PCO2ART, PO2ART, HCO3, TCO2, ACIDBASEDEF, O2SAT   Coagulation Profile: Recent Labs  Lab 07/06/19 1932  INR 1.1    Cardiac Enzymes: No results for input(s): CKTOTAL, CKMB, CKMBINDEX, TROPONINI in the last 168 hours.  HbA1C: Hgb A1c MFr Bld  Date/Time Value Ref Range Status  04/28/2019 02:01 AM 5.4 4.8 - 5.6 % Final    Comment:    (NOTE)         Prediabetes: 5.7 - 6.4         Diabetes: >6.4         Glycemic control for adults with diabetes: <7.0     CBG: No results for input(s): GLUCAP in the last 168 hours.   Critical care time:    CRITICAL CARE Performed by:  Delfin GantWhitney F Lucette Kratz   Total critical care time: 33 minutes  Critical care time was exclusive of separately billable procedures and treating other patients.  Critical care was necessary to treat or prevent imminent or life-threatening deterioration.  Critical care was time spent personally by me on the following activities: development of treatment plan with patient and/or surrogate as well as nursing, discussions with consultants, evaluation of patient's response to treatment, examination of patient, obtaining history from patient or surrogate, ordering and performing treatments and interventions, ordering and review of laboratory studies, ordering and review of radiographic studies, pulse oximetry and re-evaluation of patient's condition.  Delfin Gant, NP-C [facility] After hours pager: [phone]. 07/07/2019, 11:05 AM

## 2019-07-08 LAB — BASIC METABOLIC PANEL
Anion gap: 11 (ref 5–15)
BUN: 10 mg/dL (ref 6–20)
CO2: 23 mmol/L (ref 22–32)
Calcium: 8.9 mg/dL (ref 8.9–10.3)
Chloride: 102 mmol/L (ref 98–111)
Creatinine, Ser: 0.37 mg/dL — ABNORMAL LOW (ref 0.61–1.24)
GFR calc Af Amer: >60 mL/min (ref >60)
GFR calc non Af Amer: >60 mL/min (ref >60)
Glucose, Bld: 99 mg/dL (ref 70–99)
Potassium: 3.1 mmol/L — ABNORMAL LOW (ref 3.5–5.1)
Sodium: 136 mmol/L (ref 135–145)

## 2019-07-08 LAB — CBC
HCT: 38.6 % — ABNORMAL LOW (ref 39.0–52.0)
Hemoglobin: 13.4 g/dL (ref 13.0–17.0)
MCH: 32.9 pg (ref 26.0–34.0)
MCHC: 34.7 g/dL (ref 30.0–36.0)
MCV: 94.8 fL (ref 80.0–100.0)
Platelets: 51 10*3/uL — ABNORMAL LOW (ref 150–400)
RBC: 4.07 MIL/uL — ABNORMAL LOW (ref 4.22–5.81)
RDW: 12.6 % (ref 11.5–15.5)
WBC: 7.5 10*3/uL (ref 4.0–10.5)
nRBC: 0 % (ref 0.0–0.2)

## 2019-07-08 MED ORDER — POTASSIUM CHLORIDE CRYS ER 20 MEQ PO TBCR
40.0000 meq | EXTENDED_RELEASE_TABLET | ORAL | Status: AC
  Administered 2019-07-08 (×2): 40 meq via ORAL
  Filled 2019-07-08 (×2): qty 2

## 2019-07-08 MED ORDER — DEXMEDETOMIDINE HCL IN NACL 400 MCG/100ML IV SOLN
0.2000 ug/kg/h | INTRAVENOUS | Status: DC
  Administered 2019-07-08: 0.9 ug/kg/h via INTRAVENOUS
  Administered 2019-07-08: 1 ug/kg/h via INTRAVENOUS
  Administered 2019-07-09 (×2): 1.2 ug/kg/h via INTRAVENOUS
  Filled 2019-07-08 (×4): qty 100

## 2019-07-08 MED ORDER — LORAZEPAM 1 MG PO TABS
2.0000 mg | ORAL_TABLET | Freq: Four times a day (QID) | ORAL | Status: DC
  Administered 2019-07-08 – 2019-07-09 (×5): 2 mg via ORAL
  Filled 2019-07-08 (×5): qty 2

## 2019-07-08 NOTE — H&P (Signed)
NAME:  Vincent Peters, MRN:  [id number], DOB:  June 02, 1981, LOS: 2 ADMISSION DATE:  07/06/2019, CONSULTATION DATE:  12/2 REFERRING MD:  Gerhard MunchLockwood, Robert, CHIEF COMPLAINT:  Seizure related to ETOH withdrawal    Brief History   38yo presented to ED after being found down with what appeared to be seizure like activity. PCCM consulted for admission given need for precedex drip for control of alcohol withdrawal.   History of present illness   History obtain per chart review as patient is currently significantly altered and agitated.   [name] is a 38 yo with a PMX of cocaine and alcohol abuse who was brought in via EMS after bring found down on the side of the road with seizure like activity, per EMS incontinent of bladder at seen. No known history of seizures. On admission patient reported some abdominal pain and weakness. Reported last drink was 2 days prior to admission. Per chart review patient has suffered with substance abuse for many years. Per last ED visit, 02/26/2018, patient reported drinking 12 cans of beer a day and has been drinking this amount for 16 plus years.   Patient with witnessed seizure in ED with significant agitation and tremors. PRN ativan administered with little to no relief seen therefore precedex drip was initiated. Vitals signs significant for tachycardia and hypertension. Lab work with slightly decreased, elevated LFTs and mild leukocytosis.   Past Medical History  Alcohol abuse Cocaine abuse  Significant Hospital Events     Consults:  PCCM   Procedures:    Significant Diagnostic Tests:  12/2: CTH >> 12/2: ETOH Level < 10  Micro Data:  12/2 COVID test >>  Antimicrobials:    Interim history/subjective:  Intermittent confusion followed by lethargy with continued withdrawal, no further seizure episodes reported.   Objective   Blood pressure 123/81, pulse 79, temperature 97.6 F (36.4 C), temperature source Oral,  resp. rate 16, height 5\' 9"  (1.753 m), weight 64.8 kg, SpO2 98 %.        Intake/Output Summary (Last 24 hours) at 07/08/2019 0926 Last data filed at 07/08/2019 0700 Gross per 24 hour  Intake 2068.13 ml  Output 1585 ml  Net 483.13 ml   Filed Weights   07/06/19 1700 07/07/19 0500 07/08/19 0500  Weight: 65.2 kg 65.2 kg 64.8 kg    Examination: General: Adult male lying in bed, in NAD HEENT: [place]/AT, MM pink/moist, PERRL,  Neuro: Remains very confused with agitation, unable to follow commands CV: s1s2 regular rate and rhythm, no murmur, rubs, or gallops,  PULM:  Clear bilaterally, no increased work of breathing  GI: soft, bowel sounds active in all 4 quadrants, non-tender, non-distended Extremities: warm/dry, no edema  Skin: no rashes or lesions  Resolved Hospital Problem list   Leukocytosis  Acidosis   Assessment & Plan:  Seizure in setting of ETOH withdrawal  -Long history of ETOH abuse with 12 plus beers per day for 16 plus years, seizures in Sept of this year - ETOH level < 10, reported last ETOH consumption 48 hours prior to presentation P: Continue CIWA protocol Continue Thiamine, folate, and multivitamin  Wean precedex drip off today  Low dose scheduled ativan started today, wean daily as able   Hypertension P: Goal SBP> 170 Continue PRN hydralazine   Elevated LFTs Thrombocytopenia -Plt count 187 in Sept. 2020, Hepatitis panel negative Sept 2020 -Abdominal ultrasound with normal gallbladder and signs of hepatic steatosis  -Likely secondary to chronic heavy ETOH abuse  P: Trend CBC  Monitor for signs for bleeding  INR WNL  Best practice:  Diet: NPO Pain/Anxiety/Delirium protocol (if indicated): Benzo + Precedex VAP protocol (if indicated): NA DVT prophylaxis: Hold subq Heparin with plt count 56 GI prophylaxis: Pepcid Glucose control: Glucose < 180, no SSI Mobility: As tolerated Code Status: FULL Family Communication: no family at bedside  Disposition:  Admit to ICU with expected length of stay > 2 nights for treatment and evaluation of seizure like activity secondary to ETOH w/d.   Labs   CBC: Recent Labs  Lab 07/06/19 1006 07/06/19 1701 07/07/19 0358 07/08/19 0412  WBC 16.0* 12.6* 8.2 7.5  HGB 14.3 13.6 13.0 13.4  HCT 43.1 39.3 38.6* 38.6*  MCV 96.9 94.9 95.8 94.8  PLT 97* 56* 50* 51*    Basic Metabolic Panel: Recent Labs  Lab 07/06/19 1006 07/06/19 1701 07/06/19 1932 07/07/19 0358 07/08/19 0412  NA 143  --  141 142 136  K 3.9  --  3.9 3.4* 3.1*  CL 99  --  105 106 102  CO2 20*  --  21* 23 23  GLUCOSE 132*  --  114* 105* 99  BUN 11  --  16 16 10   CREATININE 0.71 0.78 0.74 0.54* 0.37*  CALCIUM 9.3  --  8.9 9.0 8.9  MG  --  1.7  --  1.8  --   PHOS  --  3.0  --  3.1  --    GFR: Estimated Creatinine Clearance: 115.9 mL/min (A) (by C-G formula based on SCr of 0.37 mg/dL (L)). Recent Labs  Lab 07/06/19 1006 07/06/19 1701 07/06/19 1932 07/06/19 2321 07/07/19 0358 07/08/19 0412  WBC 16.0* 12.6*  --   --  8.2 7.5  LATICACIDVEN  --   --  1.0 0.9  --   --     Liver Function Tests: Recent Labs  Lab 07/06/19 1300  AST 90*  ALT 61*  ALKPHOS 89  BILITOT 1.7*  PROT 8.1  ALBUMIN 3.4*   Recent Labs  Lab 07/06/19 1300  LIPASE 29   No results for input(s): AMMONIA in the last 168 hours.  ABG No results found for: PHART, PCO2ART, PO2ART, HCO3, TCO2, ACIDBASEDEF, O2SAT   Coagulation Profile: Recent Labs  Lab 07/06/19 1932  INR 1.1    Cardiac Enzymes: No results for input(s): CKTOTAL, CKMB, CKMBINDEX, TROPONINI in the last 168 hours.  HbA1C: Hgb A1c MFr Bld  Date/Time Value Ref Range Status  04/28/2019 02:01 AM 5.4 4.8 - 5.6 % Final    Comment:    (NOTE)         Prediabetes: 5.7 - 6.4         Diabetes: >6.4         Glycemic control for adults with diabetes: <7.0     CBG: No results for input(s): GLUCAP in the last 168 hours.   Critical care time:    CRITICAL CARE Performed by: Johnsie Cancel   Total critical care time: 36 minutes  Critical care time was exclusive of separately billable procedures and treating other patients.  Critical care was necessary to treat or prevent imminent or life-threatening deterioration.  Critical care was time spent personally by me on the following activities: development of treatment plan with patient and/or surrogate as well as nursing, discussions with consultants, evaluation of patient's response to treatment, examination of patient, obtaining history from patient or surrogate, ordering and performing treatments and interventions, ordering and review of laboratory studies, ordering and review of radiographic  studies, pulse oximetry and re-evaluation of patient's condition.    Delfin Gant, NP-C [facility] After hours pager: [phone]. 07/08/2019, 9:26 AM

## 2019-07-09 MED ORDER — LORAZEPAM 1 MG PO TABS
1.0000 mg | ORAL_TABLET | ORAL | Status: DC | PRN
  Administered 2019-07-10 (×2): 4 mg via ORAL
  Filled 2019-07-09 (×2): qty 4

## 2019-07-09 MED ORDER — LORAZEPAM 2 MG/ML IJ SOLN
1.0000 mg | INTRAMUSCULAR | Status: DC | PRN
  Administered 2019-07-09 – 2019-07-10 (×4): 4 mg via INTRAVENOUS
  Administered 2019-07-11: 2 mg via INTRAVENOUS
  Administered 2019-07-11: 1 mg via INTRAVENOUS
  Administered 2019-07-11: 2 mg via INTRAVENOUS
  Administered 2019-07-11: 4 mg via INTRAVENOUS
  Filled 2019-07-09 (×3): qty 1
  Filled 2019-07-09 (×4): qty 2

## 2019-07-09 MED ORDER — PHENOBARBITAL SODIUM 130 MG/ML IJ SOLN: 100.0000 mg | Freq: Once | INTRAMUSCULAR | Status: DC

## 2019-07-09 MED ORDER — PHENOBARBITAL SODIUM 130 MG/ML IJ SOLN
65.0000 mg | Freq: Once | INTRAMUSCULAR | Status: AC
  Administered 2019-07-09: 65 mg via INTRAVENOUS
  Filled 2019-07-09: qty 1

## 2019-07-09 NOTE — H&P (Addendum)
NAME:  Vincent Peters, MRN:  [id number], DOB:  10-14-1980, LOS: 3 ADMISSION DATE:  07/06/2019, CONSULTATION DATE:  12/2 REFERRING MD:  Carmin Muskrat, CHIEF COMPLAINT:  Seizure related to ETOH withdrawal    Brief History   38yo presented to ED after being found down with what appeared to be seizure like activity. PCCM consulted for admission given need for precedex drip for control of alcohol withdrawal.   History of present illness   History obtain per chart review as patient is currently significantly altered and agitated.   [name] is a 38 yo with a PMX of cocaine and alcohol abuse who was brought in via EMS after bring found down on the side of the road with seizure like activity, per EMS incontinent of bladder at seen. No known history of seizures. On admission patient reported some abdominal pain and weakness. Reported last drink was 2 days prior to admission. Per chart review patient has suffered with substance abuse for many years. Per last ED visit, 02/26/2018, patient reported drinking 12 cans of beer a day and has been drinking this amount for 16 plus years.   Patient with witnessed seizure in ED with significant agitation and tremors. PRN ativan administered with little to no relief seen therefore precedex drip was initiated. Vitals signs significant for tachycardia and hypertension. Lab work with slightly decreased, elevated LFTs and mild leukocytosis.   Past Medical History  Alcohol abuse Cocaine abuse  Significant Hospital Events   Initiated on Precedex for [facility] withdrawal   Consults:  PCCM   Procedures:  none  Significant Diagnostic Tests:  12/2: CTH >> 12/2: ETOH Level < 10  Micro Data:  12/2 COVID test >>  Antimicrobials:  none  Interim history/subjective:  Unable to wean precedex. Remains confused.   Objective   Blood pressure 111/64, pulse 75, temperature (!) 97 F (36.1 C), temperature source Axillary, resp.  rate 18, height 5\' 9"  (1.753 m), weight 64.8 kg, SpO2 99 %.        Intake/Output Summary (Last 24 hours) at 07/09/2019 0916 Last data filed at 07/09/2019 0900 Gross per 24 hour  Intake 506.23 ml  Output 2600 ml  Net -2093.77 ml   Filed Weights   07/06/19 1700 07/07/19 0500 07/08/19 0500  Weight: 65.2 kg 65.2 kg 64.8 kg    Examination: General: Adult male lying in bed, in NAD HEENT: [place]/AT, MM pink/moist, PERRL,  Neuro: Remains very confused with agitation, unable to follow commands CV: s1s2 regular rate and rhythm, no murmur, rubs, or gallops,  PULM:  Clear bilaterally, no increased work of breathing  GI: soft, bowel sounds active in all 4 quadrants, non-tender, non-distended Extremities: warm/dry, no edema  Skin: no rashes or lesions  Resolved Hospital Problem list   Leukocytosis  Acidosis   Assessment & Plan:   Remains critically ill due to severe alcohol withdrawal. Seizure in setting of ETOH withdrawal  Hypertension when off Precedex Mild alcoholic hepatitis Thrombocytopenia related to alcohol  Still showing signs of withdrawal when sedation weaned but otherwise able to follow commands.  Will administer phenobarbital load for slow taper off dexmedetomidine. Best practice:  Diet: Clear liquid - progress as mental status allows. Pain/Anxiety/Delirium protocol (if indicated):  Precedex and phenobarbital VAP protocol (if indicated): NA DVT prophylaxis: Hold subq Heparin with plt count 56 GI prophylaxis: Pepcid Glucose control: Glucose < 180, no SSI Mobility: As tolerated Code Status: FULL Family Communication: no family at bedside  Disposition: Admit to ICU with expected length  of stay > 2 nights for treatment and evaluation of seizure like activity secondary to ETOH w/d.   Labs   CBC: Recent Labs  Lab 07/06/19 1006 07/06/19 1701 07/07/19 0358 07/08/19 0412  WBC 16.0* 12.6* 8.2 7.5  HGB 14.3 13.6 13.0 13.4  HCT 43.1 39.3 38.6* 38.6*  MCV 96.9 94.9 95.8 94.8   PLT 97* 56* 50* 51*    Basic Metabolic Panel: Recent Labs  Lab 07/06/19 1006 07/06/19 1701 07/06/19 1932 07/07/19 0358 07/08/19 0412  NA 143  --  141 142 136  K 3.9  --  3.9 3.4* 3.1*  CL 99  --  105 106 102  CO2 20*  --  21* 23 23  GLUCOSE 132*  --  114* 105* 99  BUN 11  --  16 16 10   CREATININE 0.71 0.78 0.74 0.54* 0.37*  CALCIUM 9.3  --  8.9 9.0 8.9  MG  --  1.7  --  1.8  --   PHOS  --  3.0  --  3.1  --    GFR: Estimated Creatinine Clearance: 115.9 mL/min (A) (by C-G formula based on SCr of 0.37 mg/dL (L)). Recent Labs  Lab 07/06/19 1006 07/06/19 1701 07/06/19 1932 07/06/19 2321 07/07/19 0358 07/08/19 0412  WBC 16.0* 12.6*  --   --  8.2 7.5  LATICACIDVEN  --   --  1.0 0.9  --   --     Liver Function Tests: Recent Labs  Lab 07/06/19 1300  AST 90*  ALT 61*  ALKPHOS 89  BILITOT 1.7*  PROT 8.1  ALBUMIN 3.4*   Recent Labs  Lab 07/06/19 1300  LIPASE 29   No results for input(s): AMMONIA in the last 168 hours.  ABG No results found for: PHART, PCO2ART, PO2ART, HCO3, TCO2, ACIDBASEDEF, O2SAT   Coagulation Profile: Recent Labs  Lab 07/06/19 1932  INR 1.1    Cardiac Enzymes: No results for input(s): CKTOTAL, CKMB, CKMBINDEX, TROPONINI in the last 168 hours.  HbA1C: Hgb A1c MFr Bld  Date/Time Value Ref Range Status  04/28/2019 02:01 AM 5.4 4.8 - 5.6 % Final    Comment:    (NOTE)         Prediabetes: 5.7 - 6.4         Diabetes: >6.4         Glycemic control for adults with diabetes: <7.0     CBG: No results for input(s): GLUCAP in the last 168 hours.    04/30/2019, MD [facility] ICU Physician [facility]  Pager: [phone] Mobile: [phone] After hours: [phone].  07/09/2019, 9:23 AM

## 2019-07-10 LAB — CBC WITH DIFFERENTIAL/PLATELET
Abs Immature Granulocytes: 0.05 10*3/uL (ref 0.00–0.07)
Basophils Absolute: 0.1 10*3/uL (ref 0.0–0.1)
Basophils Relative: 1 %
Eosinophils Absolute: 0.1 10*3/uL (ref 0.0–0.5)
Eosinophils Relative: 1 %
HCT: 39 % (ref 39.0–52.0)
Hemoglobin: 13.6 g/dL (ref 13.0–17.0)
Immature Granulocytes: 1 %
Lymphocytes Relative: 15 %
Lymphs Abs: 1.3 10*3/uL (ref 0.7–4.0)
MCH: 32.7 pg (ref 26.0–34.0)
MCHC: 34.9 g/dL (ref 30.0–36.0)
MCV: 93.8 fL (ref 80.0–100.0)
Monocytes Absolute: 1.6 10*3/uL — ABNORMAL HIGH (ref 0.1–1.0)
Monocytes Relative: 18 %
Neutro Abs: 5.6 10*3/uL (ref 1.7–7.7)
Neutrophils Relative %: 64 %
Platelets: 81 10*3/uL — ABNORMAL LOW (ref 150–400)
RBC: 4.16 MIL/uL — ABNORMAL LOW (ref 4.22–5.81)
RDW: 12.7 % (ref 11.5–15.5)
WBC: 8.6 10*3/uL (ref 4.0–10.5)
nRBC: 0 % (ref 0.0–0.2)

## 2019-07-10 LAB — COMPREHENSIVE METABOLIC PANEL WITH GFR
ALT: 158 U/L — ABNORMAL HIGH (ref 0–44)
AST: 178 U/L — ABNORMAL HIGH (ref 15–41)
Albumin: 2.9 g/dL — ABNORMAL LOW (ref 3.5–5.0)
Alkaline Phosphatase: 99 U/L (ref 38–126)
Anion gap: 11 (ref 5–15)
BUN: <5 mg/dL — ABNORMAL LOW (ref 6–20)
CO2: 23 mmol/L (ref 22–32)
Calcium: 8.8 mg/dL — ABNORMAL LOW (ref 8.9–10.3)
Chloride: 104 mmol/L (ref 98–111)
Creatinine, Ser: 0.49 mg/dL — ABNORMAL LOW (ref 0.61–1.24)
GFR calc Af Amer: >60 mL/min
GFR calc non Af Amer: >60 mL/min
Glucose, Bld: 116 mg/dL — ABNORMAL HIGH (ref 70–99)
Potassium: 2.9 mmol/L — ABNORMAL LOW (ref 3.5–5.1)
Sodium: 138 mmol/L (ref 135–145)
Total Bilirubin: 1 mg/dL (ref 0.3–1.2)
Total Protein: 6.9 g/dL (ref 6.5–8.1)

## 2019-07-10 MED ORDER — POTASSIUM CHLORIDE 20 MEQ PO PACK
40.0000 meq | PACK | Freq: Two times a day (BID) | ORAL | Status: AC
  Administered 2019-07-10 (×2): 40 meq via ORAL
  Filled 2019-07-10 (×2): qty 2

## 2019-07-10 MED ORDER — DEXMEDETOMIDINE HCL IN NACL 400 MCG/100ML IV SOLN
0.4000 ug/kg/h | INTRAVENOUS | Status: DC
  Administered 2019-07-10 (×2): 1.2 ug/kg/h via INTRAVENOUS
  Administered 2019-07-11: 1 ug/kg/h via INTRAVENOUS
  Administered 2019-07-11 – 2019-07-12 (×3): 1.2 ug/kg/h via INTRAVENOUS
  Administered 2019-07-12: 0.7 ug/kg/h via INTRAVENOUS
  Administered 2019-07-12: 1.2 ug/kg/h via INTRAVENOUS
  Filled 2019-07-10 (×5): qty 100
  Filled 2019-07-10: qty 200
  Filled 2019-07-10: qty 100

## 2019-07-10 MED ORDER — PHENOBARBITAL SODIUM 65 MG/ML IJ SOLN
65.0000 mg | Freq: Once | INTRAMUSCULAR | Status: AC
  Administered 2019-07-10: 65 mg via INTRAVENOUS
  Filled 2019-07-10: qty 1

## 2019-07-10 MED ORDER — PHENOBARBITAL SODIUM 130 MG/ML IJ SOLN
65.0000 mg | Freq: Once | INTRAMUSCULAR | Status: AC
  Administered 2019-07-10: 65 mg via INTRAVENOUS
  Filled 2019-07-10 (×2): qty 1

## 2019-07-10 MED ORDER — HALOPERIDOL LACTATE 5 MG/ML IJ SOLN
5.0000 mg | Freq: Four times a day (QID) | INTRAMUSCULAR | Status: DC | PRN
  Administered 2019-07-11: 5 mg via INTRAVENOUS
  Filled 2019-07-10: qty 1

## 2019-07-10 NOTE — Progress Notes (Signed)
Pt is extremely agitated, attempting to remove posey, mittens and bilateral wrist restraints and is not able to be re-directed by nursing staff. Pt is kicking and swinging at nursing staff, security has been called to the pt room. MD was notified of pt's agitation and aggression towards staff, verbal orders received for a one time dose of 65mg  of phenobarbital, to re-start the precedex gtt and use restraints for ankles and wrists.

## 2019-07-11 LAB — BASIC METABOLIC PANEL
Anion gap: 9 (ref 5–15)
BUN: 6 mg/dL (ref 6–20)
CO2: 22 mmol/L (ref 22–32)
Calcium: 9.2 mg/dL (ref 8.9–10.3)
Chloride: 106 mmol/L (ref 98–111)
Creatinine, Ser: 0.46 mg/dL — ABNORMAL LOW (ref 0.61–1.24)
GFR calc Af Amer: >60 mL/min (ref >60)
GFR calc non Af Amer: >60 mL/min (ref >60)
Glucose, Bld: 141 mg/dL — ABNORMAL HIGH (ref 70–99)
Potassium: 3.1 mmol/L — ABNORMAL LOW (ref 3.5–5.1)
Sodium: 137 mmol/L (ref 135–145)

## 2019-07-11 MED ORDER — POTASSIUM CHLORIDE CRYS ER 20 MEQ PO TBCR
40.0000 meq | EXTENDED_RELEASE_TABLET | ORAL | Status: AC
  Administered 2019-07-11: 40 meq via ORAL
  Filled 2019-07-11: qty 2

## 2019-07-11 MED ORDER — FOLIC ACID 5 MG/ML IJ SOLN
1.0000 mg | Freq: Every day | INTRAMUSCULAR | Status: DC
  Administered 2019-07-11 – 2019-07-12 (×2): 1 mg via INTRAVENOUS
  Filled 2019-07-11 (×3): qty 0.2

## 2019-07-11 MED ORDER — HALOPERIDOL LACTATE 5 MG/ML IJ SOLN
2.0000 mg | Freq: Once | INTRAMUSCULAR | Status: AC
  Administered 2019-07-11: 2 mg via INTRAVENOUS
  Filled 2019-07-11: qty 1

## 2019-07-11 MED ORDER — PANTOPRAZOLE SODIUM 40 MG IV SOLR
40.0000 mg | INTRAVENOUS | Status: DC
  Administered 2019-07-11 – 2019-07-12 (×2): 40 mg via INTRAVENOUS
  Filled 2019-07-11 (×2): qty 40

## 2019-07-11 MED ORDER — FAMOTIDINE 20 MG PO TABS
20.0000 mg | ORAL_TABLET | Freq: Two times a day (BID) | ORAL | Status: DC
  Filled 2019-07-11 (×2): qty 1

## 2019-07-11 MED ORDER — LORAZEPAM 2 MG/ML IJ SOLN
2.0000 mg | INTRAMUSCULAR | Status: DC | PRN
  Administered 2019-07-11 – 2019-07-12 (×6): 2 mg via INTRAVENOUS
  Filled 2019-07-11 (×7): qty 1

## 2019-07-11 MED ORDER — LORAZEPAM 1 MG PO TABS: 2.0000 mg | ORAL_TABLET | ORAL | Status: DC | PRN

## 2019-07-11 MED ORDER — MAGNESIUM SULFATE IN D5W 1-5 GM/100ML-% IV SOLN
1.0000 g | Freq: Once | INTRAVENOUS | Status: AC
  Administered 2019-07-11: 1 g via INTRAVENOUS
  Filled 2019-07-11: qty 100

## 2019-07-11 NOTE — TOC Initial Note (Signed)
Transition of Care [facility]) - Initial/Assessment Note    Patient Details  Name: Vincent Peters MRN: [id number] Date of Birth: 12/07/1980  Transition of Care [facility] CM/SW Contact:    Carles Collet, RN Phone Number: 07/11/2019, 9:54 AM  Clinical Narrative:          36TD presented to ED after being found down on side of road with what appeared to be seizure like activity. Admission given need for precedex drip for control of alcohol withdrawal.     Patient currently remains disoriented and not appropriate for interview to assess and discuss resources, potential plans for disposition, or ability to provide self care. TOC will continue to follow.        Expected Discharge Plan: (TBD)     Patient Goals and CMS Choice        Expected Discharge Plan and Services Expected Discharge Plan: (TBD)                                              Prior Living Arrangements/Services                       Activities of Daily Living      Permission Sought/Granted                  Emotional Assessment              Admission diagnosis:  Alcohol withdrawal syndrome, with delirium ([place]) [F10.231] Patient Active Problem List   Diagnosis Date Noted  . Withdrawal symptoms, alcohol ([place]) 07/06/2019  . Alcohol abuse with alcohol-induced mood disorder ([place]) 02/26/2018  . Alcohol withdrawal ([place]) 02/10/2014  . Hypokalemia 02/10/2014  . Thrombocytopenia ([place]) 02/10/2014  . Hypercalcemia 02/10/2014  . Delirium tremens ([place]) 02/10/2014   PCP:  Patient, No Pcp Per Pharmacy:   [facility] [address] [place] [address] Phone: [phone] Fax: [phone]     Social Determinants of Health (SDOH) Interventions    Readmission Risk Interventions No flowsheet data found.

## 2019-07-11 NOTE — Progress Notes (Signed)
NAMEHiep Peters, MRN:  [id number], DOB:  [birth year], LOS: 5 ADMISSION DATE:  07/06/2019, CONSULTATION DATE:  12/2 REFERRING MD:  Carmin Muskrat, CHIEF COMPLAINT:  Seizure related to ETOH withdrawal    Brief History   38yo presented to ED after being found down with what appeared to be seizure like activity. PCCM consulted for admission given need for precedex drip for control of alcohol withdrawal.   History of present illness   History obtain per chart review as patient is currently significantly altered and agitated.   Vincent Peters is a 38 yo with a PMX of cocaine and alcohol abuse who was brought in via EMS after bring found down on the side of the road with seizure like activity, per EMS incontinent of bladder at seen. No known history of seizures. On admission patient reported some abdominal pain and weakness. Reported last drink was 2 days prior to admission. Per chart review patient has suffered with substance abuse for many years. Per last ED visit, 02/26/2018, patient reported drinking 12 cans of beer a day and has been drinking this amount for 16 plus years.   Patient with witnessed seizure in ED with significant agitation and tremors. PRN ativan administered with little to no relief seen therefore precedex drip was initiated. Vitals signs significant for tachycardia and hypertension. Lab work with slightly decreased, elevated LFTs and mild leukocytosis.   Past Medical History  Alcohol abuse Cocaine abuse  Significant Hospital Events   Initiated on Precedex for [facility] withdrawal   Consults:  PCCM   Procedures:  none  Significant Diagnostic Tests:  12/2: CTH >> 12/2: ETOH Level < 10  Micro Data:  12/2 COVID test >>  Antimicrobials:  none  Interim history/subjective:  Weaning precedex today HDS   Objective   Blood pressure 132/84, pulse 63, temperature 97.7 F (36.5 C), temperature source Axillary, resp. rate 11, height 5'  9" (1.753 m), weight 63.1 kg, SpO2 97 %.        Intake/Output Summary (Last 24 hours) at 07/11/2019 0900 Last data filed at 07/11/2019 0800 Gross per 24 hour  Intake 781.21 ml  Output 1900 ml  Net -1118.79 ml   Filed Weights   07/07/19 0500 07/08/19 0500 07/11/19 0500  Weight: 65.2 kg 64.8 kg 63.1 kg    Examination: General: Adult male, reclined in bed, drowsy and NAD  HEENT: NCAT. Pink mmm. Trachea midline. Patent nares  Neuro: Examined on 1.0 precedex. Awakens spontaneously and to stimulation. Disoriented. Intermittently following commands  CV: RRR s1s2 no rgm. Cap refill < 3 seconds  PULM:  CTA. Even, unlabored on RA.  GI: soft round ndnt. + bowel sounds x 4 Extremities: BUE soft restraints. Symmetrical bulk and tone. No cyanosis or clubbing  Skin: c/d/w/i without rash   Resolved Hospital Problem list   Leukocytosis  Acidosis   Assessment & Plan:   EtOH withdrawal Seizure in setting of EtOH withdrwawal s/p phenobarbital load for slow taper off dexmedetomidine P -Wean precedex as able -Continue CIWA (Ativan) -Seizure precautions -Continue B vitamins  Hypertension -continue tele -consider Ativan before antihypertensives   Mild alcoholic hepatitis -LFTs PRN -If poor mentation despite weaning precedex, check ammonia   Thrombocytopenia related to alcohol P -no chemical vte ppx in setting of thrombocytopenia  -Trend CBC  Hypokalemia -s/p repletion 12/6 Borderline hypomagnesemia (1.8 on 12/3) P -Check BMP 12/7 -AM BMP -give 1 gram mag in setting of hypokalemia and check 12/8 mag   Best practice:  Diet: Regular  Pain/Anxiety/Delirium protocol (if indicated):  Precedex and PRN Ativan per CIWA VAP protocol (if indicated): NA DVT prophylaxis: SCD GI prophylaxis: Pepcid Glucose control: Glucose < 180, no SSI Mobility: As tolerated Code Status: FULL Family Communication: no family at bedside  Disposition Remains in ICU due to precedex infusion for EtOH  withdrawal    Labs   CBC: Recent Labs  Lab 07/06/19 1006 07/06/19 1701 07/07/19 0358 07/08/19 0412 07/10/19 0659  WBC 16.0* 12.6* 8.2 7.5 8.6  NEUTROABS  --   --   --   --  5.6  HGB 14.3 13.6 13.0 13.4 13.6  HCT 43.1 39.3 38.6* 38.6* 39.0  MCV 96.9 94.9 95.8 94.8 93.8  PLT 97* 56* 50* 51* 81*    Basic Metabolic Panel: Recent Labs  Lab 07/06/19 1006 07/06/19 1701 07/06/19 1932 07/07/19 0358 07/08/19 0412 07/10/19 0659  NA 143  --  141 142 136 138  K 3.9  --  3.9 3.4* 3.1* 2.9*  CL 99  --  105 106 102 104  CO2 20*  --  21* 23 23 23   GLUCOSE 132*  --  114* 105* 99 116*  BUN 11  --  16 16 10  <5*  CREATININE 0.71 0.78 0.74 0.54* 0.37* 0.49*  CALCIUM 9.3  --  8.9 9.0 8.9 8.8*  MG  --  1.7  --  1.8  --   --   PHOS  --  3.0  --  3.1  --   --    GFR: Estimated Creatinine Clearance: 112.8 mL/min (A) (by C-G formula based on SCr of 0.49 mg/dL (L)). Recent Labs  Lab 07/06/19 1701 07/06/19 1932 07/06/19 2321 07/07/19 0358 07/08/19 0412 07/10/19 0659  WBC 12.6*  --   --  8.2 7.5 8.6  LATICACIDVEN  --  1.0 0.9  --   --   --     Liver Function Tests: Recent Labs  Lab 07/06/19 1300 07/10/19 0659  AST 90* 178*  ALT 61* 158*  ALKPHOS 89 99  BILITOT 1.7* 1.0  PROT 8.1 6.9  ALBUMIN 3.4* 2.9*   Recent Labs  Lab 07/06/19 1300  LIPASE 29   No results for input(s): AMMONIA in the last 168 hours.  ABG No results found for: PHART, PCO2ART, PO2ART, HCO3, TCO2, ACIDBASEDEF, O2SAT   Coagulation Profile: Recent Labs  Lab 07/06/19 1932  INR 1.1    Cardiac Enzymes: No results for input(s): CKTOTAL, CKMB, CKMBINDEX, TROPONINI in the last 168 hours.  HbA1C: Hgb A1c MFr Bld  Date/Time Value Ref Range Status  04/28/2019 02:01 AM 5.4 4.8 - 5.6 % Final    Comment:    (NOTE)         Prediabetes: 5.7 - 6.4         Diabetes: >6.4         Glycemic control for adults with diabetes: <7.0     CBG: No results for input(s): GLUCAP in the last 168 hours.     CRITICAL CARE Performed by: 14/02/20   Total critical care time: 35 minutes  Critical care time was exclusive of separately billable procedures and treating other patients. Critical care was necessary to treat or prevent imminent or life-threatening deterioration.  Critical care was time spent personally by me on the following activities: development of treatment plan with patient and/or surrogate as well as nursing, discussions with consultants, evaluation of patient's response to treatment, examination of patient, obtaining history from patient or surrogate, ordering and performing treatments and  interventions, ordering and review of laboratory studies, ordering and review of radiographic studies, pulse oximetry and re-evaluation of patient's condition.   Tessie FassGrace Kailyn Dubie MSN, AGACNP-BC [place] [facility]/Critical [facility] Medicine [phone] If no answer, [phone] 07/11/2019, 9:00 AM

## 2019-07-12 DIAGNOSIS — R7989 Other specified abnormal findings of blood chemistry: Secondary | ICD-10-CM

## 2019-07-12 LAB — CBC
HCT: 39.8 % (ref 39.0–52.0)
Hemoglobin: 13.3 g/dL (ref 13.0–17.0)
MCH: 32.5 pg (ref 26.0–34.0)
MCHC: 33.4 g/dL (ref 30.0–36.0)
MCV: 97.3 fL (ref 80.0–100.0)
Platelets: 97 10*3/uL — ABNORMAL LOW (ref 150–400)
RBC: 4.09 MIL/uL — ABNORMAL LOW (ref 4.22–5.81)
RDW: 12.8 % (ref 11.5–15.5)
WBC: 5.8 10*3/uL (ref 4.0–10.5)
nRBC: 0 % (ref 0.0–0.2)

## 2019-07-12 LAB — BASIC METABOLIC PANEL
Anion gap: 10 (ref 5–15)
BUN: <5 mg/dL — ABNORMAL LOW (ref 6–20)
CO2: 24 mmol/L (ref 22–32)
Calcium: 9.2 mg/dL (ref 8.9–10.3)
Chloride: 104 mmol/L (ref 98–111)
Creatinine, Ser: 0.42 mg/dL — ABNORMAL LOW (ref 0.61–1.24)
GFR calc Af Amer: >60 mL/min (ref >60)
GFR calc non Af Amer: >60 mL/min (ref >60)
Glucose, Bld: 106 mg/dL — ABNORMAL HIGH (ref 70–99)
Potassium: 3.8 mmol/L (ref 3.5–5.1)
Sodium: 138 mmol/L (ref 135–145)

## 2019-07-12 LAB — HEPATIC FUNCTION PANEL
ALT: 171 U/L — ABNORMAL HIGH (ref 0–44)
AST: 143 U/L — ABNORMAL HIGH (ref 15–41)
Albumin: 2.8 g/dL — ABNORMAL LOW (ref 3.5–5.0)
Alkaline Phosphatase: 80 U/L (ref 38–126)
Bilirubin, Direct: 0.2 mg/dL (ref 0.0–0.2)
Indirect Bilirubin: 0.3 mg/dL (ref 0.3–0.9)
Total Bilirubin: 0.5 mg/dL (ref 0.3–1.2)
Total Protein: 6.5 g/dL (ref 6.5–8.1)

## 2019-07-12 LAB — MAGNESIUM: Magnesium: 1.5 mg/dL — ABNORMAL LOW (ref 1.7–2.4)

## 2019-07-12 LAB — AMMONIA: Ammonia: 54 umol/L — ABNORMAL HIGH (ref 9–35)

## 2019-07-12 MED ORDER — QUETIAPINE FUMARATE 50 MG PO TABS
25.0000 mg | ORAL_TABLET | Freq: Two times a day (BID) | ORAL | Status: DC
  Administered 2019-07-12 – 2019-07-17 (×10): 25 mg via ORAL
  Filled 2019-07-12 (×10): qty 1

## 2019-07-12 MED ORDER — MAGNESIUM SULFATE 2 GM/50ML IV SOLN
2.0000 g | Freq: Once | INTRAVENOUS | Status: AC
  Administered 2019-07-12: 2 g via INTRAVENOUS
  Filled 2019-07-12: qty 50

## 2019-07-12 MED ORDER — LACTULOSE 10 GM/15ML PO SOLN
20.0000 g | Freq: Two times a day (BID) | ORAL | Status: DC
  Administered 2019-07-12 – 2019-07-17 (×5): 20 g via ORAL
  Filled 2019-07-12 (×8): qty 30

## 2019-07-12 NOTE — Progress Notes (Signed)
Brief Progress NOte  Family Communication  I have called and spoken with Enid Derry the patient's brother in law.His wife is the patient's sister. They have been out of touch with the patient for about a year. They had been called and told that the patient had been admitted to the hospital, but they did not have the number to the hospital. I have updated them on the patient's condition. I have given them the telephone number to the [facility] for updates. They will provide updates to the patient's mother who lives  in [place].   Telephone number I  used to contacted [place],   [phone]  Magdalen Spatz, MSN, AGACNP-BC [place] Pager # [phone] After 4 pm please call [phone]

## 2019-07-12 NOTE — Progress Notes (Signed)
NAME:  Vincent Peters, MRN:  [id number], DOB:  1980-10-23, LOS: 6 ADMISSION DATE:  07/06/2019, CONSULTATION DATE:  12/2 REFERRING MD:  Gerhard MunchLockwood, Robert, CHIEF COMPLAINT:  Seizure related to ETOH withdrawal    Brief History   38yo presented to ED after being found down with what appeared to be seizure like activity. PCCM consulted for admission given need for precedex drip for control of alcohol withdrawal.   History of present illness   History obtain per chart review as patient is currently significantly altered and agitated.   [name] is a 38 yo with a PMX of cocaine and alcohol abuse who was brought in via EMS after bring found down on the side of the road with seizure like activity, per EMS incontinent of bladder at scene. No known history of seizures. On admission patient reported some abdominal pain and weakness. Reported last drink was 2 days prior to admission. Per chart review patient has suffered with substance abuse for many years. Per last ED visit, 02/26/2018, patient reported drinking 12 cans of beer a day and has been drinking this amount for 16 plus years.   Patient with witnessed seizure in ED with significant agitation and tremors. PRN ativan administered with little to no relief seen therefore precedex drip was initiated. Vitals signs significant for tachycardia and hypertension. Lab work with slightly decreased, elevated LFTs and mild leukocytosis.   Past Medical History  Alcohol abuse Cocaine abuse  Significant Hospital Events   [facility]   Consults:  PCCM   Procedures:  none  Significant Diagnostic Tests:  12/2: CTH >> 12/2: ETOH Level < 10  Micro Data:  12/2 COVID test >>Negative  Antimicrobials:  none  Interim history/subjective:  Precedex at 1.2 mcg/kg/hr Weaning made difficult due to increased need for ativan at lower infusion  doses Afebrile, WBC 5.6 No further seizure  activity Net negative 2.8 L  Objective   Blood pressure 128/86, pulse (!) 43, temperature (!) 97.4 F (36.3 C), temperature source Axillary, resp. rate 14, height 5\' 9"  (1.753 m), weight 63.4 kg, SpO2 (!) 81 %.        Intake/Output Summary (Last 24 hours) at 07/12/2019 0758 Last data filed at 07/12/2019 0600 Gross per 24 hour  Intake 783.67 ml  Output 1400 ml  Net -616.33 ml   Filed Weights   07/08/19 0500 07/11/19 0500 07/12/19 0426  Weight: 64.8 kg 63.1 kg 63.4 kg    Examination: General: Adult male, supine  in bed, drowsy. looking at things that are not there, NAD  HEENT: NCAT. Pink , dry mm. Trachea midline. Patent nares, No LAD or JVD  Neuro: Examined on 1.2  precedex. Awakens spontaneously and to stimulation. Disoriented. Intermittently following commands, appears to be hallucinating  CV: RRR s1s2 no rgm. Cap refill < 3 seconds  PULM:  Bilateral chest excursion, Breath sounds are clear, diminished per bases GI: soft round ndnt. + bowel sounds x 4, NT, ND Extremities: BUE soft restraints. Symmetrical bulk and tone. No cyanosis or clubbing  Skin: c/d/w/i without rash or lesions  Resolved Hospital Problem list   Leukocytosis  Acidosis   Assessment & Plan:   EtOH withdrawal Seizure in setting of EtOH withdrwawal s/p phenobarbital load for slow taper off dexmedetomidine No further seizure activity P - Wean precedex as able - Continue CIWA (Ativan)>> needing 2 mg Q 4 on 12/8 as CIWA > 8 - Seizure precautions - Thiamine and folic acid - Consider adding Seroquel/Klonopin  Hypertension -continue tele -consider Ativan before antihypertensives   Mild alcoholic hepatitis Elevated LFT's ( ALT 171 bump, AST 143 slight decrease) - LFTs 12/9 and prn - check ammonia  Thrombocytopenia related to alcohol Up to 91,000 P -no chemical vte ppx in setting of thrombocytopenia  -Trend CBC  Hypokalemia Hypomag>> 1.5 on 12/8 P - Trend  BMP - Trend Mag - Replete  electrolytes prn    Best practice:  Diet: Regular Pain/Anxiety/Delirium protocol (if indicated):  Precedex and PRN Ativan per CIWA VAP protocol (if indicated): NA DVT prophylaxis: SCD GI prophylaxis: Pepcid Glucose control: Glucose < 180, no SSI Mobility: As tolerated Code Status: FULL Family Communication: no family at bedside>> no family calls  Disposition Remains in ICU due to precedex infusion for EtOH withdrawal    Labs   CBC: Recent Labs  Lab 07/06/19 1701 07/07/19 0358 07/08/19 0412 07/10/19 0659 07/12/19 0501  WBC 12.6* 8.2 7.5 8.6 5.8  NEUTROABS  --   --   --  5.6  --   HGB 13.6 13.0 13.4 13.6 13.3  HCT 39.3 38.6* 38.6* 39.0 39.8  MCV 94.9 95.8 94.8 93.8 97.3  PLT 56* 50* 51* 81* 97*    Basic Metabolic Panel: Recent Labs  Lab 07/06/19 1701  07/07/19 0358 07/08/19 0412 07/10/19 0659 07/11/19 1017 07/12/19 0501  NA  --    < > 142 136 138 137 138  K  --    < > 3.4* 3.1* 2.9* 3.1* 3.8  CL  --    < > 106 102 104 106 104  CO2  --    < > 23 23 23 22 24   GLUCOSE  --    < > 105* 99 116* 141* 106*  BUN  --    < > 16 10 <5* 6 <5*  CREATININE 0.78   < > 0.54* 0.37* 0.49* 0.46* 0.42*  CALCIUM  --    < > 9.0 8.9 8.8* 9.2 9.2  MG 1.7  --  1.8  --   --   --  1.5*  PHOS 3.0  --  3.1  --   --   --   --    < > = values in this interval not displayed.   GFR: Estimated Creatinine Clearance: 113.4 mL/min (A) (by C-G formula based on SCr of 0.42 mg/dL (L)). Recent Labs  Lab 07/06/19 1932 07/06/19 2321 07/07/19 0358 07/08/19 0412 07/10/19 0659 07/12/19 0501  WBC  --   --  8.2 7.5 8.6 5.8  LATICACIDVEN 1.0 0.9  --   --   --   --     Liver Function Tests: Recent Labs  Lab 07/06/19 1300 07/10/19 0659 07/12/19 0501  AST 90* 178* 143*  ALT 61* 158* 171*  ALKPHOS 89 99 80  BILITOT 1.7* 1.0 0.5  PROT 8.1 6.9 6.5  ALBUMIN 3.4* 2.9* 2.8*   Recent Labs  Lab 07/06/19 1300  LIPASE 29   No results for input(s): AMMONIA in the last 168 hours.  ABG No  results found for: PHART, PCO2ART, PO2ART, HCO3, TCO2, ACIDBASEDEF, O2SAT   Coagulation Profile: Recent Labs  Lab 07/06/19 1932  INR 1.1    Cardiac Enzymes: No results for input(s): CKTOTAL, CKMB, CKMBINDEX, TROPONINI in the last 168 hours.  HbA1C: Hgb A1c MFr Bld  Date/Time Value Ref Range Status  04/28/2019 02:01 AM 5.4 4.8 - 5.6 % Final    Comment:    (NOTE)         Prediabetes:  5.7 - 6.4         Diabetes: >6.4         Glycemic control for adults with diabetes: <7.0     CBG: No results for input(s): GLUCAP in the last 168 hours.    CRITICAL CARE Performed by: Bevelyn Ngo   Total critical care APP time: 54 minutes    Bevelyn Ngo, MSN, AGACNP-BC [facility] Pulmonary/Critical Care Medicine Pager # [phone] After 4 pm please call [phone] 07/12/2019 7:58 AM

## 2019-07-13 LAB — COMPREHENSIVE METABOLIC PANEL
ALT: 149 U/L — ABNORMAL HIGH (ref 0–44)
AST: 97 U/L — ABNORMAL HIGH (ref 15–41)
Albumin: 3.3 g/dL — ABNORMAL LOW (ref 3.5–5.0)
Alkaline Phosphatase: 101 U/L (ref 38–126)
Anion gap: 9 (ref 5–15)
BUN: 8 mg/dL (ref 6–20)
CO2: 21 mmol/L — ABNORMAL LOW (ref 22–32)
Calcium: 9.2 mg/dL (ref 8.9–10.3)
Chloride: 108 mmol/L (ref 98–111)
Creatinine, Ser: 0.61 mg/dL (ref 0.61–1.24)
GFR calc Af Amer: >60 mL/min (ref >60)
GFR calc non Af Amer: >60 mL/min (ref >60)
Glucose, Bld: 90 mg/dL (ref 70–99)
Potassium: 4.2 mmol/L (ref 3.5–5.1)
Sodium: 138 mmol/L (ref 135–145)
Total Bilirubin: 0.6 mg/dL (ref 0.3–1.2)
Total Protein: 7.1 g/dL (ref 6.5–8.1)

## 2019-07-13 LAB — CBC
HCT: 40.1 % (ref 39.0–52.0)
Hemoglobin: 13.6 g/dL (ref 13.0–17.0)
MCH: 32.7 pg (ref 26.0–34.0)
MCHC: 33.9 g/dL (ref 30.0–36.0)
MCV: 96.4 fL (ref 80.0–100.0)
Platelets: 136 10*3/uL — ABNORMAL LOW (ref 150–400)
RBC: 4.16 MIL/uL — ABNORMAL LOW (ref 4.22–5.81)
RDW: 12.8 % (ref 11.5–15.5)
WBC: 8.6 10*3/uL (ref 4.0–10.5)
nRBC: 0 % (ref 0.0–0.2)

## 2019-07-13 LAB — MAGNESIUM: Magnesium: 1.7 mg/dL (ref 1.7–2.4)

## 2019-07-13 MED ORDER — PNEUMOCOCCAL VAC POLYVALENT 25 MCG/0.5ML IJ INJ
0.5000 mL | INJECTION | INTRAMUSCULAR | Status: AC
  Administered 2019-07-14: 0.5 mL via INTRAMUSCULAR
  Filled 2019-07-13: qty 0.5

## 2019-07-13 MED ORDER — PANTOPRAZOLE SODIUM 40 MG PO TBEC
40.0000 mg | DELAYED_RELEASE_TABLET | Freq: Every day | ORAL | Status: DC
  Administered 2019-07-13 – 2019-07-16 (×4): 40 mg via ORAL
  Filled 2019-07-13 (×4): qty 1

## 2019-07-13 MED ORDER — FOLIC ACID 1 MG PO TABS
1.0000 mg | ORAL_TABLET | Freq: Every day | ORAL | Status: DC
  Administered 2019-07-13 – 2019-07-17 (×5): 1 mg via ORAL
  Filled 2019-07-13 (×5): qty 1

## 2019-07-13 MED ORDER — VITAMIN B-1 100 MG PO TABS
100.0000 mg | ORAL_TABLET | Freq: Every day | ORAL | Status: DC
  Administered 2019-07-13 – 2019-07-17 (×5): 100 mg via ORAL
  Filled 2019-07-13 (×5): qty 1

## 2019-07-13 MED ORDER — INFLUENZA VAC SPLIT QUAD 0.5 ML IM SUSY
0.5000 mL | PREFILLED_SYRINGE | INTRAMUSCULAR | Status: AC
  Administered 2019-07-14: 0.5 mL via INTRAMUSCULAR
  Filled 2019-07-13: qty 0.5

## 2019-07-13 MED ORDER — LACTATED RINGERS IV BOLUS
500.0000 mL | Freq: Once | INTRAVENOUS | Status: AC
  Administered 2019-07-13: 500 mL via INTRAVENOUS

## 2019-07-13 MED ORDER — LORAZEPAM 2 MG/ML IJ SOLN
1.0000 mg | INTRAMUSCULAR | Status: AC | PRN
  Administered 2019-07-13: 2 mg via INTRAVENOUS
  Administered 2019-07-14: 3 mg via INTRAVENOUS
  Filled 2019-07-13: qty 1
  Filled 2019-07-13: qty 2

## 2019-07-13 MED ORDER — LORAZEPAM 1 MG PO TABS: 1.0000 mg | ORAL_TABLET | ORAL | Status: AC | PRN

## 2019-07-13 NOTE — Progress Notes (Signed)
PCCM to [facility] transfer:  Patient with h/o ETOH and cocaine abuse with prior DTs who presented to the ER on 12/2 with ETOH withdrawal seizures.  He was started on Ativan and a Precedex drip.  Off Precedex since yesterday, now doing well.  Will transfer to [facility] today.  He is planning to go home and drink again.  He may sign out AMA.  Likely needs another day or two of monitoring.   Carlyon Shadow, M.D.

## 2019-07-13 NOTE — Progress Notes (Addendum)
NAMEKrishav Peters, MRN:  [id number], DOB:  1980-10-08, LOS: 7 ADMISSION DATE:  07/06/2019, CONSULTATION DATE:  12/2 REFERRING MD:  Carmin Muskrat, CHIEF COMPLAINT:  Seizure related to ETOH withdrawal    Brief History   38yo presented to ED after being found down with what appeared to be seizure like activity. PCCM consulted for admission given need for precedex drip for control of alcohol withdrawal.   History of present illness   History obtained from records as patient was altered at initial evaluation Vincent Peters is a 38 yo with a PMX of cocaine and alcohol abuse who was brought in via EMS after bring found down on the side of the road with seizure like activity, per EMS incontinent of bladder at scene. No known history of seizures. On admission patient reported some abdominal pain and weakness. Reported last drink was 2 days prior to admission. Per chart review patient has suffered with substance abuse for many years. Per last ED visit, 02/26/2018, patient reported drinking 12 cans of beer a day and has been drinking this amount for 16 plus years.   Patient with witnessed seizure in ED with significant agitation and tremors. PRN ativan administered with little to no relief seen therefore precedex drip was initiated. Vitals signs significant for tachycardia and hypertension. Lab work with slightly decreased, elevated LFTs and mild leukocytosis.   Past Medical History  Alcohol abuse Cocaine abuse  Significant Hospital Events   Initiated on Precedex for [facility] withdrawal   Consults:  PCCM   Procedures:  none  Significant Diagnostic Tests:  12/2: CTH >> 12/2: ETOH Level < 10  Micro Data:  12/2 COVID test >>Negative  Antimicrobials:  none  Interim history/subjective:   Off Precedex No further seizure activity Awake and alert, still tachycardic  Objective   Blood pressure 132/64, pulse 94, temperature 99.9 F (37.7 C), temperature  source Axillary, resp. rate 17, height 5\' 9"  (1.753 m), weight 63 kg, SpO2 100 %.        Intake/Output Summary (Last 24 hours) at 07/13/2019 0911 Last data filed at 07/13/2019 0500 Gross per 24 hour  Intake 1399.98 ml  Output 1550 ml  Net -150.02 ml   Filed Weights   07/11/19 0500 07/12/19 0426 07/13/19 0500  Weight: 63.1 kg 63.4 kg 63 kg    Examination: General: Adult male, does not appear to be in distress HEENT: Moist oral mucosa Neuro: Following commands CV: S1-S2 appreciated PULM: Clear breath sounds GI: soft round ndnt. + bowel sounds x 4, NT, ND Extremities: BUE soft restraints. Symmetrical bulk and tone. No cyanosis or clubbing  Skin: Warm and dry  Resolved Hospital Problem list   Leukocytosis  Acidosis   Assessment & Plan:   Alcohol withdrawal Seizures in the setting of alcohol withdrawal -Now off Precedex -Continue CIWA -Seizure precautions -Thiamine and folic acid  Hypertension -Telemetry monitoring -Ativan  Alcoholic hepatitis -Trend LFTs -Ammonia level of 54  Thrombocytopenia -Likely related to alcoholism -Continue to trend  Hypokalemia Hypomagnesemia -Trend -Replace as needed  Tachycardia -Continue benzodiazepine -Encourage fluid intake -Bolus LR  Will transfer to [facility]  Best practice:  Diet: Regular diet Pain/Anxiety/Delirium protocol (if indicated): Ativan per CIWA VAP protocol (if indicated): Not indicated DVT prophylaxis: SCD GI prophylaxis: Pepcid Glucose control: Glucose < 180, no SSI Mobility: As tolerated Code Status: FULL Family Communication: Will update Disposition:  Labs   CBC: Recent Labs  Lab 07/07/19 0358 07/08/19 0412 07/10/19 0659 07/12/19 0501 07/13/19 0516  WBC  8.2 7.5 8.6 5.8 8.6  NEUTROABS  --   --  5.6  --   --   HGB 13.0 13.4 13.6 13.3 13.6  HCT 38.6* 38.6* 39.0 39.8 40.1  MCV 95.8 94.8 93.8 97.3 96.4  PLT 50* 51* 81* 97* 136*    Basic Metabolic Panel: Recent Labs  Lab 07/06/19  1701  07/07/19 0358 07/08/19 0412 07/10/19 0659 07/11/19 1017 07/12/19 0501 07/13/19 0516  NA  --    < > 142 136 138 137 138 138  K  --    < > 3.4* 3.1* 2.9* 3.1* 3.8 4.2  CL  --    < > 106 102 104 106 104 108  CO2  --    < > 23 23 23 22 24  21*  GLUCOSE  --    < > 105* 99 116* 141* 106* 90  BUN  --    < > 16 10 <5* 6 <5* 8  CREATININE 0.78   < > 0.54* 0.37* 0.49* 0.46* 0.42* 0.61  CALCIUM  --    < > 9.0 8.9 8.8* 9.2 9.2 9.2  MG 1.7  --  1.8  --   --   --  1.5* 1.7  PHOS 3.0  --  3.1  --   --   --   --   --    < > = values in this interval not displayed.   GFR: Estimated Creatinine Clearance: 112.7 mL/min (by C-G formula based on SCr of 0.61 mg/dL). Recent Labs  Lab 07/06/19 1932 07/06/19 2321  07/08/19 0412 07/10/19 0659 07/12/19 0501 07/13/19 0516  WBC  --   --    < > 7.5 8.6 5.8 8.6  LATICACIDVEN 1.0 0.9  --   --   --   --   --    < > = values in this interval not displayed.    Liver Function Tests: Recent Labs  Lab 07/06/19 1300 07/10/19 0659 07/12/19 0501 07/13/19 0516  AST 90* 178* 143* 97*  ALT 61* 158* 171* 149*  ALKPHOS 89 99 80 101  BILITOT 1.7* 1.0 0.5 0.6  PROT 8.1 6.9 6.5 7.1  ALBUMIN 3.4* 2.9* 2.8* 3.3*   Recent Labs  Lab 07/06/19 1300  LIPASE 29   Recent Labs  Lab 07/12/19 0831  AMMONIA 54*    The patient is critically ill with multiple organ systems failure and requires high complexity decision making for assessment and support, frequent evaluation and titration of therapies, application of advanced monitoring technologies and extensive interpretation of multiple databases. Critical Care Time devoted to patient care services described in this note independent of APP/resident time (if applicable)  is 31 minutes.   14/08/20 MD [facility] Personal pager: [phone] If unanswered, please page CCM On-call: #[phone]

## 2019-07-14 DIAGNOSIS — I10 Essential (primary) hypertension: Secondary | ICD-10-CM

## 2019-07-14 DIAGNOSIS — R7989 Other specified abnormal findings of blood chemistry: Secondary | ICD-10-CM

## 2019-07-14 DIAGNOSIS — D696 Thrombocytopenia, unspecified: Secondary | ICD-10-CM

## 2019-07-14 DIAGNOSIS — F10239 Alcohol dependence with withdrawal, unspecified: Secondary | ICD-10-CM

## 2019-07-14 LAB — CBC
HCT: 42.6 % (ref 39.0–52.0)
Hemoglobin: 14.4 g/dL (ref 13.0–17.0)
MCH: 32.7 pg (ref 26.0–34.0)
MCHC: 33.8 g/dL (ref 30.0–36.0)
MCV: 96.6 fL (ref 80.0–100.0)
Platelets: 186 10*3/uL (ref 150–400)
RBC: 4.41 MIL/uL (ref 4.22–5.81)
RDW: 12.6 % (ref 11.5–15.5)
WBC: 9.1 10*3/uL (ref 4.0–10.5)
nRBC: 0 % (ref 0.0–0.2)

## 2019-07-14 LAB — COMPREHENSIVE METABOLIC PANEL
ALT: 118 U/L — ABNORMAL HIGH (ref 0–44)
AST: 67 U/L — ABNORMAL HIGH (ref 15–41)
Albumin: 3.3 g/dL — ABNORMAL LOW (ref 3.5–5.0)
Alkaline Phosphatase: 87 U/L (ref 38–126)
Anion gap: 9 (ref 5–15)
BUN: 5 mg/dL — ABNORMAL LOW (ref 6–20)
CO2: 24 mmol/L (ref 22–32)
Calcium: 9.2 mg/dL (ref 8.9–10.3)
Chloride: 105 mmol/L (ref 98–111)
Creatinine, Ser: 0.52 mg/dL — ABNORMAL LOW (ref 0.61–1.24)
GFR calc Af Amer: >60 mL/min (ref >60)
GFR calc non Af Amer: >60 mL/min (ref >60)
Glucose, Bld: 110 mg/dL — ABNORMAL HIGH (ref 70–99)
Potassium: 3.4 mmol/L — ABNORMAL LOW (ref 3.5–5.1)
Sodium: 138 mmol/L (ref 135–145)
Total Bilirubin: 0.8 mg/dL (ref 0.3–1.2)
Total Protein: 7.6 g/dL (ref 6.5–8.1)

## 2019-07-14 LAB — MAGNESIUM: Magnesium: 1.7 mg/dL (ref 1.7–2.4)

## 2019-07-14 MED ORDER — AMLODIPINE BESYLATE 5 MG PO TABS
5.0000 mg | ORAL_TABLET | Freq: Every day | ORAL | Status: DC
  Administered 2019-07-14 – 2019-07-17 (×4): 5 mg via ORAL
  Filled 2019-07-14 (×4): qty 1

## 2019-07-14 MED ORDER — POTASSIUM CHLORIDE CRYS ER 20 MEQ PO TBCR
40.0000 meq | EXTENDED_RELEASE_TABLET | Freq: Once | ORAL | Status: AC
  Administered 2019-07-14: 40 meq via ORAL
  Filled 2019-07-14: qty 2

## 2019-07-14 NOTE — Progress Notes (Signed)
PROGRESS NOTE    Vincent Peters  BJY:[id number] DOB: 06-Nov-1980 DOA: 07/06/2019 PCP: Patient, No Pcp Per   Brief Narrative:  HPI on 07/06/2019 by Ms. Jackelyn Knife, NP (PCCM) Vincent Peters is a 38 yo with a PMX of cocaine and alcohol abuse who was brought in via EMS after bring found down on the side of the road with seizure like activity, per EMS incontinent of bladder at seen. No known history of seizures. On admission patient reported some abdominal pain and weakness. Reported last drink was 2 days prior to admission. Per chart review patient has suffered with substance abuse for many years. Per last ED visit, 02/26/2018, patient reported drinking 12 cans of beer a day and has been drinking this amount for 16 plus years.   Patient with witnessed seizure in ED with significant agitation and tremors. PRN ativan administered with little to no relief seen therefore precedex drip was initiated. Vitals signs significant for tachycardia and hypertension. Lab work with slightly decreased, elevated LFTs and mild leukocytosis.   Interim history Admitted for alcohol withdrawal and seizure to ICU. Required precedex drip as well as ativan. Has improved and TRH assumed care. Currently patient with accelerated HTN.   Assessment & Plan   Alcohol withdrawal/seizure secondary to alcohol withdrawal -Patient was admitted to the ICU and did require Precedex drip -Continue CIWA protocol, seizure precautions, multivitamin, thiamine, and folic acid -Patient continues to have tremor -He has no intentions of alcohol cessation -Social work consulted  Accelerated hypertension -Patient with systolic BP of 086 today -Continue labetalol and hydralazine as needed -Will start him on low-dose amlodipine.  Using this for ease of 1 pill/day in hopes that he will continue this on upon discharge  Alcoholic hepatitis with hyperammonemia -Mental status appears to be clear at this point  -Patient was placed on lactulose -LFTs are trending downward, will continue to monitor-  Thrombocytopenia -Suspect secondary to alcoholism -Platelets have improved, today 186  Hypokalemia -Will replace and continue to monitor BMP  Hypomagnesemia -Magnesium 1.7 today, continue to monitor and replace as needed  Tachycardia -Likely secondary to the above, has improved  DVT Prophylaxis  SCDs  Code Status: Full  Family Communication: None at bedside  Disposition Plan: Admitted. Pending improvement in hypertension, tremors and alcohol withdrawal.  Disposition likely home within the next 24 to 48 hours.  Consultants PCCM  Procedures  None  Antibiotics   Anti-infectives (From admission, onward)   None      Subjective:   Vincent Peters seen and examined today.  Patient states he is not feeling very well this morning.  Feels he needs to be in the hospital for another day.  Patient has no intentions of alcohol cessation.  Denies current chest pain or shortness of breath, abdominal pain, nausea or vomiting, diarrhea or constipation, dizziness or headache.  Objective:   Vitals:   07/13/19 2300 07/14/19 0000 07/14/19 0400 07/14/19 0808  BP: (!) 155/119 (!) 152/85    Pulse:  87 76   Resp: 14 15 16    Temp:  98.3 F (36.8 C) 99.6 F (37.6 C) 99.4 F (37.4 C)  TempSrc:  Oral Oral Oral  SpO2:  96% 99%   Weight:      Height:        Intake/Output Summary (Last 24 hours) at 07/14/2019 0939 Last data filed at 07/14/2019 0853 Gross per 24 hour  Intake -  Output 4300 ml  Net -4300 ml   Filed Weights   07/11/19 0500  07/12/19 0426 07/13/19 0500  Weight: 63.1 kg 63.4 kg 63 kg    Exam  General: Well developed, well nourished, NAD, appears stated age  HEENT: NCAT,  mucous membranes moist.   Cardiovascular: S1 S2 auscultated, no rubs, murmurs or gallops. Regular rate and rhythm.  Respiratory: Clear to auscultation bilaterally   Abdomen: Soft, nontender,  nondistended, + bowel sounds  Extremities: warm dry without cyanosis clubbing or edema  Neuro: AAOx3, nonfocal, slight upper ext tremor   Skin: Without rashes exudates or nodules, dry facial skin  Psych: Normal affect and demeanor   Data Reviewed: I have personally reviewed following labs and imaging studies  CBC: Recent Labs  Lab 07/08/19 0412 07/10/19 0659 07/12/19 0501 07/13/19 0516 07/14/19 0724  WBC 7.5 8.6 5.8 8.6 9.1  NEUTROABS  --  5.6  --   --   --   HGB 13.4 13.6 13.3 13.6 14.4  HCT 38.6* 39.0 39.8 40.1 42.6  MCV 94.8 93.8 97.3 96.4 96.6  PLT 51* 81* 97* 136* 186   Basic Metabolic Panel: Recent Labs  Lab 07/10/19 0659 07/11/19 1017 07/12/19 0501 07/13/19 0516 07/14/19 0724  NA 138 137 138 138 138  K 2.9* 3.1* 3.8 4.2 3.4*  CL 104 106 104 108 105  CO2 23 22 24  21* 24  GLUCOSE 116* 141* 106* 90 110*  BUN <5* 6 <5* 8 5*  CREATININE 0.49* 0.46* 0.42* 0.61 0.52*  CALCIUM 8.8* 9.2 9.2 9.2 9.2  MG  --   --  1.5* 1.7 1.7   GFR: Estimated Creatinine Clearance: 112.7 mL/min (A) (by C-G formula based on SCr of 0.52 mg/dL (L)). Liver Function Tests: Recent Labs  Lab 07/10/19 0659 07/12/19 0501 07/13/19 0516 07/14/19 0724  AST 178* 143* 97* 67*  ALT 158* 171* 149* 118*  ALKPHOS 99 80 101 87  BILITOT 1.0 0.5 0.6 0.8  PROT 6.9 6.5 7.1 7.6  ALBUMIN 2.9* 2.8* 3.3* 3.3*   No results for input(s): LIPASE, AMYLASE in the last 168 hours. Recent Labs  Lab 07/12/19 0831  AMMONIA 54*   Coagulation Profile: No results for input(s): INR, PROTIME in the last 168 hours. Cardiac Enzymes: No results for input(s): CKTOTAL, CKMB, CKMBINDEX, TROPONINI in the last 168 hours. BNP (last 3 results) No results for input(s): PROBNP in the last 8760 hours. HbA1C: No results for input(s): HGBA1C in the last 72 hours. CBG: No results for input(s): GLUCAP in the last 168 hours. Lipid Profile: No results for input(s): CHOL, HDL, LDLCALC, TRIG, CHOLHDL, LDLDIRECT in the  last 72 hours. Thyroid Function Tests: No results for input(s): TSH, T4TOTAL, FREET4, T3FREE, THYROIDAB in the last 72 hours. Anemia Panel: No results for input(s): VITAMINB12, FOLATE, FERRITIN, TIBC, IRON, RETICCTPCT in the last 72 hours. Urine analysis:    Component Value Date/Time   COLORURINE AMBER (A) 07/07/2019 0142   APPEARANCEUR HAZY (A) 07/07/2019 0142   LABSPEC 1.028 07/07/2019 0142   PHURINE 6.0 07/07/2019 0142   GLUCOSEU NEGATIVE 07/07/2019 0142   HGBUR LARGE (A) 07/07/2019 0142   BILIRUBINUR NEGATIVE 07/07/2019 0142   KETONESUR 80 (A) 07/07/2019 0142   PROTEINUR 100 (A) 07/07/2019 0142   UROBILINOGEN 0.2 01/17/2014 0136   NITRITE NEGATIVE 07/07/2019 0142   LEUKOCYTESUR NEGATIVE 07/07/2019 0142   Sepsis Labs: @LABRCNTIP (procalcitonin:4,lacticidven:4)  ) Recent Results (from the past 240 hour(s))  SARS CORONAVIRUS 2 (TAT 6-24 HRS) Nasopharyngeal Nasopharyngeal Swab     Status: None   Collection Time: 07/06/19  4:00 PM   Specimen: Nasopharyngeal  Swab  Result Value Ref Range Status   SARS Coronavirus 2 NEGATIVE NEGATIVE Final    Comment: (NOTE) SARS-CoV-2 target nucleic acids are NOT DETECTED. The SARS-CoV-2 RNA is generally detectable in upper and lower respiratory specimens during the acute phase of infection. Negative results do not preclude SARS-CoV-2 infection, do not rule out co-infections with other pathogens, and should not be used as the sole basis for treatment or other patient management decisions. Negative results must be combined with clinical observations, patient history, and epidemiological information. The expected result is Negative. Fact Sheet for Patients: HairSlick.no Fact Sheet for Healthcare Providers: quierodirigir.com This test is not yet approved or cleared by the [place] FDA and  has been authorized for detection and/or diagnosis of SARS-CoV-2 by FDA under an Emergency Use  Authorization (EUA). This EUA will remain  in effect (meaning this test can be used) for the duration of the COVID-19 declaration under Section 56 4(b)(1) of the Act, 21 U.S.C. section 360bbb-3(b)(1), unless the authorization is terminated or revoked sooner. Performed at [facility], [address] [place], [place] [address]   MRSA PCR Screening     Status: None   Collection Time: 07/06/19  5:15 PM   Specimen: Nasal Mucosa; Nasopharyngeal  Result Value Ref Range Status   MRSA by PCR NEGATIVE NEGATIVE Final    Comment:        The GeneXpert MRSA Assay (FDA approved for NASAL specimens only), is one component of a comprehensive MRSA colonization surveillance program. It is not intended to diagnose MRSA infection nor to guide or monitor treatment for MRSA infections. Performed at [facility], [address], [place], [place] [address]       Radiology Studies: No results found.   Scheduled Meds: . amLODipine  5 mg Oral Daily  . Chlorhexidine Gluconate Cloth  6 each Topical Daily  . folic acid  1 mg Oral Daily  . influenza vac split quadrivalent PF  0.5 mL Intramuscular Tomorrow-1000  . lactulose  20 g Oral BID  . multivitamin with minerals  1 tablet Oral Daily  . pantoprazole  40 mg Oral QHS  . pneumococcal 23 valent vaccine  0.5 mL Intramuscular Tomorrow-1000  . potassium chloride  40 mEq Oral Once  . QUEtiapine  25 mg Oral BID  . thiamine  100 mg Oral Daily   Continuous Infusions:   LOS: 8 days   Time Spent in minutes   45 minutes  Nitza Schmid D.O. on 07/14/2019 at 9:39 AM  Between 7am to 7pm - Please see pager noted on [email or web address]  After 7pm go to [email or web address]  And look for the night coverage person covering for me after hours  [facility] Office  [phone]

## 2019-07-15 LAB — BASIC METABOLIC PANEL
Anion gap: 10 (ref 5–15)
BUN: 5 mg/dL — ABNORMAL LOW (ref 6–20)
CO2: 26 mmol/L (ref 22–32)
Calcium: 9.7 mg/dL (ref 8.9–10.3)
Chloride: 101 mmol/L (ref 98–111)
Creatinine, Ser: 0.64 mg/dL (ref 0.61–1.24)
GFR calc Af Amer: >60 mL/min (ref >60)
GFR calc non Af Amer: >60 mL/min (ref >60)
Glucose, Bld: 117 mg/dL — ABNORMAL HIGH (ref 70–99)
Potassium: 3.9 mmol/L (ref 3.5–5.1)
Sodium: 137 mmol/L (ref 135–145)

## 2019-07-15 LAB — MAGNESIUM: Magnesium: 1.6 mg/dL — ABNORMAL LOW (ref 1.7–2.4)

## 2019-07-15 LAB — HEPATIC FUNCTION PANEL
ALT: 95 U/L — ABNORMAL HIGH (ref 0–44)
AST: 54 U/L — ABNORMAL HIGH (ref 15–41)
Albumin: 3.3 g/dL — ABNORMAL LOW (ref 3.5–5.0)
Alkaline Phosphatase: 88 U/L (ref 38–126)
Bilirubin, Direct: 0.1 mg/dL (ref 0.0–0.2)
Indirect Bilirubin: 0.7 mg/dL (ref 0.3–0.9)
Total Bilirubin: 0.8 mg/dL (ref 0.3–1.2)
Total Protein: 7.3 g/dL (ref 6.5–8.1)

## 2019-07-15 LAB — AMMONIA: Ammonia: 40 umol/L — ABNORMAL HIGH (ref 9–35)

## 2019-07-15 MED ORDER — MAGNESIUM SULFATE 2 GM/50ML IV SOLN
2.0000 g | Freq: Once | INTRAVENOUS | Status: AC
  Administered 2019-07-15: 2 g via INTRAVENOUS
  Filled 2019-07-15: qty 50

## 2019-07-15 NOTE — Progress Notes (Signed)
PROGRESS NOTE    Vincent Peters  ZOX:[id number] DOB: 05/05/1981 DOA: 07/06/2019 PCP: Patient, No Pcp Per   Brief Narrative:  HPI on 07/06/2019 by Ms. Lysbeth GalasWhitnes Davis, NP (PCCM) [name] is a 38 yo with a PMX of cocaine and alcohol abuse who was brought in via EMS after bring found down on the side of the road with seizure like activity, per EMS incontinent of bladder at seen. No known history of seizures. On admission patient reported some abdominal pain and weakness. Reported last drink was 2 days prior to admission. Per chart review patient has suffered with substance abuse for many years. Per last ED visit, 02/26/2018, patient reported drinking 12 cans of beer a day and has been drinking this amount for 16 plus years.   Patient with witnessed seizure in ED with significant agitation and tremors. PRN ativan administered with little to no relief seen therefore precedex drip was initiated. Vitals signs significant for tachycardia and hypertension. Lab work with slightly decreased, elevated LFTs and mild leukocytosis.   Interim history Admitted for alcohol withdrawal and seizure to ICU. Required precedex drip as well as ativan. Has improved and TRH assumed care. Currently patient with accelerated HTN.   Assessment & Plan   Alcohol withdrawal/seizure secondary to alcohol withdrawal -Patient was admitted to the ICU and did require Precedex drip -Continue CIWA protocol, seizure precautions, multivitamin, thiamine, and folic acid -Patient continues to have tremor -He has no intentions of alcohol cessation -Social work consulted  Accelerated hypertension -Patient with systolic BP of up to 200- however more controlled today -Continue labetalol and hydralazine as needed -Will start him on low-dose amlodipine.  Using this for ease of 1 pill/day in hopes that he will continue this on upon discharge  Alcoholic hepatitis with hyperammonemia -Mental status  appears to be clear at this point -Patient was placed on lactulose -LFTs are trending downward, will continue to monitor  Thrombocytopenia -Suspect secondary to alcoholism -Platelets have improved, 186  Hypokalemia -resolved with replacement -continue to monitor   Hypomagnesemia -Magnesium 1.6 today, replace and monitor   Tachycardia -Likely secondary to the above, has improved  DVT Prophylaxis  SCDs  Code Status: Full  Family Communication: None at bedside  Disposition Plan: Admitted. Pending improvement in hypertension, tremors and alcohol withdrawal.  Disposition likely home within the next 24 to 48 hours.  Consultants PCCM  Procedures  None  Antibiotics   Anti-infectives (From admission, onward)   None      Subjective:   Vincent Peters seen and examined today.  Patient states he feels a little sick this morning.  Denies current chest pain or shortness of breath, abdominal pain, nausea or vomiting, diarrhea or constipation, dizziness or headache.  Does feel shaky.  Objective:   Vitals:   07/15/19 0300 07/15/19 0500 07/15/19 0833 07/15/19 1113  BP: (!) 142/90  (!) 141/90 (!) 155/97  Pulse: 82   76  Resp:   17 18  Temp: 100.1 F (37.8 C)  99.1 F (37.3 C) 98.4 F (36.9 C)  TempSrc: Oral  Oral Oral  SpO2: 98%  97% 90%  Weight:  60.7 kg    Height:        Intake/Output Summary (Last 24 hours) at 07/15/2019 1257 Last data filed at 07/15/2019 0830 Gross per 24 hour  Intake 240 ml  Output 1250 ml  Net -1010 ml   Filed Weights   07/12/19 0426 07/13/19 0500 07/15/19 0500  Weight: 63.4 kg 63 kg 60.7 kg  Exam  General: Well developed, well nourished, NAD, appears stated age  HEENT: NCAT, mucous membranes moist.   Cardiovascular: S1 S2 auscultated, RRR, no murmur  Respiratory: Clear to auscultation bilaterally with equal chest rise  Abdomen: Soft, nontender, nondistended, + bowel sounds  Extremities: warm dry without cyanosis  clubbing or edema  Neuro: AAOx3, nonfocal, upper extremity tremor with arms extended  Skin: Without rashes exudates or nodules, dry flaky facial skin  Psych: Pleasant, appropriate mood and affect  Data Reviewed: I have personally reviewed following labs and imaging studies  CBC: Recent Labs  Lab 07/10/19 0659 07/12/19 0501 07/13/19 0516 07/14/19 0724  WBC 8.6 5.8 8.6 9.1  NEUTROABS 5.6  --   --   --   HGB 13.6 13.3 13.6 14.4  HCT 39.0 39.8 40.1 42.6  MCV 93.8 97.3 96.4 96.6  PLT 81* 97* 136* 186   Basic Metabolic Panel: Recent Labs  Lab 07/11/19 1017 07/12/19 0501 07/13/19 0516 07/14/19 0724 07/15/19 0556  NA 137 138 138 138 137  K 3.1* 3.8 4.2 3.4* 3.9  CL 106 104 108 105 101  CO2 22 24 21* 24 26  GLUCOSE 141* 106* 90 110* 117*  BUN 6 <5* 8 5* 5*  CREATININE 0.46* 0.42* 0.61 0.52* 0.64  CALCIUM 9.2 9.2 9.2 9.2 9.7  MG  --  1.5* 1.7 1.7 1.6*   GFR: Estimated Creatinine Clearance: 108.5 mL/min (by C-G formula based on SCr of 0.64 mg/dL). Liver Function Tests: Recent Labs  Lab 07/10/19 0659 07/12/19 0501 07/13/19 0516 07/14/19 0724  AST 178* 143* 97* 67*  ALT 158* 171* 149* 118*  ALKPHOS 99 80 101 87  BILITOT 1.0 0.5 0.6 0.8  PROT 6.9 6.5 7.1 7.6  ALBUMIN 2.9* 2.8* 3.3* 3.3*   No results for input(s): LIPASE, AMYLASE in the last 168 hours. Recent Labs  Lab 07/12/19 0831  AMMONIA 54*   Coagulation Profile: No results for input(s): INR, PROTIME in the last 168 hours. Cardiac Enzymes: No results for input(s): CKTOTAL, CKMB, CKMBINDEX, TROPONINI in the last 168 hours. BNP (last 3 results) No results for input(s): PROBNP in the last 8760 hours. HbA1C: No results for input(s): HGBA1C in the last 72 hours. CBG: No results for input(s): GLUCAP in the last 168 hours. Lipid Profile: No results for input(s): CHOL, HDL, LDLCALC, TRIG, CHOLHDL, LDLDIRECT in the last 72 hours. Thyroid Function Tests: No results for input(s): TSH, T4TOTAL, FREET4, T3FREE,  THYROIDAB in the last 72 hours. Anemia Panel: No results for input(s): VITAMINB12, FOLATE, FERRITIN, TIBC, IRON, RETICCTPCT in the last 72 hours. Urine analysis:    Component Value Date/Time   COLORURINE AMBER (A) 07/07/2019 0142   APPEARANCEUR HAZY (A) 07/07/2019 0142   LABSPEC 1.028 07/07/2019 0142   PHURINE 6.0 07/07/2019 0142   GLUCOSEU NEGATIVE 07/07/2019 0142   HGBUR LARGE (A) 07/07/2019 0142   BILIRUBINUR NEGATIVE 07/07/2019 0142   KETONESUR 80 (A) 07/07/2019 0142   PROTEINUR 100 (A) 07/07/2019 0142   UROBILINOGEN 0.2 01/17/2014 0136   NITRITE NEGATIVE 07/07/2019 0142   LEUKOCYTESUR NEGATIVE 07/07/2019 0142   Sepsis Labs: @LABRCNTIP (procalcitonin:4,lacticidven:4)  ) Recent Results (from the past 240 hour(s))  SARS CORONAVIRUS 2 (TAT 6-24 HRS) Nasopharyngeal Nasopharyngeal Swab     Status: None   Collection Time: 07/06/19  4:00 PM   Specimen: Nasopharyngeal Swab  Result Value Ref Range Status   SARS Coronavirus 2 NEGATIVE NEGATIVE Final    Comment: (NOTE) SARS-CoV-2 target nucleic acids are NOT DETECTED. The SARS-CoV-2 RNA is  generally detectable in upper and lower respiratory specimens during the acute phase of infection. Negative results do not preclude SARS-CoV-2 infection, do not rule out co-infections with other pathogens, and should not be used as the sole basis for treatment or other patient management decisions. Negative results must be combined with clinical observations, patient history, and epidemiological information. The expected result is Negative. Fact Sheet for Patients: SugarRoll.be Fact Sheet for Healthcare Providers: https://www.woods-mathews.com/ This test is not yet approved or cleared by the [place] FDA and  has been authorized for detection and/or diagnosis of SARS-CoV-2 by FDA under an Emergency Use Authorization (EUA). This EUA will remain  in effect (meaning this test can be used) for the  duration of the COVID-19 declaration under Section 56 4(b)(1) of the Act, 21 U.S.C. section 360bbb-3(b)(1), unless the authorization is terminated or revoked sooner. Performed at [facility], [place] [address], [place], [place] [address]   MRSA PCR Screening     Status: None   Collection Time: 07/06/19  5:15 PM   Specimen: Nasal Mucosa; Nasopharyngeal  Result Value Ref Range Status   MRSA by PCR NEGATIVE NEGATIVE Final    Comment:        The GeneXpert MRSA Assay (FDA approved for NASAL specimens only), is one component of a comprehensive MRSA colonization surveillance program. It is not intended to diagnose MRSA infection nor to guide or monitor treatment for MRSA infections. Performed at [facility], [place] [address], [place], [place] [address]       Radiology Studies: No results found.   Scheduled Meds: . amLODipine  5 mg Oral Daily  . Chlorhexidine Gluconate Cloth  6 each Topical Daily  . folic acid  1 mg Oral Daily  . lactulose  20 g Oral BID  . multivitamin with minerals  1 tablet Oral Daily  . pantoprazole  40 mg Oral QHS  . QUEtiapine  25 mg Oral BID  . thiamine  100 mg Oral Daily   Continuous Infusions:   LOS: 9 days   Time Spent in minutes   30 minutes  Burlon Centrella D.O. on 07/15/2019 at 12:57 PM  Between 7am to 7pm - Please see pager noted on [email or web address]  After 7pm go to [email or web address]  And look for the night coverage person covering for me after hours  [facility] Office  [phone]

## 2019-07-16 DIAGNOSIS — E876 Hypokalemia: Secondary | ICD-10-CM

## 2019-07-16 LAB — COMPREHENSIVE METABOLIC PANEL WITH GFR
ALT: 94 U/L — ABNORMAL HIGH (ref 0–44)
AST: 64 U/L — ABNORMAL HIGH (ref 15–41)
Albumin: 3.3 g/dL — ABNORMAL LOW (ref 3.5–5.0)
Alkaline Phosphatase: 87 U/L (ref 38–126)
Anion gap: 10 (ref 5–15)
BUN: 5 mg/dL — ABNORMAL LOW (ref 6–20)
CO2: 26 mmol/L (ref 22–32)
Calcium: 9.2 mg/dL (ref 8.9–10.3)
Chloride: 104 mmol/L (ref 98–111)
Creatinine, Ser: 0.65 mg/dL (ref 0.61–1.24)
GFR calc Af Amer: >60 mL/min
GFR calc non Af Amer: >60 mL/min
Glucose, Bld: 76 mg/dL (ref 70–99)
Potassium: 3 mmol/L — ABNORMAL LOW (ref 3.5–5.1)
Sodium: 140 mmol/L (ref 135–145)
Total Bilirubin: 0.9 mg/dL (ref 0.3–1.2)
Total Protein: 7.6 g/dL (ref 6.5–8.1)

## 2019-07-16 LAB — MAGNESIUM: Magnesium: 1.9 mg/dL (ref 1.7–2.4)

## 2019-07-16 MED ORDER — POTASSIUM CHLORIDE CRYS ER 20 MEQ PO TBCR
60.0000 meq | EXTENDED_RELEASE_TABLET | Freq: Once | ORAL | Status: AC
  Administered 2019-07-16: 60 meq via ORAL
  Filled 2019-07-16: qty 3

## 2019-07-16 MED ORDER — AMLODIPINE BESYLATE 5 MG PO TABS: 5.0000 mg | ORAL_TABLET | Freq: Every day | ORAL | 1 refills | Status: DC

## 2019-07-16 MED ORDER — ADULT MULTIVITAMIN W/MINERALS CH: 1.0000 | ORAL_TABLET | Freq: Every day | ORAL | Status: DC

## 2019-07-16 MED ORDER — AMLODIPINE BESYLATE 5 MG PO TABS: 5.0000 mg | ORAL_TABLET | Freq: Every day | ORAL | 0 refills | Status: DC

## 2019-07-16 MED ORDER — THIAMINE HCL 100 MG PO TABS: 100.0000 mg | ORAL_TABLET | Freq: Every day | ORAL | Status: DC

## 2019-07-16 MED ORDER — PANTOPRAZOLE SODIUM 40 MG PO TBEC: 40.0000 mg | DELAYED_RELEASE_TABLET | Freq: Every day | ORAL | 1 refills | Status: DC

## 2019-07-16 MED ORDER — LACTULOSE 10 GM/15ML PO SOLN: 20.0000 g | Freq: Two times a day (BID) | ORAL | 0 refills | Status: DC

## 2019-07-16 MED ORDER — FOLIC ACID 1 MG PO TABS: 1.0000 mg | ORAL_TABLET | Freq: Every day | ORAL | Status: DC

## 2019-07-16 NOTE — Progress Notes (Signed)
CSW contacted by RN requiring change to discharge location. Per patient he cannot return to where he was staying. Patient provided the phone number for a friend. CSW is working on locating housing resources in [place] to provide patient as it is unclear how long he can stay with his friend.

## 2019-07-16 NOTE — Progress Notes (Signed)
Pt told RN his friend "Beverely Low" [phone] could come an pick him up from the hospital. The Pt said that he could not call Beverely Low until he was off work at [employer]. RN attempted to call Beverely Low and the line was busy. At [place] Pt notified RN that Beverely Low could not come and pick him up tonight but his cousin could tomorrow. RN notified MD.

## 2019-07-16 NOTE — Care Management (Signed)
[place] letter provided with instructions in [place] and [place].  Pharmacy list given. RN will deliver.

## 2019-07-16 NOTE — Discharge Summary (Signed)
Physician Discharge Summary  Vincent Peters PYK:[id number] DOB: March 27, 1981 DOA: 07/06/2019  PCP: Patient, No Pcp Per  Admit date: 07/06/2019 Discharge date: 07/16/2019  Time spent: 45 minutes  Recommendations for Outpatient Follow-up:  Patient will be discharged to home.  Patient will need to follow up with primary care provider within one week of discharge, repeat CMP and magnesium.  Patient should continue medications as prescribed.  Patient should follow a heart healthy diet. Abstain from alcohol use.  Discharge Diagnoses:  Alcohol withdrawal/seizure secondary to alcohol withdrawal Accelerated hypertension Alcoholic hepatitis with hyperammonemia Thrombocytopenia Hypokalemia Hypomagnesemia Tachycardia  Discharge Condition: Stable  Diet recommendation: heart healthy  Filed Weights   07/13/19 0500 07/15/19 0500 07/16/19 0329  Weight: 63 kg 60.7 kg 60 kg    History of present illness:  on 07/06/2019 by [name], NP (PCCM) Vincent Peters is a 38 yo with a PMX of cocaine and alcohol abuse who was brought in via EMS after bring found down on the side of the road with seizure like activity, per EMS incontinent of bladder at seen. No known history of seizures. On admission patient reported some abdominal pain and weakness. Reported last drink was 2 days prior to admission. Per chart review patient has suffered with substance abuse for many years. Per last ED visit, 02/26/2018, patient reported drinking 12 cans of beer a day and has been drinking this amount for 16 plus years.   Patient with witnessed seizure in ED with significant agitation and tremors. PRN ativan administered with little to no relief seen therefore precedex drip was initiated. Vitals signs significant for tachycardia and hypertension. Lab work with slightly decreased, elevated LFTs and mild leukocytosis.   Hospital Course:  Alcohol withdrawal/seizure secondary to alcohol  withdrawal -Patient was admitted to the ICU and did require Precedex drip -Continue CIWA protocol, seizure precautions, multivitamin, thiamine, and folic acid -Patient continues to have tremor -He has no intentions of alcohol cessation -Social work consulted  Accelerated hypertension -Patient with systolic BP of up to 200- however more controlled today -Continue labetalol and hydralazine as needed -Continue amlodipine (Using this for ease of 1 pill/day in hopes that he will continue this on upon discharge)  Alcoholic hepatitis with hyperammonemia -Mental status appears to be clear at this point -Patient was placed on lactulose -LFTs are trending downward -repeat CMP in one week  Thrombocytopenia -Suspect secondary to alcoholism -Platelets have improved, 186  Hypokalemia -repalced -repeat BMP in one week  Hypomagnesemia -replaced -repeat magnesium level in one week  Tachycardia -Likely secondary to the above, has improved  Consultants PCCM  Procedures  None  Discharge Exam: Vitals:   07/16/19 0434 07/16/19 0853  BP: (!) 163/97 (!) 139/97  Pulse: 94 91  Resp: 12 14  Temp: 98.4 F (36.9 C) 99.1 F (37.3 C)  SpO2:  100%     General: Well developed, well nourished, NAD, appears stated age  HEENT: NCAT, mucous membranes moist.  Cardiovascular: S1 S2 auscultated, RRR  Respiratory: Clear to auscultation bilaterally   Abdomen: Soft, nontender, nondistended, + bowel sounds  Extremities: warm dry without cyanosis clubbing or edema  Neuro: AAOx3, nonfocal  Psych: Pleasant, appropriate mood and affect  Discharge Instructions Discharge Instructions    Discharge instructions   Complete by: As directed    Patient will be discharged to home.  Patient will need to follow up with primary care provider within one week of discharge, repeat CMP and magnesium.  Patient should continue medications as prescribed.  Patient  should follow a heart healthy diet.  Abstain from alcohol use.     Allergies as of 07/16/2019   No Known Allergies     Medication List    TAKE these medications   amLODipine 5 MG tablet Commonly known as: NORVASC Take 1 tablet (5 mg total) by mouth daily. Start taking on: July 17, 2019   folic acid 1 MG tablet Commonly known as: FOLVITE Take 1 tablet (1 mg total) by mouth daily. Start taking on: July 17, 2019   lactulose 10 GM/15ML solution Commonly known as: CHRONULAC Take 30 mLs (20 g total) by mouth 2 (two) times daily.   multivitamin with minerals Tabs tablet Take 1 tablet by mouth daily. Start taking on: July 17, 2019   pantoprazole 40 MG tablet Commonly known as: PROTONIX Take 1 tablet (40 mg total) by mouth at bedtime.   thiamine 100 MG tablet Take 1 tablet (100 mg total) by mouth daily. Start taking on: July 17, 2019      No Known Allergies Follow-up Information    [facility]. Schedule an appointment as soon as possible for a visit in 1 month(s).   Why: Hospital follow up Contact information: [address] [place] [id number] [phone]           The results of significant diagnostics from this hospitalization (including imaging, microbiology, ancillary and laboratory) are listed below for reference.    Significant Diagnostic Studies: CT HEAD WO CONTRAST  Result Date: 07/06/2019 CLINICAL DATA:  Seizure EXAM: CT HEAD WITHOUT CONTRAST TECHNIQUE: Contiguous axial images were obtained from the base of the skull through the vertex without intravenous contrast. COMPARISON:  08/31/2008 FINDINGS: Brain: No acute intracranial abnormality. Specifically, no hemorrhage, hydrocephalus, mass lesion, acute infarction, or significant intracranial injury. Vascular: No hyperdense vessel or unexpected calcification. Skull: No acute calvarial abnormality. Sinuses/Orbits: Mucosal thickening.  No air-fluid levels. Other: None IMPRESSION:  No acute intracranial abnormality. Electronically Signed   By: Charlett NoseKevin  Dover M.D.   On: 07/06/2019 22:37   US Abdomen Limited  Result Date: 07/06/2019 CLINICAL DATA:  Elevated LFTs EXAM: ULTRASOUND ABDOMEN LIMITED RIGHT UPPER QUADRANT COMPARISON:  April 28, 2019 FINDINGS: Gallbladder: No gallstones or wall thickening visualized. No sonographic Murphy sign noted by sonographer. Common bile duct: Diameter: 1.5 mm Liver: Increased echotexture seen throughout. No focal abnormality or biliary ductal dilatation. Portal vein is patent on color Doppler imaging with normal direction of blood flow towards the liver. Other: None. IMPRESSION: Normal appearing gallbladder. Hepatic steatosis Electronically Signed   By: Jonna ClarkBindu  Avutu M.D.   On: 07/06/2019 20:27    Microbiology: Recent Results (from the past 240 hour(s))  SARS CORONAVIRUS 2 (TAT 6-24 HRS) Nasopharyngeal Nasopharyngeal Swab     Status: None   Collection Time: 07/06/19  4:00 PM   Specimen: Nasopharyngeal Swab  Result Value Ref Range Status   SARS Coronavirus 2 NEGATIVE NEGATIVE Final    Comment: (NOTE) SARS-CoV-2 target nucleic acids are NOT DETECTED. The SARS-CoV-2 RNA is generally detectable in upper and lower respiratory specimens during the acute phase of infection. Negative results do not preclude SARS-CoV-2 infection, do not rule out co-infections with other pathogens, and should not be used as the sole basis for treatment or other patient management decisions. Negative results must be combined with clinical observations, patient history, and epidemiological information. The expected result is Negative. Fact Sheet for Patients: HairSlick.nohttps://www.fda.gov/media/138098/download Fact Sheet for Healthcare Providers: quierodirigir.comhttps://www.fda.gov/media/138095/download This test is not yet approved or cleared  by the [place] and  has been authorized for detection and/or diagnosis of SARS-CoV-2 by FDA under an Emergency Use Authorization (EUA).  This EUA will remain  in effect (meaning this test can be used) for the duration of the COVID-19 declaration under Section 56 4(b)(1) of the Act, 21 U.S.C. section 360bbb-3(b)(1), unless the authorization is terminated or revoked sooner. Performed at [facility], [place] [address] [place], [place] [address]   MRSA PCR Screening     Status: None   Collection Time: 07/06/19  5:15 PM   Specimen: Nasal Mucosa; Nasopharyngeal  Result Value Ref Range Status   MRSA by PCR NEGATIVE NEGATIVE Final    Comment:        The GeneXpert MRSA Assay (FDA approved for NASAL specimens only), is one component of a comprehensive MRSA colonization surveillance program. It is not intended to diagnose MRSA infection nor to guide or monitor treatment for MRSA infections. Performed at [facility], [place] [address], [place], [place] [address]      Labs: Basic Metabolic Panel: Recent Labs  Lab 07/12/19 0501 07/13/19 0516 07/14/19 0724 07/15/19 0556 07/16/19 0524  NA 138 138 138 137 140  K 3.8 4.2 3.4* 3.9 3.0*  CL 104 108 105 101 104  CO2 24 21* 24 26 26   GLUCOSE 106* 90 110* 117* 76  BUN <5* 8 5* 5* 5*  CREATININE 0.42* 0.61 0.52* 0.64 0.65  CALCIUM 9.2 9.2 9.2 9.7 9.2  MG 1.5* 1.7 1.7 1.6*  --    Liver Function Tests: Recent Labs  Lab 07/12/19 0501 07/13/19 0516 07/14/19 0724 07/15/19 1348 07/16/19 0524  AST 143* 97* 67* 54* 64*  ALT 171* 149* 118* 95* 94*  ALKPHOS 80 101 87 88 87  BILITOT 0.5 0.6 0.8 0.8 0.9  PROT 6.5 7.1 7.6 7.3 7.6  ALBUMIN 2.8* 3.3* 3.3* 3.3* 3.3*   No results for input(s): LIPASE, AMYLASE in the last 168 hours. Recent Labs  Lab 07/12/19 0831 07/15/19 1348  AMMONIA 54* 40*   CBC: Recent Labs  Lab 07/10/19 0659 07/12/19 0501 07/13/19 0516 07/14/19 0724  WBC 8.6 5.8 8.6 9.1  NEUTROABS 5.6  --   --   --   HGB 13.6 13.3 13.6 14.4  HCT 39.0 39.8 40.1 42.6  MCV 93.8 97.3 96.4 96.6  PLT 81* 97* 136* 186   Cardiac Enzymes: No results for  input(s): CKTOTAL, CKMB, CKMBINDEX, TROPONINI in the last 168 hours. BNP: BNP (last 3 results) No results for input(s): BNP in the last 8760 hours.  ProBNP (last 3 results) No results for input(s): PROBNP in the last 8760 hours.  CBG: No results for input(s): GLUCAP in the last 168 hours.     Signed:  Cristal Ford  [facility] 07/16/2019, 9:11 AM

## 2019-07-16 NOTE — Discharge Instructions (Signed)
Sndrome de abstinencia alcohlica Alcohol Withdrawal Syndrome Cuando una persona que consume mucho alcohol deja de beber, puede tener sntomas desagradables y graves. El conjunto de esos sntomas se denomina sndrome de abstinencia alcohlica. Esta afeccin puede ser leve o grave. Puede ser potencialmente mortal. Puede ocasionar:  Temblores que no IT consultant.  Sudoracin.  Dolor de [place].  Sensacin de temor, Tree surgeon, malhumor o depresin.  Problemas para dormir (insomnio).  Pesadillas.  Latidos cardacos acelerados o irregulares (palpitaciones).  Sentir una necesidad imperiosa de beber alcohol.  Malestar estomacal (nuseas).  Ganas de devolver (vmitos).  Sentirse alterado por las luces y los sonidos.  Confusin.  Dificultad para pensar claramente.  Falta de hambre (inapetencia).  Cambios drsticos de humor (cambios en el estado de nimo). Si tiene todos estos sntomas al [employer], busque ayuda de inmediato:  Presin arterial alta.  Latidos cardacos acelerados.  Dificultad para respirar.  Convulsiones.  Ver, or, sentir, Restaurant manager, fast food o Environmental consultant de cosas que no existen (alucinaciones). Estos sntomas se conocen como delirium tremens (DT). Deben tratarse en el hospital de inmediato. Siga estas indicaciones en su casa:   Tome los medicamentos de venta libre y los recetados solamente como se lo haya indicado el mdico. Incluya tambin las vitaminas.  No beba alcohol.  No conduzca hasta que el mdico le diga que puede [place].  Pdale a alguna persona que se quede con usted o que est disponible por si necesita ayuda. Debe ser Ardelia Mems persona de su confianza. Esa persona puede ayudarlo con sus sntomas. Tambin puede ayudarlo a no beber.  Beba suficiente lquido para Consulting civil engineer orina de color amarillo plido.  Considere la posibilidad de unirse a un grupo de apoyo o un programa de tratamiento que lo ayude a Personal assistant de beber.  Concurra a todas las  visitas de seguimiento como se lo haya indicado el mdico. Esto es importante. Comunquese con un mdico si:  Los sntomas empeoran.  No puede comer ni beber lquidos sin vomitar.  Tiene mucha dificultad para dejar de beber alcohol.  No puede dejar de beber alcohol. Solicite ayuda de inmediato si:  Tiene latidos cardacos acelerados o irregulares.  Siente dolor en el pecho.  Tiene dificultad para respirar.  Tiene una convulsin por primera vez.  Ve, oye, siente, huele o percibe el sabor de cosas que no existen.  Se siente muy confundido. Resumen  Cuando una persona que consume mucho alcohol deja de beber, puede tener sntomas graves. Esto se llama sndrome de abstinencia alcohlica.  El delirium tremens (DT) es un conjunto de sntomas potencialmente mortales. Obtenga ayuda de inmediato si tiene esos sntomas.  Piense en la posibilidad de unirse a un programa de tratamiento o a un grupo de apoyo para Artist. Esta informacin no tiene Marine scientist el consejo del mdico. Asegrese de hacerle al mdico cualquier pregunta que tenga. Document Released: 08/23/2010 Document Revised: 06/15/2017 Document Reviewed: 06/15/2017 Elsevier Patient Education  2020 [employer].

## 2019-07-17 MED ORDER — POTASSIUM CHLORIDE CRYS ER 20 MEQ PO TBCR
80.0000 meq | EXTENDED_RELEASE_TABLET | Freq: Once | ORAL | Status: AC
  Administered 2019-07-17: 80 meq via ORAL
  Filled 2019-07-17: qty 4

## 2019-07-17 MED ORDER — POTASSIUM CHLORIDE CRYS ER 20 MEQ PO TBCR: 40.0000 meq | EXTENDED_RELEASE_TABLET | Freq: Once | ORAL | Status: DC

## 2019-07-17 NOTE — Progress Notes (Signed)
Patient was discharged on 12/12, however there were issues and he could not return to his place of residence. TOC consulted. Patient still stable for discharge. No change in orders or meds.

## 2019-07-17 NOTE — Progress Notes (Signed)
Pt  Discharged to private vehicle/Nephew. AVS reviewed with patient and follow up appointments. Belongings just his clothes. Vital signs stable. No agitation/anxiety. Gait stable. Simmie Davies RN

## 2019-09-23 ENCOUNTER — Emergency Department (HOSPITAL_COMMUNITY): Payer: Self-pay

## 2019-09-23 ENCOUNTER — Inpatient Hospital Stay (HOSPITAL_COMMUNITY)
Admission: EM | Admit: 2019-09-23 | Discharge: 2019-09-29 | DRG: 896 | Disposition: A | Discharge status: Home / Self Care | Payer: Self-pay | Attending: Internal Medicine | Admitting: Internal Medicine

## 2019-09-23 DIAGNOSIS — R7989 Other specified abnormal findings of blood chemistry: Secondary | ICD-10-CM | POA: Diagnosis present

## 2019-09-23 DIAGNOSIS — E860 Dehydration: Secondary | ICD-10-CM | POA: Diagnosis present

## 2019-09-23 DIAGNOSIS — F10930 Alcohol use, unspecified with withdrawal, uncomplicated: Secondary | ICD-10-CM

## 2019-09-23 DIAGNOSIS — F17210 Nicotine dependence, cigarettes, uncomplicated: Secondary | ICD-10-CM | POA: Diagnosis present

## 2019-09-23 DIAGNOSIS — E872 Acidosis: Secondary | ICD-10-CM | POA: Diagnosis present

## 2019-09-23 DIAGNOSIS — I4891 Unspecified atrial fibrillation: Secondary | ICD-10-CM

## 2019-09-23 DIAGNOSIS — D696 Thrombocytopenia, unspecified: Secondary | ICD-10-CM | POA: Diagnosis present

## 2019-09-23 DIAGNOSIS — F10229 Alcohol dependence with intoxication, unspecified: Secondary | ICD-10-CM | POA: Diagnosis present

## 2019-09-23 DIAGNOSIS — K7010 Alcoholic hepatitis without ascites: Secondary | ICD-10-CM | POA: Diagnosis present

## 2019-09-23 DIAGNOSIS — I420 Dilated cardiomyopathy: Secondary | ICD-10-CM

## 2019-09-23 DIAGNOSIS — F10931 Alcohol use, unspecified with withdrawal delirium: Secondary | ICD-10-CM | POA: Diagnosis present

## 2019-09-23 DIAGNOSIS — I110 Hypertensive heart disease with heart failure: Secondary | ICD-10-CM | POA: Diagnosis present

## 2019-09-23 DIAGNOSIS — F10230 Alcohol dependence with withdrawal, uncomplicated: Secondary | ICD-10-CM

## 2019-09-23 DIAGNOSIS — D6959 Other secondary thrombocytopenia: Secondary | ICD-10-CM | POA: Diagnosis present

## 2019-09-23 DIAGNOSIS — R569 Unspecified convulsions: Secondary | ICD-10-CM | POA: Diagnosis present

## 2019-09-23 DIAGNOSIS — I5021 Acute systolic (congestive) heart failure: Secondary | ICD-10-CM | POA: Diagnosis present

## 2019-09-23 DIAGNOSIS — Y901 Blood alcohol level of 20-39 mg/100 ml: Secondary | ICD-10-CM

## 2019-09-23 DIAGNOSIS — I480 Paroxysmal atrial fibrillation: Secondary | ICD-10-CM | POA: Diagnosis present

## 2019-09-23 DIAGNOSIS — Z20822 Contact with and (suspected) exposure to covid-19: Secondary | ICD-10-CM | POA: Diagnosis present

## 2019-09-23 DIAGNOSIS — E7220 Disorder of urea cycle metabolism, unspecified: Secondary | ICD-10-CM | POA: Diagnosis not present

## 2019-09-23 DIAGNOSIS — F10231 Alcohol dependence with withdrawal delirium: Principal | ICD-10-CM | POA: Diagnosis present

## 2019-09-23 DIAGNOSIS — I169 Hypertensive crisis, unspecified: Secondary | ICD-10-CM

## 2019-09-23 DIAGNOSIS — E876 Hypokalemia: Secondary | ICD-10-CM | POA: Diagnosis present

## 2019-09-23 HISTORY — DX: Essential (primary) hypertension: I10

## 2019-09-23 LAB — URINALYSIS, ROUTINE W REFLEX MICROSCOPIC
Bilirubin Urine: NEGATIVE
Glucose, UA: 50 mg/dL — AB
Ketones, ur: 20 mg/dL — AB
Leukocytes,Ua: NEGATIVE
Nitrite: NEGATIVE
Protein, ur: >=300 mg/dL — AB
Specific Gravity, Urine: 1.012 (ref 1.005–1.030)
pH: 7 (ref 5.0–8.0)

## 2019-09-23 LAB — PROTIME-INR
INR: 1 (ref 0.8–1.2)
Prothrombin Time: 13.4 seconds (ref 11.4–15.2)

## 2019-09-23 LAB — TROPONIN I (HIGH SENSITIVITY): Troponin I (High Sensitivity): 11 ng/L (ref <18)

## 2019-09-23 LAB — CBC WITH DIFFERENTIAL/PLATELET
Abs Immature Granulocytes: 0.05 10*3/uL (ref 0.00–0.07)
Basophils Absolute: 0.1 10*3/uL (ref 0.0–0.1)
Basophils Relative: 1 %
Eosinophils Absolute: 0.1 10*3/uL (ref 0.0–0.5)
Eosinophils Relative: 1 %
HCT: 43.3 % (ref 39.0–52.0)
Hemoglobin: 14.5 g/dL (ref 13.0–17.0)
Immature Granulocytes: 1 %
Lymphocytes Relative: 7 %
Lymphs Abs: 0.8 10*3/uL (ref 0.7–4.0)
MCH: 32.5 pg (ref 26.0–34.0)
MCHC: 33.5 g/dL (ref 30.0–36.0)
MCV: 97.1 fL (ref 80.0–100.0)
Monocytes Absolute: 0.8 10*3/uL (ref 0.1–1.0)
Monocytes Relative: 7 %
Neutro Abs: 8.7 10*3/uL — ABNORMAL HIGH (ref 1.7–7.7)
Neutrophils Relative %: 83 %
Platelets: 90 10*3/uL — ABNORMAL LOW (ref 150–400)
RBC: 4.46 MIL/uL (ref 4.22–5.81)
RDW: 13.2 % (ref 11.5–15.5)
WBC: 10.5 10*3/uL (ref 4.0–10.5)
nRBC: 0 % (ref 0.0–0.2)

## 2019-09-23 LAB — AMMONIA: Ammonia: 84 umol/L — ABNORMAL HIGH (ref 9–35)

## 2019-09-23 LAB — COMPREHENSIVE METABOLIC PANEL
ALT: 98 U/L — ABNORMAL HIGH (ref 0–44)
ALT: 84 U/L — ABNORMAL HIGH (ref 0–44)
AST: 187 U/L — ABNORMAL HIGH (ref 15–41)
AST: 134 U/L — ABNORMAL HIGH (ref 15–41)
Albumin: 3.3 g/dL — ABNORMAL LOW (ref 3.5–5.0)
Albumin: 3 g/dL — ABNORMAL LOW (ref 3.5–5.0)
Alkaline Phosphatase: 87 U/L (ref 38–126)
Alkaline Phosphatase: 89 U/L (ref 38–126)
Anion gap: 22 — ABNORMAL HIGH (ref 5–15)
Anion gap: 12 (ref 5–15)
BUN: <5 mg/dL — ABNORMAL LOW (ref 6–20)
BUN: <5 mg/dL — ABNORMAL LOW (ref 6–20)
CO2: 17 mmol/L — ABNORMAL LOW (ref 22–32)
CO2: 23 mmol/L (ref 22–32)
Calcium: 8.9 mg/dL (ref 8.9–10.3)
Calcium: 8.2 mg/dL — ABNORMAL LOW (ref 8.9–10.3)
Chloride: 99 mmol/L (ref 98–111)
Chloride: 99 mmol/L (ref 98–111)
Creatinine, Ser: 0.75 mg/dL (ref 0.61–1.24)
Creatinine, Ser: 0.53 mg/dL — ABNORMAL LOW (ref 0.61–1.24)
GFR calc Af Amer: >60 mL/min (ref >60)
GFR calc Af Amer: >60 mL/min (ref >60)
GFR calc non Af Amer: >60 mL/min (ref >60)
GFR calc non Af Amer: >60 mL/min (ref >60)
Glucose, Bld: 160 mg/dL — ABNORMAL HIGH (ref 70–99)
Glucose, Bld: 115 mg/dL — ABNORMAL HIGH (ref 70–99)
Potassium: 3.7 mmol/L (ref 3.5–5.1)
Potassium: 3.6 mmol/L (ref 3.5–5.1)
Sodium: 138 mmol/L (ref 135–145)
Sodium: 134 mmol/L — ABNORMAL LOW (ref 135–145)
Total Bilirubin: 1.2 mg/dL (ref 0.3–1.2)
Total Bilirubin: 1.2 mg/dL (ref 0.3–1.2)
Total Protein: 7.7 g/dL (ref 6.5–8.1)
Total Protein: 6.8 g/dL (ref 6.5–8.1)

## 2019-09-23 LAB — CBG MONITORING, ED: Glucose-Capillary: 103 mg/dL — ABNORMAL HIGH (ref 70–99)

## 2019-09-23 LAB — RAPID URINE DRUG SCREEN, HOSP PERFORMED
Amphetamines: NOT DETECTED
Barbiturates: NOT DETECTED
Benzodiazepines: NOT DETECTED
Cocaine: NOT DETECTED
Opiates: NOT DETECTED
Tetrahydrocannabinol: NOT DETECTED

## 2019-09-23 LAB — CK: Total CK: 621 U/L — ABNORMAL HIGH (ref 49–397)

## 2019-09-23 LAB — TSH: TSH: 1.778 u[IU]/mL (ref 0.350–4.500)

## 2019-09-23 LAB — LACTIC ACID, PLASMA
Lactic Acid, Venous: 0.9 mmol/L (ref 0.5–1.9)
Lactic Acid, Venous: 1.1 mmol/L (ref 0.5–1.9)

## 2019-09-23 LAB — GLUCOSE, CAPILLARY: Glucose-Capillary: 101 mg/dL — ABNORMAL HIGH (ref 70–99)

## 2019-09-23 LAB — PHOSPHORUS: Phosphorus: 2.7 mg/dL (ref 2.5–4.6)

## 2019-09-23 LAB — RESPIRATORY PANEL BY RT PCR (FLU A&B, COVID)
Influenza A by PCR: NEGATIVE
Influenza B by PCR: NEGATIVE
SARS Coronavirus 2 by RT PCR: NEGATIVE

## 2019-09-23 LAB — MAGNESIUM
Magnesium: 1.5 mg/dL — ABNORMAL LOW (ref 1.7–2.4)
Magnesium: 2.2 mg/dL (ref 1.7–2.4)

## 2019-09-23 LAB — BETA-HYDROXYBUTYRIC ACID: Beta-Hydroxybutyric Acid: 0.32 mmol/L — ABNORMAL HIGH (ref 0.05–0.27)

## 2019-09-23 LAB — ETHANOL: Alcohol, Ethyl (B): 29 mg/dL — ABNORMAL HIGH (ref <10)

## 2019-09-23 MED ORDER — LORAZEPAM 2 MG/ML IJ SOLN: 1.0000 mg | INTRAMUSCULAR | Status: DC | PRN

## 2019-09-23 MED ORDER — FOLIC ACID 1 MG PO TABS
1.0000 mg | ORAL_TABLET | Freq: Every day | ORAL | Status: DC
  Administered 2019-09-23 – 2019-09-27 (×5): 1 mg via ORAL
  Filled 2019-09-23 (×5): qty 1

## 2019-09-23 MED ORDER — ADULT MULTIVITAMIN W/MINERALS CH: 1.0000 | ORAL_TABLET | Freq: Every day | ORAL | Status: DC

## 2019-09-23 MED ORDER — QUETIAPINE FUMARATE 25 MG PO TABS
25.0000 mg | ORAL_TABLET | Freq: Two times a day (BID) | ORAL | Status: DC
  Administered 2019-09-23: 25 mg via ORAL
  Filled 2019-09-23: qty 1

## 2019-09-23 MED ORDER — THIAMINE HCL 100 MG PO TABS: 100.0000 mg | ORAL_TABLET | Freq: Every day | ORAL | Status: DC

## 2019-09-23 MED ORDER — LACTATED RINGERS IV SOLN: INTRAVENOUS | Status: DC

## 2019-09-23 MED ORDER — DILTIAZEM LOAD VIA INFUSION
10.0000 mg | Freq: Once | INTRAVENOUS | Status: AC
  Administered 2019-09-23: 10 mg via INTRAVENOUS
  Filled 2019-09-23: qty 10

## 2019-09-23 MED ORDER — METOPROLOL TARTRATE 25 MG PO TABS
25.0000 mg | ORAL_TABLET | Freq: Two times a day (BID) | ORAL | Status: DC
  Administered 2019-09-23 – 2019-09-24 (×2): 25 mg via ORAL
  Filled 2019-09-23: qty 1
  Filled 2019-09-23: qty 2

## 2019-09-23 MED ORDER — PHENOBARBITAL SODIUM 130 MG/ML IJ SOLN
260.0000 mg | Freq: Once | INTRAMUSCULAR | Status: AC
  Administered 2019-09-23: 260 mg via INTRAVENOUS
  Filled 2019-09-23: qty 2

## 2019-09-23 MED ORDER — LORAZEPAM 2 MG/ML IJ SOLN
2.0000 mg | Freq: Once | INTRAMUSCULAR | Status: AC
  Administered 2019-09-23: 2 mg via INTRAVENOUS
  Filled 2019-09-23: qty 1

## 2019-09-23 MED ORDER — CLONIDINE HCL 0.1 MG PO TABS
0.1000 mg | ORAL_TABLET | Freq: Three times a day (TID) | ORAL | Status: AC
  Administered 2019-09-23 – 2019-09-25 (×6): 0.1 mg via ORAL
  Filled 2019-09-23 (×6): qty 1

## 2019-09-23 MED ORDER — LACTULOSE 10 GM/15ML PO SOLN: 20.0000 g | Freq: Two times a day (BID) | ORAL | Status: DC

## 2019-09-23 MED ORDER — CHLORDIAZEPOXIDE HCL 10 MG PO CAPS: 10.0000 mg | ORAL_CAPSULE | Freq: Three times a day (TID) | ORAL | Status: DC

## 2019-09-23 MED ORDER — PHENOBARBITAL 32.4 MG PO TABS
64.8000 mg | ORAL_TABLET | Freq: Three times a day (TID) | ORAL | Status: DC
  Filled 2019-09-23: qty 2

## 2019-09-23 MED ORDER — PHENOBARBITAL 32.4 MG PO TABS: 32.4000 mg | ORAL_TABLET | Freq: Three times a day (TID) | ORAL | Status: DC

## 2019-09-23 MED ORDER — LACTULOSE 10 GM/15ML PO SOLN
20.0000 g | Freq: Three times a day (TID) | ORAL | Status: DC
  Administered 2019-09-23 (×2): 20 g via ORAL
  Filled 2019-09-23 (×4): qty 30

## 2019-09-23 MED ORDER — MAGNESIUM SULFATE 4 GM/100ML IV SOLN
4.0000 g | Freq: Once | INTRAVENOUS | Status: AC
  Administered 2019-09-23: 4 g via INTRAVENOUS
  Filled 2019-09-23: qty 100

## 2019-09-23 MED ORDER — ONDANSETRON HCL 4 MG/2ML IJ SOLN: 4.0000 mg | Freq: Four times a day (QID) | INTRAMUSCULAR | Status: DC | PRN

## 2019-09-23 MED ORDER — PHENOBARBITAL 16.2 MG PO TABS: 16.2000 mg | ORAL_TABLET | Freq: Three times a day (TID) | ORAL | Status: DC

## 2019-09-23 MED ORDER — THIAMINE HCL 100 MG/ML IJ SOLN
100.0000 mg | Freq: Every day | INTRAMUSCULAR | Status: DC
  Administered 2019-09-23 – 2019-09-27 (×5): 100 mg via INTRAVENOUS
  Filled 2019-09-23 (×5): qty 2

## 2019-09-23 MED ORDER — LORAZEPAM 2 MG/ML IJ SOLN
1.0000 mg | Freq: Once | INTRAMUSCULAR | Status: DC
  Filled 2019-09-23: qty 1

## 2019-09-23 MED ORDER — CHLORDIAZEPOXIDE HCL 5 MG PO CAPS: 5.0000 mg | ORAL_CAPSULE | Freq: Three times a day (TID) | ORAL | Status: DC

## 2019-09-23 MED ORDER — FOLIC ACID 1 MG PO TABS: 1.0000 mg | ORAL_TABLET | Freq: Every day | ORAL | Status: DC

## 2019-09-23 MED ORDER — PHENOBARBITAL 97.2 MG PO TABS
97.2000 mg | ORAL_TABLET | Freq: Three times a day (TID) | ORAL | Status: AC
  Administered 2019-09-23 – 2019-09-25 (×6): 97.2 mg via ORAL
  Filled 2019-09-23 (×7): qty 1

## 2019-09-23 MED ORDER — LORAZEPAM 2 MG/ML IJ SOLN
2.0000 mg | Freq: Once | INTRAMUSCULAR | Status: AC
  Administered 2019-09-23: 2 mg via INTRAVENOUS

## 2019-09-23 MED ORDER — PANTOPRAZOLE SODIUM 40 MG PO TBEC
40.0000 mg | DELAYED_RELEASE_TABLET | Freq: Every day | ORAL | Status: DC
  Administered 2019-09-23 – 2019-09-24 (×2): 40 mg via ORAL
  Filled 2019-09-23 (×3): qty 1

## 2019-09-23 MED ORDER — DILTIAZEM HCL-DEXTROSE 125-5 MG/125ML-% IV SOLN (PREMIX)
5.0000 mg/h | INTRAVENOUS | Status: DC
  Administered 2019-09-23: 5 mg/h via INTRAVENOUS
  Filled 2019-09-23 (×2): qty 125

## 2019-09-23 MED ORDER — LORAZEPAM 1 MG PO TABS
1.0000 mg | ORAL_TABLET | ORAL | Status: AC | PRN
  Administered 2019-09-23: 2 mg via ORAL
  Administered 2019-09-23: 3 mg via ORAL
  Administered 2019-09-24: 2 mg via ORAL
  Filled 2019-09-23 (×3): qty 2
  Filled 2019-09-23: qty 1

## 2019-09-23 MED ORDER — CHLORDIAZEPOXIDE HCL 25 MG PO CAPS: 25.0000 mg | ORAL_CAPSULE | Freq: Three times a day (TID) | ORAL | Status: DC

## 2019-09-23 MED ORDER — ADULT MULTIVITAMIN W/MINERALS CH
1.0000 | ORAL_TABLET | Freq: Every day | ORAL | Status: DC
  Administered 2019-09-23 – 2019-09-27 (×5): 1 via ORAL
  Filled 2019-09-23 (×5): qty 1

## 2019-09-23 NOTE — Progress Notes (Signed)
eLink Physician-Brief Progress Note Patient Name: Vincent Peters DOB: 08/25/1980 MRN: [id number]   Date of Service  09/23/2019  HPI/Events of Note  Pt  Is a 39 yo M alcoholic who was admitted for ETOH withdrawal, DT's and Afib with RVR, Pt is on a Diltiazem infusion for Afib and appropriate medications for ETOH withdrawal.  eICU Interventions  New Patient Evaluation completed.        Migdalia Dk 09/23/2019, 9:37 PM

## 2019-09-23 NOTE — ED Notes (Addendum)
[phone]-  Brother in law Felicianio    - Would like a update on pt.

## 2019-09-23 NOTE — H&P (Signed)
NAMEMackie Peters, MRN:  [id number], DOB:  16-Sep-1980, LOS: 0 ADMISSION DATE:  09/23/2019, CONSULTATION DATE:  2/19 REFERRING MD:  Vincent Peters, CHIEF COMPLAINT:  Acute delirium presumed w/d & AF w/ RVR  Brief History   39 year old male heavy history of EtOH, last drink reported on 2/18.  Admitted 2/19 tremulous, slightly restless, and in narrow-complex tachycardia.  Critical care asked to admit  History of present illness   39 year old male patient with a history of alcoholism and prior withdrawal with delirium tremens.  Presented to the emergency room on 2/19.  Ported that his last drink was on 2/18 and he was starting to develop tremors, nausea vomiting and diaphoresis because of this he felt he was again going through withdrawal and he called EMS.  On EMS arrival he was found to be in a narrow-complex tachycardia with heart rate 300 reported by EMS.  He was given 6 mg of adenosine, then 12 mg, the two boluses of diltiazem.  He did briefly return his heart rate to 82, but then once again returned to rate of 150s to 160s.  As stated he was tremulous on arrival, shared with the emergency room physician previously he has needed to be "tied down".  He received a total of 4 mg of lorazepam with little improvement in his symptoms and ongoing tachycardia and because of this the critical care team was asked to admit particularly because he is required Precedex infusion in the past to stabilize his delirium.   Past Medical History  Alcohol abuse, prior seizure in the setting of withdrawal, accelerated hypertension, prior alcoholic hepatitis with hyperammonemia, thrombocytopenia, prior tachycardia  Significant Hospital Events   2/19 admitted with working diagnosis of DTs and narrow-complex tachycardia  Consults:    Procedures:    Significant Diagnostic Tests:  Ammonia level 2/19:  84 UDS 2/19: + benzos Ethanol 2/19: 29, Mg 1.5, PLT 90, + anion gap metabolic acidosis  Micro Data:      Antimicrobials:    Interim history/subjective:    Objective   Blood pressure (Abnormal) 163/93, pulse (Abnormal) 45, temperature 98.7 F (37.1 C), temperature source Oral, resp. rate 20, SpO2 100 %.       No intake or output data in the 24 hours ending 09/23/19 1533 There were no vitals filed for this visit.  Examination: General: adult male resting in bed HENT: [place]/AT, PERRL, no JVD Lungs: Clear Cardiovascular: Rapid rate, IRIR, no MRG Abdomen: Soft, non-tender, non-distended Extremities: No acute deformity or ROM limitation Neuro: Alert, oriented, non-focal  Resolved Hospital Problem list     Assessment & Plan:   Alcohol withdrawal: He has a known history of alcohol and cocaine abuse. History of ETOH withdrawal seizures and ICU admissions for Precedex. He has tremors classic for ETOH and tells staff he is withdrawing.  - Admit to ICU - Phenobarbital load - Will attempt to manage him with Ativan CIWA for now, it is possible he may require Precedex infusion in the coming hours/days.  - Thiamine and folate - Multivitamin - Give magnesium - Seizure precautions.   Atrial fibrillation with RVR: rate initially in 300s. Given adenosine x 2, found to be AF and started on diltiazem infusion w/ bolus. Suspect this is driven by dehydration, withdrawal. As far as I can tell he does not have a history of this. CHADSVASC 1. No need to start [facility] at this time.  - Telemetry monitoring - Diltiazem infusion - IVF hydration  Hypertensive crisis: improved - continue diltiazem infusion -  ICU hemodynamic monitoring - Holding home amlodipine for now  Anion gap acidosis: likely alcoholic ketoacidosis. Glucose is mildly elevated, doubt DKA. Urine ketones positive. Lactic acidosis possible given tremors and tachypnea.  - check lactic - check beta-hydroxybutyric acid - Trend BMP - IVF  Transaminitis: AST 187 and ALT 98 on admission. Ammonia 84. Total bilirubin 1.2. RUQ ultrasound in  07/2019 did not demonstrate cirrhosis.  - Follow LFT - Lactulose  Best practice:  Diet: NPO Pain/Anxiety/Delirium protocol (if indicated): CIWA protocol  VAP protocol (if indicated): NA DVT prophylaxis: SCD GI prophylaxis: PPI Glucose control: SSI Mobility: BR w/ seizure precautions  Code Status: Full Code  Family Communication: Patient updated Disposition: to ICU  Labs   CBC: Recent Labs  Lab 09/23/19 1414  WBC 10.5  NEUTROABS 8.7*  HGB 14.5  HCT 43.3  MCV 97.1  PLT 90*    Basic Metabolic Panel: Recent Labs  Lab 09/23/19 1414  NA 138  K 3.7  CL 99  CO2 17*  GLUCOSE 160*  BUN <5*  CREATININE 0.75  CALCIUM 8.9  MG 1.5*   GFR: CrCl cannot be calculated (Unknown ideal weight.). Recent Labs  Lab 09/23/19 1414  WBC 10.5    Liver Function Tests: Recent Labs  Lab 09/23/19 1414  AST 187*  ALT 98*  ALKPHOS 87  BILITOT 1.2  PROT 7.7  ALBUMIN 3.3*   No results for input(s): LIPASE, AMYLASE in the last 168 hours. Recent Labs  Lab 09/23/19 1414  AMMONIA 84*    ABG No results found for: PHART, PCO2ART, PO2ART, HCO3, TCO2, ACIDBASEDEF, O2SAT   Coagulation Profile: No results for input(s): INR, PROTIME in the last 168 hours.  Cardiac Enzymes: No results for input(s): CKTOTAL, CKMB, CKMBINDEX, TROPONINI in the last 168 hours.  HbA1C: Hgb A1c MFr Bld  Date/Time Value Ref Range Status  04/28/2019 02:01 AM 5.4 4.8 - 5.6 % Final    Comment:    (NOTE)         Prediabetes: 5.7 - 6.4         Diabetes: >6.4         Glycemic control for adults with diabetes: <7.0     CBG: No results for input(s): GLUCAP in the last 168 hours.  Review of Systems:   Bolds are positive  Constitutional: weight loss, gain, night sweats, Fevers, chills, fatigue .  HEENT: headaches, Sore throat, sneezing, nasal congestion, post nasal drip, Difficulty swallowing, Tooth/dental problems, visual complaints visual changes, ear ache CV:  chest pain, radiates:,Orthopnea, PND,  swelling in lower extremities, dizziness, palpitations, syncope.  GI  heartburn, indigestion, abdominal pain, nausea, vomiting, diarrhea, change in bowel habits, loss of appetite, bloody stools.  Resp: cough, productive: , hemoptysis, dyspnea, chest pain, pleuritic.  Skin: rash or itching or icterus GU: dysuria, change in color of urine, urgency or frequency. flank pain, hematuria  MS: joint pain or swelling. decreased range of motion  Psych: change in mood or affect. depression or anxiety.  Neuro: difficulty with speech, weakness, numbness, ataxia    Past Medical History  He,  has a past medical history of Alcohol abuse and Cocaine abuse (HCC).   Surgical History   No past surgical history on file.   Social History   reports that he has been smoking cigarettes. He has never used smokeless tobacco. He reports current alcohol use of about 168.0 standard drinks of alcohol per week. He reports current drug use. Drug: Cocaine.   Family History   His family history  is not on file.   Allergies No Known Allergies   Home Medications  Prior to Admission medications   Medication Sig Start Date End Date Taking? Authorizing Provider  amLODipine (NORVASC) 5 MG tablet Take 1 tablet (5 mg total) by mouth daily. 07/17/19   Mikhail, Velta Addison, DO  folic acid (FOLVITE) 1 MG tablet Take 1 tablet (1 mg total) by mouth daily. 07/17/19   Mikhail, Velta Addison, DO  lactulose (CHRONULAC) 10 GM/15ML solution Take 30 mLs (20 g total) by mouth 2 (two) times daily. 07/16/19   Cristal Ford, DO  Multiple Vitamin (MULTIVITAMIN WITH MINERALS) TABS tablet Take 1 tablet by mouth daily. 07/17/19   Mikhail, Velta Addison, DO  pantoprazole (PROTONIX) 40 MG tablet Take 1 tablet (40 mg total) by mouth at bedtime. 07/16/19   Cristal Ford, DO  thiamine 100 MG tablet Take 1 tablet (100 mg total) by mouth daily. 07/17/19   Cristal Ford, DO     Critical care time: 55 mins     Georgann Housekeeper, AGACNP-BC [place] for personal pager PCCM on call pager [phone]  09/23/2019 4:14 PM

## 2019-09-23 NOTE — ED Provider Notes (Signed)
[place] EMERGENCY DEPARTMENT Provider Note   CSN: [id number] Arrival date & time: 09/23/19  1410     History Chief Complaint  Patient presents with  . Alcohol Intoxication  . Tachycardia  . Seizures    Vincent Peters is a 39 y.o. male.  HPI Patient presented by EMS.  Questionable seizure activity.  Found a heart rate of 300 upon arrival.  Have been given adenosine twice and then Cardizem.  Patient states he is withdrawing from alcohol and may need to be tied down because of it.  States he drinks heavily.  Has had DT admissions in the past.  No reported history of atrial fibrillation or arrhythmia.  Language interpreter used.    .   Past Medical History:  Diagnosis Date  . Alcohol abuse   . Cocaine abuse [facility])     Patient Active Problem List   Diagnosis Date Noted  . Alcohol withdrawal delirium, acute, hyperactive ([place]) 09/23/2019  . Elevated LFTs   . Withdrawal symptoms, alcohol ([place]) 07/06/2019  . Alcohol abuse with alcohol-induced mood disorder ([place]) 02/26/2018  . Alcohol withdrawal ([place]) 02/10/2014  . Hypokalemia 02/10/2014  . Thrombocytopenia ([place]) 02/10/2014  . Hypercalcemia 02/10/2014  . Delirium tremens ([place]) 02/10/2014    No past surgical history on file.     No family history on file.  Social History   Tobacco Use  . Smoking status: Light Tobacco Smoker    Types: Cigarettes  . Smokeless tobacco: Never Used  Substance Use Topics  . Alcohol use: Yes    Alcohol/week: 168.0 standard drinks    Types: 168 Cans of beer per week  . Drug use: Yes    Types: Cocaine    Home Medications Prior to Admission medications   Medication Sig Start Date End Date Taking? Authorizing Provider  amLODipine (NORVASC) 5 MG tablet Take 1 tablet (5 mg total) by mouth daily. 07/17/19   Mikhail, Velta Addison, DO  folic acid (FOLVITE) 1 MG tablet Take 1 tablet (1 mg total) by mouth daily. 07/17/19   Mikhail, Velta Addison, DO  lactulose  (CHRONULAC) 10 GM/15ML solution Take 30 mLs (20 g total) by mouth 2 (two) times daily. 07/16/19   Cristal Ford, DO  Multiple Vitamin (MULTIVITAMIN WITH MINERALS) TABS tablet Take 1 tablet by mouth daily. 07/17/19   Mikhail, Velta Addison, DO  pantoprazole (PROTONIX) 40 MG tablet Take 1 tablet (40 mg total) by mouth at bedtime. 07/16/19   Cristal Ford, DO  thiamine 100 MG tablet Take 1 tablet (100 mg total) by mouth daily. 07/17/19   Cristal Ford, DO    Allergies    Patient has no known allergies.  Review of Systems   Review of Systems  Constitutional: Negative for appetite change.  HENT: Negative for congestion.   Respiratory: Positive for shortness of breath.   Gastrointestinal: Negative for abdominal pain.  Genitourinary: Negative for flank pain.  Skin: Negative for wound.  Neurological: Positive for syncope and weakness. Negative for speech difficulty.    Physical Exam Updated Vital Signs BP 127/88   Pulse (!) 110   Temp 98.7 F (37.1 C) (Oral)   Resp (!) 22   SpO2 98%   Physical Exam Vitals and nursing note reviewed.  HENT:     Head: Atraumatic.     Comments: Face is somewhat flushed Cardiovascular:     Rate and Rhythm: Tachycardia present. Rhythm irregular.  Pulmonary:     Breath sounds: No wheezing or rhonchi.  Abdominal:     Tenderness:  There is no abdominal tenderness.  Musculoskeletal:     Right lower leg: Edema present.     Left lower leg: Edema present.  Skin:    General: Skin is warm.  Neurological:     Mental Status: He is alert.     Comments: Tremulous in bilateral upper extremities.  Somewhat difficulty grabbing things.  Was unable to drink a glass of water.     ED Results / Procedures / Treatments   Labs (all labs ordered are listed, but only abnormal results are displayed) Labs Reviewed  COMPREHENSIVE METABOLIC PANEL - Abnormal; Notable for the following components:      Result Value   CO2 17 (*)    Glucose, Bld 160 (*)    BUN <5 (*)      Albumin 3.3 (*)    AST 187 (*)    ALT 98 (*)    Anion gap 22 (*)    All other components within normal limits  CBC WITH DIFFERENTIAL/PLATELET - Abnormal; Notable for the following components:   Platelets 90 (*)    Neutro Abs 8.7 (*)    All other components within normal limits  AMMONIA - Abnormal; Notable for the following components:   Ammonia 84 (*)    All other components within normal limits  MAGNESIUM - Abnormal; Notable for the following components:   Magnesium 1.5 (*)    All other components within normal limits  ETHANOL - Abnormal; Notable for the following components:   Alcohol, Ethyl (B) 29 (*)    All other components within normal limits  URINALYSIS, ROUTINE W REFLEX MICROSCOPIC - Abnormal; Notable for the following components:   Glucose, UA 50 (*)    Hgb urine dipstick MODERATE (*)    Ketones, ur 20 (*)    Protein, ur >=300 (*)    Bacteria, UA RARE (*)    All other components within normal limits  RESPIRATORY PANEL BY RT PCR (FLU A&B, COVID)  TSH  RAPID URINE DRUG SCREEN, HOSP PERFORMED  COMPREHENSIVE METABOLIC PANEL  PHOSPHORUS  LACTIC ACID, PLASMA  LACTIC ACID, PLASMA  BETA-HYDROXYBUTYRIC ACID  BETA-HYDROXYBUTYRIC ACID  BETA-HYDROXYBUTYRIC ACID  TROPONIN I (HIGH SENSITIVITY)    EKG EKG Interpretation  Date/Time:  Friday September 23 2019 14:14:54 EST Ventricular Rate:  184 PR Interval:    QRS Duration: 83 QT Interval:  239 QTC Calculation: 419 R Axis:   75 Text Interpretation: Atrial fibrillation with rapid V-rate Repolarization abnormality, prob rate related Confirmed by Benjiman Core [phone]) on 09/23/2019 3:15:11 PM   Radiology DG Chest Portable 1 View  Result Date: 09/23/2019 CLINICAL DATA:  Shortness of breath with tremors, nausea, vomiting and diaphoresis. EXAM: PORTABLE CHEST 1 VIEW COMPARISON:  04/25/2019 FINDINGS: Lungs are somewhat hypoinflated but otherwise clear. Cardiomediastinal silhouette and remainder of the exam is unchanged.  IMPRESSION: No active disease. Electronically Signed   By: Elberta Fortis M.D.   On: 09/23/2019 14:51    Procedures Procedures (including critical care time)  Medications Ordered in ED Medications  diltiazem (CARDIZEM) 1 mg/mL load via infusion 10 mg (10 mg Intravenous Bolus from Bag 09/23/19 1553)    And  diltiazem (CARDIZEM) 125 mg in dextrose 5% 125 mL (1 mg/mL) infusion (5 mg/hr Intravenous New Bag/Given 09/23/19 1552)  magnesium sulfate IVPB 4 g 100 mL (has no administration in time range)  pantoprazole (PROTONIX) EC tablet 40 mg (has no administration in time range)  folic acid (FOLVITE) tablet 1 mg (has no administration in time range)  LORazepam (  ATIVAN) tablet 1-4 mg (has no administration in time range)  thiamine (B-1) injection 100 mg (has no administration in time range)  multivitamin with minerals tablet 1 tablet (has no administration in time range)  lactated ringers infusion (has no administration in time range)  lactulose (CHRONULAC) 10 GM/15ML solution 20 g (has no administration in time range)  LORazepam (ATIVAN) injection 2 mg (2 mg Intravenous Given 09/23/19 1419)  LORazepam (ATIVAN) injection 2 mg (2 mg Intravenous Given 09/23/19 1444)    ED Course  I have reviewed the triage vital signs and the nursing notes.  Pertinent labs & imaging results that were available during my care of the patient were reviewed by me and considered in my medical decision making (see chart for details).    MDM Rules/Calculators/A&P                      Patient with apparent atrial fibrillation with RVR.  Reviewed EMS strips and I think this is likely the original rhythm.  Will start Cardizem for rate control.  However also is likely withdrawing off alcohol.  States he drinks heavily and reviewing records has required Precedex drip recently.  I think with potential seizure syncope and Cardizem drip and potential Precedex drip will discuss with ICU about possible admission.  CRITICAL  CARE Performed by: Benjiman Core Total critical care time: 30 minutes Critical care time was exclusive of separately billable procedures and treating other patients. Critical care was necessary to treat or prevent imminent or life-threatening deterioration. Critical care was time spent personally by me on the following activities: development of treatment plan with patient and/or surrogate as well as nursing, discussions with consultants, evaluation of patient's response to treatment, examination of patient, obtaining history from patient or surrogate, ordering and performing treatments and interventions, ordering and review of laboratory studies, ordering and review of radiographic studies, pulse oximetry and re-evaluation of patient's condition.  Final Clinical Impression(s) / ED Diagnoses Final diagnoses:  Atrial fibrillation with rapid ventricular response (HCC)  Alcohol withdrawal syndrome without complication [facility])    Rx / DC Orders ED Discharge Orders    None       Benjiman Core, MD 09/23/19 1558

## 2019-09-23 NOTE — ED Triage Notes (Addendum)
Pt to ED via EMS from home. EMS called out for possible seizure activity, questionable VTACH. , EMS arrival HR 300 BPM, adensone 6mg  and then 12mg  given with no improvement. Cardizem 20 mg given and then Cardizem 10 mg given, HR got down to 82 bpm for aprox 1 min, then sustain 160-200 bpm. Pt reports symptoms started this morning. Reports long history of alcohol drinking and alcohol withdrawal/d/ts. Pt currently tremors, nausea, vomiting, sweating Reports last drink yesterday. wallee spanish interpretor used to assist with assessment.

## 2019-09-24 LAB — COMPREHENSIVE METABOLIC PANEL
ALT: 71 U/L — ABNORMAL HIGH (ref 0–44)
AST: 105 U/L — ABNORMAL HIGH (ref 15–41)
Albumin: 2.7 g/dL — ABNORMAL LOW (ref 3.5–5.0)
Alkaline Phosphatase: 82 U/L (ref 38–126)
Anion gap: 9 (ref 5–15)
BUN: <5 mg/dL — ABNORMAL LOW (ref 6–20)
CO2: 26 mmol/L (ref 22–32)
Calcium: 8.5 mg/dL — ABNORMAL LOW (ref 8.9–10.3)
Chloride: 103 mmol/L (ref 98–111)
Creatinine, Ser: 0.63 mg/dL (ref 0.61–1.24)
GFR calc Af Amer: >60 mL/min (ref >60)
GFR calc non Af Amer: >60 mL/min (ref >60)
Glucose, Bld: 93 mg/dL (ref 70–99)
Potassium: 3.5 mmol/L (ref 3.5–5.1)
Sodium: 138 mmol/L (ref 135–145)
Total Bilirubin: 0.9 mg/dL (ref 0.3–1.2)
Total Protein: 6.3 g/dL — ABNORMAL LOW (ref 6.5–8.1)

## 2019-09-24 LAB — CBC
HCT: 39.2 % (ref 39.0–52.0)
Hemoglobin: 13.3 g/dL (ref 13.0–17.0)
MCH: 32.8 pg (ref 26.0–34.0)
MCHC: 33.9 g/dL (ref 30.0–36.0)
MCV: 96.6 fL (ref 80.0–100.0)
Platelets: 69 10*3/uL — ABNORMAL LOW (ref 150–400)
RBC: 4.06 MIL/uL — ABNORMAL LOW (ref 4.22–5.81)
RDW: 13.2 % (ref 11.5–15.5)
WBC: 7.2 10*3/uL (ref 4.0–10.5)
nRBC: 0 % (ref 0.0–0.2)

## 2019-09-24 LAB — GLUCOSE, CAPILLARY
Glucose-Capillary: 115 mg/dL — ABNORMAL HIGH (ref 70–99)
Glucose-Capillary: 116 mg/dL — ABNORMAL HIGH (ref 70–99)
Glucose-Capillary: 118 mg/dL — ABNORMAL HIGH (ref 70–99)
Glucose-Capillary: 127 mg/dL — ABNORMAL HIGH (ref 70–99)
Glucose-Capillary: 144 mg/dL — ABNORMAL HIGH (ref 70–99)

## 2019-09-24 LAB — PHOSPHORUS: Phosphorus: 2.9 mg/dL (ref 2.5–4.6)

## 2019-09-24 LAB — MAGNESIUM: Magnesium: 1.9 mg/dL (ref 1.7–2.4)

## 2019-09-24 MED ORDER — CHLORHEXIDINE GLUCONATE CLOTH 2 % EX PADS
6.0000 | MEDICATED_PAD | Freq: Every day | CUTANEOUS | Status: DC
  Administered 2019-09-24 – 2019-09-28 (×5): 6 via TOPICAL

## 2019-09-24 MED ORDER — METOPROLOL TARTRATE 5 MG/5ML IV SOLN
5.0000 mg | INTRAVENOUS | Status: DC | PRN
  Administered 2019-09-24 – 2019-09-27 (×4): 5 mg via INTRAVENOUS
  Filled 2019-09-24 (×4): qty 5

## 2019-09-24 MED ORDER — LORAZEPAM 2 MG/ML IJ SOLN
1.0000 mg | INTRAMUSCULAR | Status: DC | PRN
  Administered 2019-09-24: 4 mg via INTRAVENOUS
  Administered 2019-09-25 – 2019-09-26 (×4): 2 mg via INTRAVENOUS
  Filled 2019-09-24 (×6): qty 1

## 2019-09-24 MED ORDER — LACTULOSE 10 GM/15ML PO SOLN
20.0000 g | Freq: Two times a day (BID) | ORAL | Status: DC
  Administered 2019-09-24 – 2019-09-28 (×9): 20 g
  Filled 2019-09-24 (×9): qty 30

## 2019-09-24 MED ORDER — METOPROLOL TARTRATE 50 MG PO TABS
50.0000 mg | ORAL_TABLET | Freq: Four times a day (QID) | ORAL | Status: DC
  Administered 2019-09-24 – 2019-09-25 (×5): 50 mg via ORAL
  Filled 2019-09-24 (×6): qty 1

## 2019-09-24 MED ORDER — DEXMEDETOMIDINE HCL IN NACL 400 MCG/100ML IV SOLN
0.4000 ug/kg/h | INTRAVENOUS | Status: DC
  Administered 2019-09-24: 1.2 ug/kg/h via INTRAVENOUS
  Administered 2019-09-25: 0.8 ug/kg/h via INTRAVENOUS
  Administered 2019-09-25: 1 ug/kg/h via INTRAVENOUS
  Administered 2019-09-25: 0.8 ug/kg/h via INTRAVENOUS
  Administered 2019-09-26: 1.2 ug/kg/h via INTRAVENOUS
  Filled 2019-09-24 (×6): qty 100

## 2019-09-24 MED ORDER — LORAZEPAM 2 MG/ML IJ SOLN
INTRAMUSCULAR | Status: AC
  Filled 2019-09-24: qty 2

## 2019-09-24 NOTE — Progress Notes (Signed)
eLink Physician-Brief Progress Note Patient Name: Vincent Peters DOB: 1980/09/25 MRN: [id number]   Date of Service  09/24/2019  HPI/Events of Note  Bradycardia on Diltiazem infusion with beta blocker also given. Heart rate in 70's despite Diltiazem infusion being off x  > 1 hour.  eICU Interventions  Discontinue Diltiazem infusion.        Vincent Peters 09/24/2019, 1:53 AM

## 2019-09-24 NOTE — Consult Note (Signed)
Cardiology Consultation:   Patient ID: Vincent Peters MRN: [id number]; DOB: 09/04/1980  Admit date: 09/23/2019 Date of Consult: 09/24/2019  Primary Care Provider: Patient, No Pcp Per Primary Cardiologist: No primary care provider on file. Nikolina Simerson New Primary Electrophysiologist:  None    Patient Profile:   Vincent Peters is a 39 y.o. male with a hx of heavy alcohol use, associated seizure withdrawal, hypertension, hepatitis, thrombocytopenia who is being seen today for the evaluation of rapid atrial fibrillation paroxysmal at the request of Dr. Elsworth Soho.  History of Present Illness:   Vincent Peters is a 40 year old male with heavy alcohol use last reported drink on 2/18 here with developing tremors nausea vomiting diaphoresis withdrawal and extremely fast heart rate.  EMS reported "300 ".  Adenosine was administered but demonstrated underlying atrial fibrillation per report.  Narrow complex tachycardia.  His urine drug screen was also positive for benzodiazepines.  Had an anion gap metabolic acidosis.  Platelet count of 90,000.  Cardizem drip was initially utilized.  Drinks 8 beers [employer] on workdays and 18 on days off.  EKG on 09/23/2019 shows atrial fibrillation heart rate 184 bpm with associated repolarization abnormalities  Currently he is fairly comfortable in bed, has just received some phenobarbital.  Somewhat tangential in his thoughts.  Denies any chest pain.  No shortness of breath.  Heart Pathway Score:     Past Medical History:  Diagnosis Date  . Alcohol abuse   . Cocaine abuse ([place])     No past surgical history on file.   Home Medications:  Prior to Admission medications   Medication Sig Start Date End Date Taking? Authorizing Provider  amLODipine (NORVASC) 5 MG tablet Take 1 tablet (5 mg total) by mouth daily. Patient not taking: Reported on 09/23/2019 07/17/19   Cristal Ford, DO  folic acid (FOLVITE) 1 MG tablet Take 1  tablet (1 mg total) by mouth daily. Patient not taking: Reported on 09/23/2019 07/17/19   Cristal Ford, DO  lactulose (CHRONULAC) 10 GM/15ML solution Take 30 mLs (20 g total) by mouth 2 (two) times daily. Patient not taking: Reported on 09/23/2019 07/16/19   Cristal Ford, DO  Multiple Vitamin (MULTIVITAMIN WITH MINERALS) TABS tablet Take 1 tablet by mouth daily. Patient not taking: Reported on 09/23/2019 07/17/19   Cristal Ford, DO  pantoprazole (PROTONIX) 40 MG tablet Take 1 tablet (40 mg total) by mouth at bedtime. Patient not taking: Reported on 09/23/2019 07/16/19   Cristal Ford, DO  thiamine 100 MG tablet Take 1 tablet (100 mg total) by mouth daily. Patient not taking: Reported on 09/23/2019 07/17/19   Cristal Ford, DO    Inpatient Medications: Scheduled Meds: . Chlorhexidine Gluconate Cloth  6 each Topical Daily  . cloNIDine  0.1 mg Oral TID  . folic acid  1 mg Oral Daily  . lactulose  20 g Per Tube BID  . metoprolol tartrate  25 mg Oral BID  . multivitamin with minerals  1 tablet Oral Daily  . pantoprazole  40 mg Oral QHS  . phenobarbital  97.2 mg Oral TID   Followed by  . [START ON 09/25/2019] phenobarbital  64.8 mg Oral TID   Followed by  . [START ON 09/27/2019] phenobarbital  32.4 mg Oral TID   Followed by  . [START ON 09/29/2019] phenobarbital  16.2 mg Oral TID  . thiamine  100 mg Intravenous Daily   Continuous Infusions: . lactated ringers 75 mL/hr at 09/24/19 1500   PRN Meds: LORazepam **OR** [DISCONTINUED] LORazepam, metoprolol tartrate, ondansetron (  ZOFRAN) IV  Allergies:   No Known Allergies  Social History:   Social History   Socioeconomic History  . Marital status: Single    Spouse name: Not on file  . Number of children: Not on file  . Years of education: Not on file  . Highest education level: Not on file  Occupational History  . Not on file  Tobacco Use  . Smoking status: Light Tobacco Smoker    Types: Cigarettes  . Smokeless  tobacco: Never Used  Substance and Sexual Activity  . Alcohol use: Yes    Alcohol/week: 168.0 standard drinks    Types: 168 Cans of beer per week  . Drug use: Yes    Types: Cocaine  . Sexual activity: Not on file  Other Topics Concern  . Not on file  Social History Narrative   ** Merged History Encounter **       Social Determinants of Health   Financial Resource Strain:   . Difficulty of Paying Living Expenses: Not on file  Food Insecurity:   . Worried About Programme researcher, broadcasting/film/video in the Last Year: Not on file  . Ran Out of Food in the Last Year: Not on file  Transportation Needs:   . Lack of Transportation (Medical): Not on file  . Lack of Transportation (Non-Medical): Not on file  Physical Activity:   . Days of Exercise per Week: Not on file  . Minutes of Exercise per Session: Not on file  Stress:   . Feeling of Stress : Not on file  Social Connections:   . Frequency of Communication with Friends and Family: Not on file  . Frequency of Social Gatherings with Friends and Family: Not on file  . Attends Religious Services: Not on file  . Active Member of Clubs or Organizations: Not on file  . Attends Banker Meetings: Not on file  . Marital Status: Not on file  Intimate Partner Violence:   . Fear of Current or Ex-Partner: Not on file  . Emotionally Abused: Not on file  . Physically Abused: Not on file  . Sexually Abused: Not on file    Family History:   No family history on file. No reported early family history of CAD   ROS:  Please see the history of present illness.  No chest discomfort, no bleeding All other ROS reviewed and negative.     Physical Exam/Data:   Vitals:   09/24/19 1205 09/24/19 1300 09/24/19 1400 09/24/19 1500  BP:  110/76    Pulse:  (!) 103 (!) 47 98  Resp:  (!) 33 18 14  Temp: 98.6 F (37 C)     TempSrc: Oral     SpO2:  100% 100% 100%    Intake/Output Summary (Last 24 hours) at 09/24/2019 1533 Last data filed at 09/24/2019  1500 Gross per 24 hour  Intake 3862.52 ml  Output 1600 ml  Net 2262.52 ml   Last 3 Weights 07/16/2019 07/15/2019 07/13/2019  Weight (lbs) 132 lb 4.4 oz 133 lb 13.1 oz 138 lb 14.2 oz  Weight (kg) 60 kg 60.7 kg 63 kg     There is no height or weight on file to calculate BMI.  General:  Well nourished, well developed, in no acute distress HEENT: normal Lymph: no adenopathy Neck: no JVD Endocrine:  No thryomegaly Vascular: No carotid bruits; FA pulses 2+ bilaterally without bruits  Cardiac:  normal S1, S2; irregular, tachycardic; no murmur  Lungs:  clear to  auscultation bilaterally, no wheezing, rhonchi or rales  Abd: soft, nontender, no hepatomegaly  Ext: no edema Musculoskeletal:  No deformities, BUE and BLE strength normal and equal Skin: warm and dry  Neuro: Nonfocal, mildly tremulous Psych: Anxious  EKG:  The EKG was personally reviewed and demonstrates: A. fib 184 bpm   Telemetry:  Telemetry was personally reviewed and demonstrates: Atrial fibrillation 110s to 130s  Relevant CV Studies: Echocardiogram pending  Laboratory Data:  High Sensitivity Troponin:   Recent Labs  Lab 09/23/19 1414  TROPONINIHS 11     Chemistry Recent Labs  Lab 09/23/19 1414 09/23/19 1900 09/24/19 0504  NA 138 134* 138  K 3.7 3.6 3.5  CL 99 99 103  CO2 17* 23 26  GLUCOSE 160* 115* 93  BUN <5* <5* <5*  CREATININE 0.75 0.53* 0.63  CALCIUM 8.9 8.2* 8.5*  GFRNONAA >60 >60 >60  GFRAA >60 >60 >60  ANIONGAP 22* 12 9    Recent Labs  Lab 09/23/19 1414 09/23/19 1900 09/24/19 0504  PROT 7.7 6.8 6.3*  ALBUMIN 3.3* 3.0* 2.7*  AST 187* 134* 105*  ALT 98* 84* 71*  ALKPHOS 87 89 82  BILITOT 1.2 1.2 0.9   Hematology Recent Labs  Lab 09/23/19 1414 09/24/19 0504  WBC 10.5 7.2  RBC 4.46 4.06*  HGB 14.5 13.3  HCT 43.3 39.2  MCV 97.1 96.6  MCH 32.5 32.8  MCHC 33.5 33.9  RDW 13.2 13.2  PLT 90* 69*   BNPNo results for input(s): BNP, PROBNP in the last 168 hours.  DDimer No results  for input(s): DDIMER in the last 168 hours.   Radiology/Studies:  DG Chest Portable 1 View  Result Date: 09/23/2019 CLINICAL DATA:  Shortness of breath with tremors, nausea, vomiting and diaphoresis. EXAM: PORTABLE CHEST 1 VIEW COMPARISON:  04/25/2019 FINDINGS: Lungs are somewhat hypoinflated but otherwise clear. Cardiomediastinal silhouette and remainder of the exam is unchanged. IMPRESSION: No active disease. Electronically Signed   By: Elberta Fortis M.D.   On: 09/23/2019 14:51         Assessment and Plan:    Atrial fibrillation with rapid ventricular response -Hopefully this will continue to improve as his alcohol withdrawal also continues to improve. -Keeping potassium and magnesium greater than 4. -Adenosine was utilized previously, demonstrating underlying atrial fibrillation. -Plan is to utilize metoprolol which I agree with given his hyper adrenergic state.  I will go ahead and prescribe him metoprolol tartrate 50 mg p.o. every 6 hours with hold parameters.  Do not think we need to perform any urgent cardioversion.  I agree that given his CHA2DS2-VASc of 1 for hypertension, he does not require anticoagulation. I will check an echocardiogram to ensure proper structure and function and to make sure he does not have an alcohol-related cardiomyopathy.  Alcohol abuse/delirium tremens -On CIWA protocol.  Thrombocytopenia -From heavy alcohol use.      For questions or updates, please contact [facility] Please consult [email or web address] for contact info under     Signed, Donato Schultz, MD  09/24/2019 3:33 PM

## 2019-09-24 NOTE — Progress Notes (Signed)
Pt sleeping, intermittent bradycardia episodes. Cardizem gtt titrated to off, ELink aware.

## 2019-09-24 NOTE — Progress Notes (Signed)
NAMEGuthrie Peters, MRN:  [id number], DOB:  January 30, 1981, LOS: 1 ADMISSION DATE:  09/23/2019, CONSULTATION DATE:  2/19 REFERRING MD:  Alvino Chapel, CHIEF COMPLAINT:  Acute delirium presumed w/d & AF w/ RVR  Brief History   39 year old male heavy history of EtOH, last drink reported on 2/18.  Admitted 2/19 tremulous, slightly restless, and in narrow-complex tachycardia.  Critical care asked to admit  History of present illness   39 year old male patient with a history of alcoholism and prior withdrawal with delirium tremens.  Presented to the emergency room on 2/19.  Ported that his last drink was on 2/18 and he was starting to develop tremors, nausea vomiting and diaphoresis because of this he felt he was again going through withdrawal and he called EMS.  On EMS arrival he was found to be in a narrow-complex tachycardia with heart rate 300 reported by EMS.  He was given 6 mg of adenosine, then 12 mg, the two boluses of diltiazem.  He did briefly return his heart rate to 82, but then once again returned to rate of 150s to 160s.  As stated he was tremulous on arrival, shared with the emergency room physician previously he has needed to be "tied down".  He received a total of 4 mg of lorazepam with little improvement in his symptoms and ongoing tachycardia and because of this the critical care team was asked to admit particularly because he is required Precedex infusion in the past to stabilize his delirium.   Past Medical History  Alcohol abuse, prior seizure in the setting of withdrawal, accelerated hypertension, prior alcoholic hepatitis with hyperammonemia, thrombocytopenia, prior tachycardia  Significant Hospital Events   2/19 admitted with working diagnosis of DTs and narrow-complex tachycardia >> cardizem gtt  Consults:    Procedures:    Significant Diagnostic Tests:  Ammonia level 2/19:  84 UDS 2/19: + benzos Ethanol 2/19: 29, Mg 1.5, PLT 90, + anion gap metabolic acidosis    Micro Data:    Antimicrobials:    Interim history/subjective:  Denies pain, palpitations Indicates that he drinks 8 beers [employer] on workdays and 18 on days off  Objective   Blood pressure (!) 122/94, pulse 73, temperature 98.5 F (36.9 C), temperature source Oral, resp. rate 15, SpO2 98 %.        Intake/Output Summary (Last 24 hours) at 09/24/2019 0845 Last data filed at 09/24/2019 0800 Gross per 24 hour  Intake 1834.06 ml  Output 1225 ml  Net 609.06 ml   There were no vitals filed for this visit.  Examination: General: adult male , supine in bed, no distress HENT: [place]/AT, PERRL, no JVD, no icterus Lungs: Clear Cardiovascular: S1-S2 regular Abdomen: Soft, non-tender, non-distended Extremities: No acute deformity or ROM limitation Neuro: Alert, oriented, non-focal, tremors present   Chest x-ray 2/19 personally reviewed, clear lungs Labs show mild hypokalemia, no leukocytosis  Resolved Hospital Problem list     Assessment & Plan:   Alcohol withdrawal: He has a known history of alcohol and cocaine abuse. History of ETOH withdrawal seizures and ICU admissions for Precedex.   - ct Phenobarbital load -  Ativan CIWA for breakthrough -has not needed this overnight, doubt that he needs Precedex - Thiamine and folate - Multivitamin -Hypokalemia and hypomagnesemia were repleted - Seizure precautions.   Atrial fibrillation with RVR: rate initially in 300s. Given adenosine x 2, found to be AF and started on diltiazem infusion w/ bolus. Suspect this is driven by dehydration, withdrawal. . CHADSVASC 1. No  need to start [facility] at this time.  - Telemetry monitoring - Off Diltiazem infusion , use Lopressor 25 twice daily and titrate to rate control, use IV Lopressor as needed for breakthrough - IVF hydration  Hypertensive crisis: improved -  Holding home amlodipine for now  Anion gap acidosis: Resolved, likely alcoholic ketoacidosis. Glucose is mildly elevated, doubt DKA.   Although slight high beta hydroxybutyrate and urine ketones positive.  Lactate negative   Transaminitis: AST 187 and ALT 98 on admission. Ammonia 84. Total bilirubin 1.2. RUQ ultrasound in 07/2019 did not demonstrate cirrhosis.  -LFTs decreasing - Lactulose  Best practice:  Diet: NPO Pain/Anxiety/Delirium protocol (if indicated): CIWA protocol  VAP protocol (if indicated): NA DVT prophylaxis: SCD GI prophylaxis: PPI Glucose control: SSI Mobility: BR w/ seizure precautions  Code Status: Full Code  Family Communication: Patient updated Disposition:  ICU   .The patient is critically ill with multiple organ systems failure and requires high complexity decision making for assessment and support, frequent evaluation and titration of therapies, application of advanced monitoring technologies and extensive interpretation of multiple databases. Critical Care Time devoted to patient care services described in this note independent of APP/resident  time is 31 minutes.   Cyril Mourning MD. Tonny Bollman. [facility]  If no response to pager , please call [phone]   09/24/2019

## 2019-09-25 ENCOUNTER — Inpatient Hospital Stay (HOSPITAL_COMMUNITY): Payer: Self-pay

## 2019-09-25 DIAGNOSIS — I4891 Unspecified atrial fibrillation: Secondary | ICD-10-CM

## 2019-09-25 DIAGNOSIS — I340 Nonrheumatic mitral (valve) insufficiency: Secondary | ICD-10-CM

## 2019-09-25 DIAGNOSIS — I480 Paroxysmal atrial fibrillation: Secondary | ICD-10-CM

## 2019-09-25 LAB — CBC
HCT: 41.7 % (ref 39.0–52.0)
Hemoglobin: 14 g/dL (ref 13.0–17.0)
MCH: 32.7 pg (ref 26.0–34.0)
MCHC: 33.6 g/dL (ref 30.0–36.0)
MCV: 97.4 fL (ref 80.0–100.0)
Platelets: 75 10*3/uL — ABNORMAL LOW (ref 150–400)
RBC: 4.28 MIL/uL (ref 4.22–5.81)
RDW: 13.2 % (ref 11.5–15.5)
WBC: 7.1 10*3/uL (ref 4.0–10.5)
nRBC: 0 % (ref 0.0–0.2)

## 2019-09-25 LAB — GLUCOSE, CAPILLARY
Glucose-Capillary: 120 mg/dL — ABNORMAL HIGH (ref 70–99)
Glucose-Capillary: 123 mg/dL — ABNORMAL HIGH (ref 70–99)
Glucose-Capillary: 108 mg/dL — ABNORMAL HIGH (ref 70–99)
Glucose-Capillary: 145 mg/dL — ABNORMAL HIGH (ref 70–99)
Glucose-Capillary: 101 mg/dL — ABNORMAL HIGH (ref 70–99)

## 2019-09-25 LAB — COMPREHENSIVE METABOLIC PANEL
ALT: 87 U/L — ABNORMAL HIGH (ref 0–44)
AST: 136 U/L — ABNORMAL HIGH (ref 15–41)
Albumin: 2.7 g/dL — ABNORMAL LOW (ref 3.5–5.0)
Alkaline Phosphatase: 70 U/L (ref 38–126)
Anion gap: 10 (ref 5–15)
BUN: <5 mg/dL — ABNORMAL LOW (ref 6–20)
CO2: 26 mmol/L (ref 22–32)
Calcium: 8.8 mg/dL — ABNORMAL LOW (ref 8.9–10.3)
Chloride: 102 mmol/L (ref 98–111)
Creatinine, Ser: 0.53 mg/dL — ABNORMAL LOW (ref 0.61–1.24)
GFR calc Af Amer: >60 mL/min (ref >60)
GFR calc non Af Amer: >60 mL/min (ref >60)
Glucose, Bld: 116 mg/dL — ABNORMAL HIGH (ref 70–99)
Potassium: 3.6 mmol/L (ref 3.5–5.1)
Sodium: 138 mmol/L (ref 135–145)
Total Bilirubin: 1.1 mg/dL (ref 0.3–1.2)
Total Protein: 6.6 g/dL (ref 6.5–8.1)

## 2019-09-25 LAB — ECHOCARDIOGRAM COMPLETE: Weight: 2080 oz

## 2019-09-25 LAB — MAGNESIUM: Magnesium: 1.6 mg/dL — ABNORMAL LOW (ref 1.7–2.4)

## 2019-09-25 MED ORDER — POTASSIUM CHLORIDE CRYS ER 20 MEQ PO TBCR
20.0000 meq | EXTENDED_RELEASE_TABLET | ORAL | Status: AC
  Administered 2019-09-25 (×2): 20 meq via ORAL
  Filled 2019-09-25 (×2): qty 1

## 2019-09-25 MED ORDER — PERFLUTREN LIPID MICROSPHERE
1.0000 mL | INTRAVENOUS | Status: AC | PRN
  Administered 2019-09-25: 2 mL via INTRAVENOUS
  Filled 2019-09-25: qty 10

## 2019-09-25 MED ORDER — CHLORHEXIDINE GLUCONATE 0.12 % MT SOLN
15.0000 mL | Freq: Two times a day (BID) | OROMUCOSAL | Status: DC
  Administered 2019-09-25 – 2019-09-29 (×7): 15 mL via OROMUCOSAL
  Filled 2019-09-25 (×6): qty 15

## 2019-09-25 MED ORDER — MAGNESIUM SULFATE 2 GM/50ML IV SOLN
2.0000 g | Freq: Once | INTRAVENOUS | Status: AC
  Administered 2019-09-25: 2 g via INTRAVENOUS
  Filled 2019-09-25: qty 50

## 2019-09-25 MED ORDER — ORAL CARE MOUTH RINSE
15.0000 mL | Freq: Two times a day (BID) | OROMUCOSAL | Status: DC
  Administered 2019-09-25 – 2019-09-29 (×8): 15 mL via OROMUCOSAL

## 2019-09-25 NOTE — Progress Notes (Signed)
Patient on Precedex drip.  Remains under PCCM care.

## 2019-09-25 NOTE — Progress Notes (Signed)
Vincent Peters, MRN:  [id number], DOB:  1980/08/26, LOS: 2 ADMISSION DATE:  09/23/2019, CONSULTATION DATE:  2/19 REFERRING MD:  Rubin Payor, CHIEF COMPLAINT:  Acute delirium presumed w/d & AF w/ RVR  Brief History   39 year old male heavy history of EtOH, last drink reported on 2/18.  Admitted 2/19 tremulous, slightly restless, and in narrow-complex tachycardia.  Critical care asked to admit Transiently  required Cardizem drip   Past Medical History  Alcohol abuse, prior seizure in the setting of withdrawal, accelerated hypertension, prior alcoholic hepatitis with hyperammonemia, thrombocytopenia, prior tachycardia  Significant Hospital Events   2/19 admitted with working diagnosis of DTs and narrow-complex tachycardia >> cardizem gtt 2/20 agitation requiring Precedex drip  Consults:    Procedures:    Significant Diagnostic Tests:  Ammonia level 2/19:  84 UDS 2/19: + benzos Ethanol 2/19: 29, Mg 1.5, PLT 90, + anion gap metabolic acidosis   Micro Data:    Antimicrobials:    Interim history/subjective:   Afebrile Calmer on Precedex  Objective   Blood pressure 90/62, pulse 81, temperature (!) 97.1 F (36.2 C), temperature source Axillary, resp. rate 18, weight 59 kg, SpO2 99 %.        Intake/Output Summary (Last 24 hours) at 09/25/2019 1044 Last data filed at 09/25/2019 1000 Gross per 24 hour  Intake 3786.27 ml  Output 1050 ml  Net 2736.27 ml   Filed Weights   09/24/19 1800  Weight: 59 kg    Examination: General: adult male , supine in bed, no distress HENT: [place]/AT, PERRL, no JVD, no icterus Lungs: Clear Cardiovascular: S1-S2 regular, tacky Abdomen: Soft, non-tender, non-distended Extremities: No acute deformity or ROM limitation Neuro: Confused this morning, nonfocal, mild tremors   Chest x-ray 2/19 personally reviewed, clear lungs Labs show mild hypokalemia, no leukocytosis, mild hypomagnesemia  Resolved Hospital Problem list     Assessment & Plan:   Alcohol withdrawal: He has a known history of alcohol and cocaine abuse. History of ETOH withdrawal seizures and ICU admissions for Precedex.   - ct Phenobarbital load -  Ativan CIWA for breakthrough - -ct Precedex  Gtt for 24 hours, then taper - Thiamine and folate - Multivitamin -Hypokalemia and hypomagnesemia will be repleted - Seizure precautions.   Atrial fibrillation with RVR: rate initially in 300s. Given adenosine x 2, found to be AF and started on diltiazem infusion w/ bolus. Suspect this is driven by dehydration, withdrawal. . CHADSVASC 1.  - Telemetry  - Off Diltiazem infusion , use Lopressor 50 twice daily and titrate to rate control, use IV Lopressor as needed for breakthrough - IVF hydration -No anticoagulation  Hypertensive crisis: improved -  Holding home amlodipine for now  Anion gap acidosis: Resolved, likely alcoholic ketoacidosis. Glucose is mildly elevated, doubt DKA.  Although slight high beta hydroxybutyrate and urine ketones positive.  Lactate negative   Transaminitis: AST 187 and ALT 98 on admission. Ammonia 84.  RUQ ultrasound in 07/2019 did not demonstrate cirrhosis.  -LFTs decreasing, monitor - Lactulose  Thrombocytopenia-monitor  Best practice:  Diet: NPO Pain/Anxiety/Delirium protocol (if indicated): CIWA protocol  VAP protocol (if indicated): NA DVT prophylaxis: SCD GI prophylaxis: PPI Glucose control: SSI Mobility: BR w/ seizure precautions  Code Status: Full Code  Family Communication: Patient  Disposition: Stay in ICU while on Precedex  The patient is critically ill with multiple organ systems failure and requires high complexity decision making for assessment and support, frequent evaluation and titration of therapies, application of advanced monitoring technologies and  extensive interpretation of multiple databases. Critical Care Time devoted to patient care services described in this note independent of APP/resident   time is 31 minutes.     Kara Mead MD. [facility]. [facility]  If no response to pager , please call [phone]   09/25/2019

## 2019-09-25 NOTE — Progress Notes (Signed)
Progress Note  Patient Name: Vincent Peters Date of Encounter: 09/25/2019  Primary Cardiologist: No primary care provider on file. Nadalee Neiswender new  Subjective   Calm on Precedex  Inpatient Medications    Scheduled Meds: . Chlorhexidine Gluconate Cloth  6 each Topical Daily  . folic acid  1 mg Oral Daily  . lactulose  20 g Per Tube BID  . metoprolol tartrate  50 mg Oral QID  . multivitamin with minerals  1 tablet Oral Daily  . pantoprazole  40 mg Oral QHS  . phenobarbital  97.2 mg Oral TID   Followed by  . phenobarbital  64.8 mg Oral TID   Followed by  . [START ON 09/27/2019] phenobarbital  32.4 mg Oral TID   Followed by  . [START ON 09/29/2019] phenobarbital  16.2 mg Oral TID  . thiamine  100 mg Intravenous Daily   Continuous Infusions: . dexmedetomidine (PRECEDEX) IV infusion 0.8 mcg/kg/hr (09/25/19 1000)  . lactated ringers 75 mL/hr at 09/25/19 1000   PRN Meds: LORazepam, LORazepam **OR** [DISCONTINUED] LORazepam, metoprolol tartrate, ondansetron (ZOFRAN) IV, perflutren lipid microspheres (DEFINITY) IV suspension   Vital Signs    Vitals:   09/25/19 0800 09/25/19 0900 09/25/19 0937 09/25/19 1000  BP: 111/82 110/72 111/82 90/62  Pulse: (!) 122 98  81  Resp: 12 13    Temp:      TempSrc:      SpO2: 96% 98%  99%  Weight:        Intake/Output Summary (Last 24 hours) at 09/25/2019 1022 Last data filed at 09/25/2019 1000 Gross per 24 hour  Intake 3786.27 ml  Output 1050 ml  Net 2736.27 ml   Last 3 Weights 09/24/2019 07/16/2019 07/15/2019  Weight (lbs) 130 lb 132 lb 4.4 oz 133 lb 13.1 oz  Weight (kg) 58.968 kg 60 kg 60.7 kg      Telemetry    Atrial fibrillation heart rate 1 10-1 20 currently- Personally Reviewed  ECG    Atrial fibrillation with rapid ventricular response- Personally Reviewed  Physical Exam   GEN:  In bed, minimal agitation Neck: No JVD Cardiac:  Irregularly irregular, mildly tachycardic, no murmurs, rubs, or gallops.    Respiratory: Clear to auscultation bilaterally. GI: Soft, nontender, non-distended  MS: No edema; No deformity. Neuro:  Nonfocal  Psych: Minimal agitation  Labs    High Sensitivity Troponin:   Recent Labs  Lab 09/23/19 1414  TROPONINIHS 11      Chemistry Recent Labs  Lab 09/23/19 1900 09/24/19 0504 09/25/19 0230  NA 134* 138 138  K 3.6 3.5 3.6  CL 99 103 102  CO2 23 26 26   GLUCOSE 115* 93 116*  BUN <5* <5* <5*  CREATININE 0.53* 0.63 0.53*  CALCIUM 8.2* 8.5* 8.8*  PROT 6.8 6.3* 6.6  ALBUMIN 3.0* 2.7* 2.7*  AST 134* 105* 136*  ALT 84* 71* 87*  ALKPHOS 89 82 70  BILITOT 1.2 0.9 1.1  GFRNONAA >60 >60 >60  GFRAA >60 >60 >60  ANIONGAP 12 9 10      Hematology Recent Labs  Lab 09/23/19 1414 09/24/19 0504 09/25/19 0230  WBC 10.5 7.2 7.1  RBC 4.46 4.06* 4.28  HGB 14.5 13.3 14.0  HCT 43.3 39.2 41.7  MCV 97.1 96.6 97.4  MCH 32.5 32.8 32.7  MCHC 33.5 33.9 33.6  RDW 13.2 13.2 13.2  PLT 90* 69* 75*    BNPNo results for input(s): BNP, PROBNP in the last 168 hours.   DDimer No results for input(s): DDIMER  in the last 168 hours.   Radiology    DG Chest Portable 1 View  Result Date: 09/23/2019 CLINICAL DATA:  Shortness of breath with tremors, nausea, vomiting and diaphoresis. EXAM: PORTABLE CHEST 1 VIEW COMPARISON:  04/25/2019 FINDINGS: Lungs are somewhat hypoinflated but otherwise clear. Cardiomediastinal silhouette and remainder of the exam is unchanged. IMPRESSION: No active disease. Electronically Signed   By: Marin Olp M.D.   On: 09/23/2019 14:51    Cardiac Studies   Echo pending  Patient Profile     39 y.o. male with atrial fibrillation rapid ventricular response in the setting of delirium tremens, alcohol withdrawal.  Assessment & Plan    Atrial fibrillation with rapid ventricular response -Utilizing metoprolol p.o. 50 mg 4 times daily with hold parameters in place.  Hopefully this will help to blunt his adrenergic tone and improve his overall  heart rate.  This seems to be reasonably well-controlled currently.  Blood pressures have ranged from 90 to 465 systolic. -CHA2DS2-VASc 1 for hypertension.  No long-term anticoagulation. -Hopefully over the next several hours/days he will auto convert back to sinus rhythm.  There is no indication for urgent cardioversion. -Checking echocardiogram to ensure relatively normal function.  Thrombocytopenia -Heavy alcohol use  Alcohol withdrawal/delirium tremens -CIWA protocol     For questions or updates, please contact [facility] Please consult [email or web address] for contact info under        Signed, Candee Furbish, MD  09/25/2019, 10:22 AM

## 2019-09-25 NOTE — Progress Notes (Signed)
[facility] ADULT ICU REPLACEMENT PROTOCOL FOR AM LAB REPLACEMENT ONLY  The patient does apply for the [facility] Adult ICU Electrolyte Replacment Protocol based on the criteria listed below:   1. Is GFR >/= 40 ml/min? Yes.    Patient's GFR today is >60 2. Is urine output >/= 0.5 ml/kg/hr for the last 6 hours? Yes.   Patient's UOP is 0.8 ml/kg/hr 3. Is BUN < 60 mg/dL? Yes.    Patient's BUN today is <5 4. Abnormal electrolyte(s): K+3.6 5. Ordered repletion with: protocol 6. If a panic level lab has been reported, has the CCM MD in charge been notified? Yes.  .   Physician:  Dr. Antonietta Jewel, Lilia Argue 09/25/2019 5:44 AM

## 2019-09-26 LAB — COMPREHENSIVE METABOLIC PANEL
ALT: 129 U/L — ABNORMAL HIGH (ref 0–44)
AST: 188 U/L — ABNORMAL HIGH (ref 15–41)
Albumin: 2.7 g/dL — ABNORMAL LOW (ref 3.5–5.0)
Alkaline Phosphatase: 80 U/L (ref 38–126)
Anion gap: 13 (ref 5–15)
BUN: <5 mg/dL — ABNORMAL LOW (ref 6–20)
CO2: 23 mmol/L (ref 22–32)
Calcium: 8.9 mg/dL (ref 8.9–10.3)
Chloride: 102 mmol/L (ref 98–111)
Creatinine, Ser: 0.49 mg/dL — ABNORMAL LOW (ref 0.61–1.24)
GFR calc Af Amer: >60 mL/min (ref >60)
GFR calc non Af Amer: >60 mL/min (ref >60)
Glucose, Bld: 118 mg/dL — ABNORMAL HIGH (ref 70–99)
Potassium: 3.8 mmol/L (ref 3.5–5.1)
Sodium: 138 mmol/L (ref 135–145)
Total Bilirubin: 1 mg/dL (ref 0.3–1.2)
Total Protein: 6.3 g/dL — ABNORMAL LOW (ref 6.5–8.1)

## 2019-09-26 LAB — CBC
HCT: 37.2 % — ABNORMAL LOW (ref 39.0–52.0)
Hemoglobin: 12.8 g/dL — ABNORMAL LOW (ref 13.0–17.0)
MCH: 33 pg (ref 26.0–34.0)
MCHC: 34.4 g/dL (ref 30.0–36.0)
MCV: 95.9 fL (ref 80.0–100.0)
Platelets: 77 10*3/uL — ABNORMAL LOW (ref 150–400)
RBC: 3.88 MIL/uL — ABNORMAL LOW (ref 4.22–5.81)
RDW: 13 % (ref 11.5–15.5)
WBC: 6.9 10*3/uL (ref 4.0–10.5)
nRBC: 0 % (ref 0.0–0.2)

## 2019-09-26 LAB — GLUCOSE, CAPILLARY
Glucose-Capillary: 117 mg/dL — ABNORMAL HIGH (ref 70–99)
Glucose-Capillary: 89 mg/dL (ref 70–99)
Glucose-Capillary: 132 mg/dL — ABNORMAL HIGH (ref 70–99)
Glucose-Capillary: 116 mg/dL — ABNORMAL HIGH (ref 70–99)

## 2019-09-26 LAB — PHOSPHORUS: Phosphorus: 4.7 mg/dL — ABNORMAL HIGH (ref 2.5–4.6)

## 2019-09-26 LAB — MAGNESIUM: Magnesium: 1.5 mg/dL — ABNORMAL LOW (ref 1.7–2.4)

## 2019-09-26 MED ORDER — PHENOBARBITAL SODIUM 65 MG/ML IJ SOLN
65.0000 mg | Freq: Once | INTRAMUSCULAR | Status: AC
  Administered 2019-09-26: 65 mg via INTRAVENOUS
  Filled 2019-09-26: qty 1

## 2019-09-26 MED ORDER — PANTOPRAZOLE SODIUM 40 MG IV SOLR
40.0000 mg | INTRAVENOUS | Status: DC
  Administered 2019-09-26 – 2019-09-27 (×2): 40 mg via INTRAVENOUS
  Filled 2019-09-26 (×2): qty 40

## 2019-09-26 MED ORDER — CHLORDIAZEPOXIDE HCL 25 MG PO CAPS
25.0000 mg | ORAL_CAPSULE | Freq: Four times a day (QID) | ORAL | Status: AC
  Administered 2019-09-26 (×4): 25 mg via ORAL
  Filled 2019-09-26 (×4): qty 1

## 2019-09-26 MED ORDER — CHLORDIAZEPOXIDE HCL 25 MG PO CAPS
25.0000 mg | ORAL_CAPSULE | ORAL | Status: AC
  Administered 2019-09-28 (×2): 25 mg via ORAL
  Filled 2019-09-26 (×2): qty 1

## 2019-09-26 MED ORDER — CHLORDIAZEPOXIDE HCL 25 MG PO CAPS
25.0000 mg | ORAL_CAPSULE | Freq: Three times a day (TID) | ORAL | Status: AC
  Administered 2019-09-27 (×3): 25 mg via ORAL
  Filled 2019-09-26 (×3): qty 1

## 2019-09-26 MED ORDER — CHLORDIAZEPOXIDE HCL 25 MG PO CAPS
25.0000 mg | ORAL_CAPSULE | Freq: Every day | ORAL | Status: AC
  Administered 2019-09-29: 25 mg via ORAL
  Filled 2019-09-26: qty 1

## 2019-09-26 MED ORDER — SODIUM CHLORIDE 0.9% FLUSH: 10.0000 mL | INTRAVENOUS | Status: DC | PRN

## 2019-09-26 MED ORDER — METOPROLOL TARTRATE 50 MG PO TABS
100.0000 mg | ORAL_TABLET | Freq: Two times a day (BID) | ORAL | Status: DC
  Administered 2019-09-26 (×2): 100 mg via ORAL
  Filled 2019-09-26 (×2): qty 2

## 2019-09-26 MED ORDER — MAGNESIUM SULFATE IN D5W 1-5 GM/100ML-% IV SOLN
1.0000 g | Freq: Once | INTRAVENOUS | Status: AC
  Administered 2019-09-26: 1 g via INTRAVENOUS
  Filled 2019-09-26: qty 100

## 2019-09-26 MED ORDER — APIXABAN 5 MG PO TABS
5.0000 mg | ORAL_TABLET | Freq: Two times a day (BID) | ORAL | Status: DC
  Administered 2019-09-26 – 2019-09-29 (×7): 5 mg via ORAL
  Filled 2019-09-26 (×7): qty 1

## 2019-09-26 NOTE — Progress Notes (Signed)
eLink Physician-Brief Progress Note Patient Name: Vincent Peters DOB: Dec 31, 1980 MRN: [id number]   Date of Service  09/26/2019  HPI/Events of Note  Notified of patient refusing PO medications  eICU Interventions   Will give a one time dose of phenobarbital 65 mg IV as he has scheduled PO dose (discussed with pharmacy)  Shifted protonix to PO and may give metoprolol IV that is previously ordered     Intervention Category Intermediate Interventions: Best-practice therapies (e.g. DVT, beta blocker, etc.) Minor Interventions: Agitation / anxiety - evaluation and management  Darl Pikes 09/26/2019, 12:11 AM

## 2019-09-26 NOTE — Progress Notes (Signed)
Spanish Interpreter line used for pt communication. Attempted to administer scheduled po meds. Pt confused, able to answer few questions appropriately, pt aggressive and beligerent, swinging arms and feet at nursing staff at times. This rn and safety sitter at bedside to reassure pt and maintain pt safety. Precedex drip increased from 1.0 to 1.2 mg/hr and ativan 2mg  ivp given. Unable to administer scheduled po meds.

## 2019-09-26 NOTE — Progress Notes (Signed)
Progress Note  Patient Name: Vincent Peters Date of Encounter: 09/26/2019  Primary Cardiologist: No primary care provider on file. [place]   Subjective   Fairly sedate but confused  Inpatient Medications    Scheduled Meds: . chlorhexidine  15 mL Mouth Rinse BID  . Chlorhexidine Gluconate Cloth  6 each Topical Daily  . folic acid  1 mg Oral Daily  . lactulose  20 g Per Tube BID  . mouth rinse  15 mL Mouth Rinse q12n4p  . metoprolol tartrate  50 mg Oral QID  . multivitamin with minerals  1 tablet Oral Daily  . pantoprazole (PROTONIX) IV  40 mg Intravenous Q24H  . PHENobarbital  64.8 mg Oral TID   Followed by  . [START ON 09/27/2019] phenobarbital  32.4 mg Oral TID   Followed by  . [START ON 09/29/2019] phenobarbital  16.2 mg Oral TID  . thiamine  100 mg Intravenous Daily   Continuous Infusions: . dexmedetomidine (PRECEDEX) IV infusion 1.2 mcg/kg/hr (09/26/19 0700)  . lactated ringers 75 mL/hr at 09/26/19 0700   PRN Meds: LORazepam, LORazepam **OR** [DISCONTINUED] LORazepam, metoprolol tartrate, ondansetron (ZOFRAN) IV, sodium chloride flush   Vital Signs    Vitals:   09/26/19 0330 09/26/19 0430 09/26/19 0500 09/26/19 0530  BP: (!) 158/122 (!) 167/110 (!) 182/123 (!) 173/128  Pulse: 88 88 83 90  Resp: 15 15 20 20   Temp:      TempSrc:      SpO2: 96% 94% 91% 93%  Weight:        Intake/Output Summary (Last 24 hours) at 09/26/2019 0911 Last data filed at 09/26/2019 0700 Gross per 24 hour  Intake 2857.96 ml  Output 925 ml  Net 1932.96 ml   Last 3 Weights 09/24/2019 07/16/2019 07/15/2019  Weight (lbs) 130 lb 132 lb 4.4 oz 133 lb 13.1 oz  Weight (kg) 58.968 kg 60 kg 60.7 kg      Telemetry    NSR now from AFIB- Personally Reviewed   Physical Exam   GEN: Confused in bed.   Neck: No JVD Cardiac: RRR, no murmurs, rubs, or gallops.  Respiratory: Clear to auscultation bilaterally. GI: Soft, nontender, non-distended  MS: No edema; No deformity.  Neuro:  Nonfocal  Psych: Normal affect   Labs    High Sensitivity Troponin:   Recent Labs  Lab 09/23/19 1414  TROPONINIHS 11      Chemistry Recent Labs  Lab 09/24/19 0504 09/25/19 0230 09/26/19 0123  NA 138 138 138  K 3.5 3.6 3.8  CL 103 102 102  CO2 26 26 23   GLUCOSE 93 116* 118*  BUN <5* <5* <5*  CREATININE 0.63 0.53* 0.49*  CALCIUM 8.5* 8.8* 8.9  PROT 6.3* 6.6 6.3*  ALBUMIN 2.7* 2.7* 2.7*  AST 105* 136* 188*  ALT 71* 87* 129*  ALKPHOS 82 70 80  BILITOT 0.9 1.1 1.0  GFRNONAA >60 >60 >60  GFRAA >60 >60 >60  ANIONGAP 9 10 13      Hematology Recent Labs  Lab 09/24/19 0504 09/25/19 0230 09/26/19 0123  WBC 7.2 7.1 6.9  RBC 4.06* 4.28 3.88*  HGB 13.3 14.0 12.8*  HCT 39.2 41.7 37.2*  MCV 96.6 97.4 95.9  MCH 32.8 32.7 33.0  MCHC 33.9 33.6 34.4  RDW 13.2 13.2 13.0  PLT 69* 75* 77*    BNPNo results for input(s): BNP, PROBNP in the last 168 hours.   DDimer No results for input(s): DDIMER in the last 168 hours.   Radiology  ECHOCARDIOGRAM COMPLETE  Result Date: 09/25/2019    ECHOCARDIOGRAM REPORT   Patient Name:   Vincent Peters Date of Exam: 09/25/2019 Medical Rec #:  [id number]                    Height: Accession #:    [id number]                   Weight: Date of Birth:  [birth year]                   BSA: Patient Age:    38 years                     BP:           90/62 mmHg Patient Gender: M                            HR:           111 bpm. Exam Location:  Inpatient Procedure: 2D Echo, Cardiac Doppler, Color Doppler and Intracardiac            Opacification Agent Indications:    Atrial Fibrillation 427.1/I48.9  History:        Patient has no prior history of Echocardiogram examinations.                 Signs/Symptoms:Altered Mental Status. Drug and alcohol abuse.  Sonographer:    Roosvelt Maser RDCS Referring Phys: [phone] MARK C SKAINS  Sonographer Comments: Image acquisition challenging due to uncooperative patient, Image acquisition challenging due to  patient behavioral factors. and no subcostal access. IMPRESSIONS  1. Left ventricular ejection fraction, by estimation, is 20 to 25%. The left ventricle has severely decreased function. The left ventricle demonstrates global hypokinesis. There is mild left ventricular hypertrophy. Left ventricular diastolic parameters  are indeterminate in the setting of atrial fibrillation/flutter.  2. Definity contrast utilized, no obvious LV mural thrombus.  3. Unable to assess RVSP. Right ventricular systolic function is reduced. The right ventricular size is normal.  4. The mitral valve is grossly normal. Mild mitral valve regurgitation.  5. The aortic valve is tricuspid. Aortic valve regurgitation is not visualized.  6. IVC not visualized. FINDINGS  Left Ventricle: Left ventricular ejection fraction, by estimation, is 20 to 25%. The left ventricle has severely decreased function. The left ventricle demonstrates global hypokinesis. Definity contrast agent was given IV to delineate the left ventricular endocardial borders. The left ventricular internal cavity size was normal in size. There is mild left ventricular hypertrophy. Left ventricular diastolic parameters are indeterminate. Right Ventricle: Unable to assess RVSP. The right ventricular size is normal. No increase in right ventricular wall thickness. Right ventricular systolic function is reduced. Left Atrium: Left atrial size was normal in size. Right Atrium: Right atrial size was normal in size. Pericardium: There is no evidence of pericardial effusion. Mitral Valve: The mitral valve is grossly normal. Mild mitral valve regurgitation. Tricuspid Valve: The tricuspid valve is grossly normal. Tricuspid valve regurgitation is trivial. Aortic Valve: The aortic valve is tricuspid. Aortic valve regurgitation is not visualized. Pulmonic Valve: The pulmonic valve was grossly normal. Pulmonic valve regurgitation is trivial. Aorta: The aortic root is normal in size and structure.  Venous: IVC not visualized. IAS/Shunts: No atrial level shunt detected by color flow Doppler.  TRICUSPID VALVE TR Peak grad:   16.0 mmHg TR Vmax:  200.00 cm/s Nona Dell MD Electronically signed by Nona Dell MD Signature Date/Time: 09/25/2019/11:44:07 AM    Final     Cardiac Studies   Echocardiogram 09/25/2019:  1. Left ventricular ejection fraction, by estimation, is 20 to 25%. The  left ventricle has severely decreased function. The left ventricle  demonstrates global hypokinesis. There is mild left ventricular  hypertrophy. Left ventricular diastolic parameters  are indeterminate in the setting of atrial fibrillation/flutter.  2. Definity contrast utilized, no obvious LV mural thrombus.  3. Unable to assess RVSP. Right ventricular systolic function is reduced.  The right ventricular size is normal.  4. The mitral valve is grossly normal. Mild mitral valve regurgitation.  5. The aortic valve is tricuspid. Aortic valve regurgitation is not  visualized.  6. IVC not visualized.   Patient Profile     39 y.o. male with newly diagnosed dilated cardiomyopathy likely tachycardia mediated in the setting of alcohol withdrawal delirium tremens, atrial fibrillation with rapid ventricular response  Assessment & Plan    Acute systolic heart failure/dilated cardiomyopathy tachycardia mediated likely etiology from atrial fibrillation rapid ventricular response -Heart rate is under better control currently with metoprolol.  Continue, but I will consolidate to 100 BID.  Avoid Cardizem as this can be a cardiodepressant and reduced contractile performance. -No evidence of pulmonary edema or hypoxia -monitor closely given reduced ejection fraction.  Thrombocytopenia -From heavy alcohol use  Alcohol withdrawal/delirium tremens -CIWA protocol  Atrial fibrillation with rapid ventricular response -Improved heart rate with metoprolol 50 4 times daily -CHA2DS2-VASc 2 for heart  failure and hypertension. -With the discovery of his congestive heart failure/dilated cardiomyopathy with reduced ejection fraction, he warrants anticoagulation. -We will begin Eliquis 5 mg twice a day.  Watch for any signs of bleeding with thrombocytopenia.  Given his current social situation with heavy alcohol use, he may not be an optimal candidate for long-term anticoagulation.       For questions or updates, please contact [facility] Please consult [email or web address] for contact info under        Signed, Donato Schultz, MD  09/26/2019, 9:11 AM

## 2019-09-26 NOTE — Progress Notes (Signed)
eLink Physician-Brief Progress Note Patient Name: Vincent Peters DOB: 22-Jul-1981 MRN: [id number]   Date of Service  09/26/2019  HPI/Events of Note  Notified of SBP 170s Refusing PO meds  eICU Interventions  Patient can be given metoprolol 5 mg IV for hypertension as ordered     Intervention Category Major Interventions: Hypertension - evaluation and management  Rosalie Gums Kobi Mario 09/26/2019, 7:02 AM

## 2019-09-26 NOTE — Progress Notes (Addendum)
Nursing staff found pt trying to get OOB, pt confused agitated noncompliant and incontinent of urine, saline lock lying in bed with catheter intact, gown and peripheral lines in disarray. 3 person nursing staff safely assisted pt back to center of bed

## 2019-09-26 NOTE — Progress Notes (Signed)
eLink Physician-Brief Progress Note Patient Name: Vincent Peters DOB: 28-May-1981 MRN: [id number]   Date of Service  09/26/2019  HPI/Events of Note  Notified of MG 1.5 Creatinine 0.49  eICU Interventions  Magnesium sulfate 1 g IV ordered     Intervention Category Major Interventions: Electrolyte abnormality - evaluation and management  Darl Pikes 09/26/2019, 4:56 AM

## 2019-09-26 NOTE — Progress Notes (Signed)
Vincent Peters, MRN:  [id number], DOB:  Nov 17, 1980, LOS: 3 ADMISSION DATE:  09/23/2019, CONSULTATION DATE:  2/19 REFERRING MD:  Alvino Chapel, CHIEF COMPLAINT:  Acute delirium presumed w/d & AF w/ RVR  Brief History   39 year old male heavy history of EtOH, last drink reported on 2/18.  Admitted 2/19 tremulous, slightly restless, and in narrow-complex tachycardia.  Critical care asked to admit Transiently  required Cardizem drip   Past Medical History  Alcohol abuse, prior seizure in the setting of withdrawal, accelerated hypertension, prior alcoholic hepatitis with hyperammonemia, thrombocytopenia, prior tachycardia  Significant Hospital Events   2/19 admitted with working diagnosis of DTs and narrow-complex tachycardia >> cardizem gtt 2/20 agitation requiring Precedex drip  Consults:  Cards 2/20  Procedures:  2/21 echo: LVEF 20-25%, RV reduced systolic function  Significant Diagnostic Tests:  Ammonia level 2/19:  84 UDS 2/19: + benzos Ethanol 2/19: 29, Mg 1.5, PLT 90, + anion gap metabolic acidosis   Micro Data:  2/19 sars2/flu: neg  Antimicrobials:    Interim history/subjective:  2/22: remains on precedex, given dose of phenobarb overnight. Poorly responsive this am with incoherent speech attempted. Protecting airway and laying flat without issue.  2/21:Afebrile Calmer on Precedex  Objective   Blood pressure (!) 173/128, pulse 90, temperature (!) 97.5 F (36.4 C), temperature source Oral, resp. rate 20, weight 59 kg, SpO2 93 %.        Intake/Output Summary (Last 24 hours) at 09/26/2019 1012 Last data filed at 09/26/2019 0700 Gross per 24 hour  Intake 2532.33 ml  Output 925 ml  Net 1607.33 ml   Filed Weights   09/24/19 1800  Weight: 59 kg    Examination: General: adult male , supine in bed, no distress HENT: [place]/AT, PERRL, no JVD, no icterus Lungs: Clear Cardiovascular: S1-S2 regular, tacky Abdomen: Soft, non-tender,  non-distended Extremities: No acute deformity or ROM limitation Neuro: Confused this morning, not interactive, nonfocal, mild tremors   Lab Results  Component Value Date   CREATININE 0.49 (L) 09/26/2019   BUN <5 (L) 09/26/2019   NA 138 09/26/2019   K 3.8 09/26/2019   CL 102 09/26/2019   CO2 23 09/26/2019   Lab Results  Component Value Date   WBC 6.9 09/26/2019   HGB 12.8 (L) 09/26/2019   HCT 37.2 (L) 09/26/2019   MCV 95.9 09/26/2019   PLT 77 (L) 09/26/2019     Resolved Hospital Problem list     Assessment & Plan:   Alcohol withdrawal: He has a known history of alcohol and cocaine abuse. History of ETOH withdrawal seizures and ICU admissions for Precedex.   -librium taper 2/2 eliquis (unable to utilize phenobarb) -  Ativan CIWA for breakthrough - -ct Precedex  Gtt but wean for rass 0 - Thiamine and folate - Multivitamin - Seizure precautions. Hypoklaemia: -replaced Hypomag:  replaced    Atrial fibrillation with RVR: rate initially in 300s. Given adenosine x 2, found to be AF and started on diltiazem infusion w/ bolus. Suspect this is driven by dehydration, withdrawal. . CHADSVASC 1.  - Telemetry  - Off Diltiazem infusion , use Lopressor 50 twice daily and titrate to rate control, use IV Lopressor as needed for breakthrough - IVF hydration -cards starting eliquis Acute DCM with LVEF 20-30% Acute HFrEF -management per cards  Hypertensive crisis:  -resolved  Anion gap acidosis:  -resolved   Transaminitis:  LFT remain elevated -u/s without cirrhosis Hyperammonemia:  - Lactulose   Thrombocytopenia-monitor esp with initiation of eliquis at  this time.   Best practice:  Diet: po when awake Pain/Anxiety/Delirium protocol (if indicated): CIWA protocol  VAP protocol (if indicated): NA DVT prophylaxis: eliquis GI prophylaxis: PPI Glucose control: SSI Mobility: BR w/ seizure precautions  Code Status: Full Code  Family Communication: Patient  Disposition:  Stay in ICU while on Precedex Critical care time: The patient is critically ill with multiple organ systems failure and requires high complexity decision making for assessment and support, frequent evaluation and titration of therapies, application of advanced monitoring technologies and extensive interpretation of multiple databases.  Critical care time 35 mins. This represents my time independent of the NPs time taking care of the pt. This is excluding procedures.    [name] DO [facility] 09/26/2019, 10:12 AM

## 2019-09-27 LAB — COMPREHENSIVE METABOLIC PANEL
ALT: 120 U/L — ABNORMAL HIGH (ref 0–44)
AST: 111 U/L — ABNORMAL HIGH (ref 15–41)
Albumin: 3 g/dL — ABNORMAL LOW (ref 3.5–5.0)
Alkaline Phosphatase: 92 U/L (ref 38–126)
Anion gap: 11 (ref 5–15)
BUN: <5 mg/dL — ABNORMAL LOW (ref 6–20)
CO2: 25 mmol/L (ref 22–32)
Calcium: 9 mg/dL (ref 8.9–10.3)
Chloride: 101 mmol/L (ref 98–111)
Creatinine, Ser: 0.56 mg/dL — ABNORMAL LOW (ref 0.61–1.24)
GFR calc Af Amer: >60 mL/min (ref >60)
GFR calc non Af Amer: >60 mL/min (ref >60)
Glucose, Bld: 102 mg/dL — ABNORMAL HIGH (ref 70–99)
Potassium: 3.1 mmol/L — ABNORMAL LOW (ref 3.5–5.1)
Sodium: 137 mmol/L (ref 135–145)
Total Bilirubin: 0.7 mg/dL (ref 0.3–1.2)
Total Protein: 7.1 g/dL (ref 6.5–8.1)

## 2019-09-27 LAB — TROPONIN I (HIGH SENSITIVITY): Troponin I (High Sensitivity): 16 ng/L (ref <18)

## 2019-09-27 LAB — CBC
HCT: 41.1 % (ref 39.0–52.0)
Hemoglobin: 14.2 g/dL (ref 13.0–17.0)
MCH: 32.7 pg (ref 26.0–34.0)
MCHC: 34.5 g/dL (ref 30.0–36.0)
MCV: 94.7 fL (ref 80.0–100.0)
Platelets: 112 10*3/uL — ABNORMAL LOW (ref 150–400)
RBC: 4.34 MIL/uL (ref 4.22–5.81)
RDW: 12.8 % (ref 11.5–15.5)
WBC: 10.1 10*3/uL (ref 4.0–10.5)
nRBC: 0 % (ref 0.0–0.2)

## 2019-09-27 LAB — GLUCOSE, CAPILLARY
Glucose-Capillary: 115 mg/dL — ABNORMAL HIGH (ref 70–99)
Glucose-Capillary: 89 mg/dL (ref 70–99)

## 2019-09-27 MED ORDER — THIAMINE HCL 100 MG/ML IJ SOLN: 100.0000 mg | Freq: Every day | INTRAMUSCULAR | Status: DC

## 2019-09-27 MED ORDER — ADULT MULTIVITAMIN W/MINERALS CH
1.0000 | ORAL_TABLET | Freq: Every day | ORAL | Status: DC
  Administered 2019-09-27 – 2019-09-29 (×3): 1 via ORAL
  Filled 2019-09-27 (×3): qty 1

## 2019-09-27 MED ORDER — LORAZEPAM 2 MG/ML IJ SOLN: 1.0000 mg | INTRAMUSCULAR | Status: DC | PRN

## 2019-09-27 MED ORDER — SACUBITRIL-VALSARTAN 49-51 MG PO TABS
1.0000 | ORAL_TABLET | Freq: Two times a day (BID) | ORAL | Status: DC
  Administered 2019-09-27 – 2019-09-29 (×5): 1 via ORAL
  Filled 2019-09-27 (×8): qty 1

## 2019-09-27 MED ORDER — THIAMINE HCL 100 MG PO TABS
100.0000 mg | ORAL_TABLET | Freq: Every day | ORAL | Status: DC
  Administered 2019-09-27 – 2019-09-29 (×3): 100 mg via ORAL
  Filled 2019-09-27 (×3): qty 1

## 2019-09-27 MED ORDER — POTASSIUM CHLORIDE CRYS ER 20 MEQ PO TBCR
30.0000 meq | EXTENDED_RELEASE_TABLET | ORAL | Status: AC
  Administered 2019-09-27 (×2): 30 meq via ORAL
  Filled 2019-09-27 (×2): qty 1

## 2019-09-27 MED ORDER — METOPROLOL SUCCINATE ER 100 MG PO TB24
200.0000 mg | ORAL_TABLET | Freq: Every day | ORAL | Status: DC
  Administered 2019-09-27 – 2019-09-29 (×3): 200 mg via ORAL
  Filled 2019-09-27: qty 2
  Filled 2019-09-27: qty 4
  Filled 2019-09-27: qty 2

## 2019-09-27 MED ORDER — LORAZEPAM 1 MG PO TABS
1.0000 mg | ORAL_TABLET | ORAL | Status: DC | PRN
  Administered 2019-09-27: 2 mg via ORAL
  Filled 2019-09-27: qty 2

## 2019-09-27 MED ORDER — FOLIC ACID 1 MG PO TABS
1.0000 mg | ORAL_TABLET | Freq: Every day | ORAL | Status: DC
  Administered 2019-09-27 – 2019-09-29 (×3): 1 mg via ORAL
  Filled 2019-09-27 (×3): qty 1

## 2019-09-27 NOTE — Progress Notes (Signed)
NAMEVikash Peters, MRN:  [id number], DOB:  15-Feb-1981, LOS: 4 ADMISSION DATE:  09/23/2019, CONSULTATION DATE:  2/19 REFERRING MD:  Alvino Chapel, CHIEF COMPLAINT:  Acute delirium presumed w/d & AF w/ RVR  Brief History   39 year old male heavy history of EtOH, last drink reported on 2/18.  Admitted 2/19 tremulous, slightly restless, and in narrow-complex tachycardia.  Critical care asked to admit Transiently  required Cardizem drip   Past Medical History  Alcohol abuse, prior seizure in the setting of withdrawal, accelerated hypertension, prior alcoholic hepatitis with hyperammonemia, thrombocytopenia, prior tachycardia  Significant Hospital Events   2/19 admitted with working diagnosis of DTs and narrow-complex tachycardia >> cardizem gtt 2/20 agitation requiring Precedex drip  Consults:  Cards 2/20  Procedures:  2/21 echo: LVEF 20-25%, RV reduced systolic function  Significant Diagnostic Tests:  Ammonia level 2/19:  84 UDS 2/19: + benzos Ethanol 2/19: 29, Mg 1.5, PLT 90, + anion gap metabolic acidosis   Micro Data:  2/19 sars2/flu: neg  Antimicrobials:    Interim history/subjective:  2/23: off precedex, still tremulous. Impulsive and intermittently confused but stable for floor. Able to converse with translator this am.  2/22: remains on precedex, given dose of phenobarb overnight. Poorly responsive this am with incoherent speech attempted. Protecting airway and laying flat without issue.  2/21:Afebrile Calmer on Precedex  Objective   Blood pressure (!) 162/109, pulse 85, temperature 97.7 F (36.5 C), temperature source Oral, resp. rate 20, weight 65.2 kg, SpO2 98 %.        Intake/Output Summary (Last 24 hours) at 09/27/2019 0842 Last data filed at 09/27/2019 0600 Gross per 24 hour  Intake 1950.39 ml  Output 4850 ml  Net -2899.61 ml   Filed Weights   09/24/19 1800 09/27/19 0500  Weight: 59 kg 65.2 kg    Examination: General: adult male ,  sitting up in bed eating breakfast, no distress HENT: [place]/AT, PERRL, no JVD, no icterus Lungs: Clear Cardiovascular: S1-S2 regular, tacky Abdomen: Soft, non-tender, non-distended Extremities: No acute deformity or ROM limitation Neuro: awake, alert and conversant.still tremulous   Lab Results  Component Value Date   CREATININE 0.56 (L) 09/27/2019   BUN <5 (L) 09/27/2019   NA 137 09/27/2019   K 3.1 (L) 09/27/2019   CL 101 09/27/2019   CO2 25 09/27/2019   Lab Results  Component Value Date   WBC 10.1 09/27/2019   HGB 14.2 09/27/2019   HCT 41.1 09/27/2019   MCV 94.7 09/27/2019   PLT 112 (L) 09/27/2019     Resolved Hospital Problem list     Assessment & Plan:   Alcohol withdrawal: He has a known history of alcohol and cocaine abuse. History of ETOH withdrawal seizures and ICU admissions for Precedex.   -librium taper 2/2 eliquis (unable to utilize phenobarb) -  Ativan CIWA for breakthrough - -off precedex - Thiamine and folate - Multivitamin - Seizure precautions. Hypoklaemia: -replaced Hypomag:  replaced    Atrial fibrillation with RVR: rate initially in 300s. Given adenosine x 2, found to be AF and started on diltiazem infusion w/ bolus. Suspect this is driven by dehydration, withdrawal. . CHADSVASC 1.  - Telemetry  - Off Diltiazem infusion , use Lopressor 50 twice daily and titrate to rate control, use IV Lopressor as needed for breakthrough - IVF hydration: stopping -cards starting eliquis Acute DCM with LVEF 20-30% Acute HFrEF -management per cards  Hypertensive crisis:  -resolved  Anion gap acidosis:  -resolved   Transaminitis:  LFT remain  elevated -u/s without cirrhosis Hyperammonemia:  - Lactulose   Thrombocytopenia-monitor esp with initiation of eliquis at this time.   Best practice:  Diet: po Pain/Anxiety/Delirium protocol (if indicated): CIWA protocol  VAP protocol (if indicated): NA DVT prophylaxis: eliquis GI prophylaxis: PPI Glucose  control: SSI Mobility: BR w/ seizure precautions  Code Status: Full Code  Family Communication: Patient  Disposition: floor. Will ask TRH to assume care and Ccm will sign off  [id number]   [name] DO [facility] 09/27/2019, 8:42 AM

## 2019-09-27 NOTE — Progress Notes (Signed)
I was notified from CCMD that pt was having some ST elevations measuring 3.1.  MD notified via phone.  EKG and troponin ordered.  Pt denies shortness of breath, dizziness, chest pain.  Will continue to monitor.

## 2019-09-27 NOTE — Progress Notes (Signed)
Progress Note  Patient Name: Vincent Peters Date of Encounter: 09/27/2019  Primary Cardiologist: No primary care provider on file. Fordyce Lepak   Subjective   Brushing his hair in bed.  Pleasant.  Less confused.  Inpatient Medications    Scheduled Meds: . apixaban  5 mg Oral BID  . chlordiazePOXIDE  25 mg Oral TID   Followed by  . [START ON 09/28/2019] chlordiazePOXIDE  25 mg Oral BH-qamhs   Followed by  . [START ON 09/29/2019] chlordiazePOXIDE  25 mg Oral Daily  . chlorhexidine  15 mL Mouth Rinse BID  . Chlorhexidine Gluconate Cloth  6 each Topical Daily  . folic acid  1 mg Oral Daily  . lactulose  20 g Per Tube BID  . mouth rinse  15 mL Mouth Rinse q12n4p  . metoprolol tartrate  100 mg Oral BID  . multivitamin with minerals  1 tablet Oral Daily  . pantoprazole (PROTONIX) IV  40 mg Intravenous Q24H  . potassium chloride  30 mEq Oral Q4H  . thiamine  100 mg Intravenous Daily   Continuous Infusions: . dexmedetomidine (PRECEDEX) IV infusion Stopped (09/26/19 1106)  . lactated ringers 75 mL/hr at 09/26/19 2146   PRN Meds: LORazepam, metoprolol tartrate, ondansetron (ZOFRAN) IV, sodium chloride flush   Vital Signs    Vitals:   09/27/19 0400 09/27/19 0500 09/27/19 0726 09/27/19 0730  BP: (!) 156/97 (!) 157/92  (!) 162/109  Pulse: 86   85  Resp: (!) 21 17  20   Temp:   97.7 F (36.5 C)   TempSrc:   Oral   SpO2: 94%   98%  Weight:  65.2 kg      Intake/Output Summary (Last 24 hours) at 09/27/2019 0852 Last data filed at 09/27/2019 0600 Gross per 24 hour  Intake 1950.39 ml  Output 4850 ml  Net -2899.61 ml   Last 3 Weights 09/27/2019 09/24/2019 07/16/2019  Weight (lbs) 143 lb 11.8 oz 130 lb 132 lb 4.4 oz  Weight (kg) 65.2 kg 58.968 kg 60 kg      Telemetry    Sinus rhythm.  No longer atrial fibrillation.  Has maintained greater than 24 hours.- Personally Reviewed  ECG    No new- Personally Reviewed  Physical Exam   GEN: No acute distress.   Neck: No  JVD Cardiac: RRR, no murmurs, rubs, or gallops.  Respiratory: Clear to auscultation bilaterally. GI: Soft, nontender, non-distended  MS: No edema; No deformity. Neuro:  Nonfocal  Psych: Normal affect   Labs    High Sensitivity Troponin:   Recent Labs  Lab 09/23/19 1414  TROPONINIHS 11      Chemistry Recent Labs  Lab 09/25/19 0230 09/26/19 0123 09/27/19 0333  NA 138 138 137  K 3.6 3.8 3.1*  CL 102 102 101  CO2 26 23 25   GLUCOSE 116* 118* 102*  BUN <5* <5* <5*  CREATININE 0.53* 0.49* 0.56*  CALCIUM 8.8* 8.9 9.0  PROT 6.6 6.3* 7.1  ALBUMIN 2.7* 2.7* 3.0*  AST 136* 188* 111*  ALT 87* 129* 120*  ALKPHOS 70 80 92  BILITOT 1.1 1.0 0.7  GFRNONAA >60 >60 >60  GFRAA >60 >60 >60  ANIONGAP 10 13 11      Hematology Recent Labs  Lab 09/25/19 0230 09/26/19 0123 09/27/19 0333  WBC 7.1 6.9 10.1  RBC 4.28 3.88* 4.34  HGB 14.0 12.8* 14.2  HCT 41.7 37.2* 41.1  MCV 97.4 95.9 94.7  MCH 32.7 33.0 32.7  MCHC 33.6 34.4 34.5  RDW  13.2 13.0 12.8  PLT 75* 77* 112*    BNPNo results for input(s): BNP, PROBNP in the last 168 hours.   DDimer No results for input(s): DDIMER in the last 168 hours.   Radiology    ECHOCARDIOGRAM COMPLETE  Result Date: 09/25/2019    ECHOCARDIOGRAM REPORT   Patient Name:   Vincent Peters Date of Exam: 09/25/2019 Medical Rec #:  [id number]                    Height: Accession #:    [id number]                   Weight: Date of Birth:  11/24/1980                   BSA: Patient Age:    39 years                     BP:           90/62 mmHg Patient Gender: M                            HR:           111 bpm. Exam Location:  Inpatient Procedure: 2D Echo, Cardiac Doppler, Color Doppler and Intracardiac            Opacification Agent Indications:    Atrial Fibrillation 427.1/I48.9  History:        Patient has no prior history of Echocardiogram examinations.                 Signs/Symptoms:Altered Mental Status. Drug and alcohol abuse.  Sonographer:     Merrie Roof RDCS Referring Phys: [phone] Sholom Dulude C Makhi Muzquiz  Sonographer Comments: Image acquisition challenging due to uncooperative patient, Image acquisition challenging due to patient behavioral factors. and no subcostal access. IMPRESSIONS  1. Left ventricular ejection fraction, by estimation, is 20 to 25%. The left ventricle has severely decreased function. The left ventricle demonstrates global hypokinesis. There is mild left ventricular hypertrophy. Left ventricular diastolic parameters  are indeterminate in the setting of atrial fibrillation/flutter.  2. Definity contrast utilized, no obvious LV mural thrombus.  3. Unable to assess RVSP. Right ventricular systolic function is reduced. The right ventricular size is normal.  4. The mitral valve is grossly normal. Mild mitral valve regurgitation.  5. The aortic valve is tricuspid. Aortic valve regurgitation is not visualized.  6. IVC not visualized. FINDINGS  Left Ventricle: Left ventricular ejection fraction, by estimation, is 20 to 25%. The left ventricle has severely decreased function. The left ventricle demonstrates global hypokinesis. Definity contrast agent was given IV to delineate the left ventricular endocardial borders. The left ventricular internal cavity size was normal in size. There is mild left ventricular hypertrophy. Left ventricular diastolic parameters are indeterminate. Right Ventricle: Unable to assess RVSP. The right ventricular size is normal. No increase in right ventricular wall thickness. Right ventricular systolic function is reduced. Left Atrium: Left atrial size was normal in size. Right Atrium: Right atrial size was normal in size. Pericardium: There is no evidence of pericardial effusion. Mitral Valve: The mitral valve is grossly normal. Mild mitral valve regurgitation. Tricuspid Valve: The tricuspid valve is grossly normal. Tricuspid valve regurgitation is trivial. Aortic Valve: The aortic valve is tricuspid. Aortic valve regurgitation  is not visualized. Pulmonic Valve: The pulmonic valve was grossly normal. Pulmonic valve regurgitation is trivial. Aorta:  The aortic root is normal in size and structure. Venous: IVC not visualized. IAS/Shunts: No atrial level shunt detected by color flow Doppler.  TRICUSPID VALVE TR Peak grad:   16.0 mmHg TR Vmax:        200.00 cm/s Nona Dell MD Electronically signed by Nona Dell MD Signature Date/Time: 09/25/2019/11:44:07 AM    Final     Cardiac Studies   Echo EF 25%  Patient Profile     39 y.o. male with alcohol-related/tachycardia related cardiomyopathy here with alcohol withdrawal, paroxysmal atrial fibrillation with rapid ventricular spots now in sinus rhythm.  Assessment & Plan    Paroxysmal atrial fibrillation -Given CHA2DS2-VASc of 2 for heart failure and hypertension we will continue with Eliquis 5 mg twice a day.  Hopefully he will be able to take this as an outpatient.  Social Designer, industrial/product to assist.  Dilated cardiomyopathy likely secondary to tachycardia or perhaps alcohol-related -Metoprolol.  I will go ahead and consolidate once again to Toprol-XL 200 mg once a day for ease of use. -Given his ongoing hypertension, I will start Entresto 49/51 p.o. twice daily.  Once again case manager to assist with possible usage as an outpatient. -I would recommend in 3 months repeating echocardiogram.  Chronic anticoagulation -Eliquis can be less effective in the setting of phenobarbital but phenobarbital has been stopped.  Appreciate pharmacy's assistance.  Please continue with Eliquis.  Thankfully currently in sinus rhythm.  Alcohol withdrawal -Improving.      For questions or updates, please contact [facility] Please consult [email or web address] for contact info under        Signed, Donato Schultz, MD  09/27/2019, 8:52 AM

## 2019-09-27 NOTE — Progress Notes (Signed)
Pt refuse lab draw and pulled out his peripheral IV, became agitated when told that we will put a new one, Ativan tablet given for agitattion.

## 2019-09-27 NOTE — Progress Notes (Signed)
Phlebotomist reported that pt still refuse lab draw, Dr. Arsenio Loader is aware.

## 2019-09-27 NOTE — Progress Notes (Signed)
[facility] ADULT ICU REPLACEMENT PROTOCOL FOR AM LAB REPLACEMENT ONLY  The patient does apply for the [facility] Adult ICU Electrolyte Replacment Protocol based on the criteria listed below:   1. Is GFR >/= 40 ml/min? Yes.    Patient's GFR today is >60 2. Is urine output >/= 0.5 ml/kg/hr for the last 6 hours? Yes.   Patient's UOP is 5.64 ml/kg/hr 3. Is BUN < 60 mg/dL? Yes.    Patient's BUN today is <5 4. Abnormal electrolyte(s): K+3.1 5. Ordered repletion with: protocol 6. If a panic level lab has been reported, has the CCM MD in charge been notified? Yes.  .   Physician:  Dr.Sommer  Lolita Lenz 09/27/2019 4:48 AM

## 2019-09-28 ENCOUNTER — Other Ambulatory Visit: Payer: Self-pay

## 2019-09-28 ENCOUNTER — Encounter (HOSPITAL_COMMUNITY): Payer: Self-pay | Admitting: Pulmonary Disease

## 2019-09-28 DIAGNOSIS — D696 Thrombocytopenia, unspecified: Secondary | ICD-10-CM

## 2019-09-28 DIAGNOSIS — R7989 Other specified abnormal findings of blood chemistry: Secondary | ICD-10-CM

## 2019-09-28 DIAGNOSIS — I169 Hypertensive crisis, unspecified: Secondary | ICD-10-CM

## 2019-09-28 DIAGNOSIS — E876 Hypokalemia: Secondary | ICD-10-CM

## 2019-09-28 DIAGNOSIS — I420 Dilated cardiomyopathy: Secondary | ICD-10-CM

## 2019-09-28 MED ORDER — MAGNESIUM SULFATE 2 GM/50ML IV SOLN
2.0000 g | Freq: Once | INTRAVENOUS | Status: AC
  Administered 2019-09-28: 2 g via INTRAVENOUS
  Filled 2019-09-28: qty 50

## 2019-09-28 MED ORDER — POTASSIUM CHLORIDE 20 MEQ PO PACK
20.0000 meq | PACK | Freq: Once | ORAL | Status: AC
  Administered 2019-09-28: 20 meq via ORAL
  Filled 2019-09-28: qty 1

## 2019-09-28 MED ORDER — ONDANSETRON HCL 4 MG PO TABS
4.0000 mg | ORAL_TABLET | Freq: Three times a day (TID) | ORAL | Status: DC | PRN
  Administered 2019-09-28: 4 mg via ORAL
  Filled 2019-09-28: qty 1

## 2019-09-28 MED ORDER — PANTOPRAZOLE SODIUM 40 MG PO TBEC
40.0000 mg | DELAYED_RELEASE_TABLET | Freq: Every day | ORAL | Status: DC
  Administered 2019-09-28 – 2019-09-29 (×2): 40 mg via ORAL
  Filled 2019-09-28 (×2): qty 1

## 2019-09-28 MED ORDER — LACTULOSE 10 GM/15ML PO SOLN
20.0000 g | Freq: Every day | ORAL | Status: DC
  Administered 2019-09-29: 20 g
  Filled 2019-09-28: qty 30

## 2019-09-28 NOTE — Progress Notes (Addendum)
PROGRESS NOTE    Vincent Peters  UXL:[id number]  DOB: [birth year]  PCP: Patient, No Pcp Per Admit date:09/23/2019  39 year old male  history of polysubstance abuse-cocaine and EtOH ( last drink reported on 2/18), prior seizure in the setting of withdrawal, accelerated hypertension, prior alcoholic hepatitis with hyperammonemia, thrombocytopenia,admitted to PCCM service on 2/19 for acute alcohol withdrawal with tremulousness, slightly restless, and noted to be in narrow-complex tachycardia.Transiently required Cardizem drip. On 2/20 worsening withdrawal symptoms/agitated requiring Precedex drip. On 2/22: received a dose of phenobarb. Taken off Precedex on 2/23 although still tremulous. impulsive and intermittently confused, on librium now and able to converse with translator, felt stable for floor transfer and TRH requested to assume service today.   Subjective:  Patient appears comfortable and in no acute distress.  Oriented x3 today.  He speaks English reasonably well.  He states he felt somewhat ataxic while walking to the bathroom.  He states he is motivated to quit alcohol and understands he is on a blood thinner.  Lives with uncle.  Has a sister who lives in [place].  Objective: Vitals:   09/27/19 1500 09/27/19 1611 09/27/19 1828 09/27/19 2215  BP: (!) 152/95 (!) 145/101 (!) 139/98 (!) 140/98  Pulse: 72 75 81 79  Resp: 20 18 17 20   Temp:  97.8 F (36.6 C)  98.7 F (37.1 C)  TempSrc:  Axillary  Oral  SpO2: 97% 100% 100% 99%  Weight:        Intake/Output Summary (Last 24 hours) at 09/28/2019 0843 Last data filed at 09/28/2019 0659 Gross per 24 hour  Intake 600 ml  Output 2702 ml  Net -2102 ml   Filed Weights   09/24/19 1800 09/27/19 0500  Weight: 59 kg 65.2 kg    Physical Examination:  General exam: Appears calm and comfortable  Respiratory system: Clear to auscultation. Respiratory effort normal. Cardiovascular system: S1 & S2 heard, RRR. No JVD, murmurs,  rubs, gallops or clicks. No pedal edema. Gastrointestinal system: Abdomen is nondistended, soft and nontender. Normal bowel sounds heard. Central nervous system: Alert and oriented. No new focal neurological deficits.Very mild intentional tremors but no asterixis or other signs of withdrawal. Extremities: No contractures, edema or joint deformities.   Skin: No rashes, lesions or ulcers Psychiatry: Judgement and insight appear normal. Mood & affect appropriate.   Data Reviewed: I have personally reviewed following labs and imaging studies  CBC: Recent Labs  Lab 09/23/19 1414 09/24/19 0504 09/25/19 0230 09/26/19 0123 09/27/19 0333  WBC 10.5 7.2 7.1 6.9 10.1  NEUTROABS 8.7*  --   --   --   --   HGB 14.5 13.3 14.0 12.8* 14.2  HCT 43.3 39.2 41.7 37.2* 41.1  MCV 97.1 96.6 97.4 95.9 94.7  PLT 90* 69* 75* 77* 536*   Basic Metabolic Panel: Recent Labs  Lab 09/23/19 1414 09/23/19 1414 09/23/19 1900 09/23/19 2048 09/24/19 0504 09/25/19 0230 09/26/19 0123 09/27/19 0333  NA 138   < > 134*  --  138 138 138 137  K 3.7   < > 3.6  --  3.5 3.6 3.8 3.1*  CL 99   < > 99  --  103 102 102 101  CO2 17*   < > 23  --  26 26 23 25   GLUCOSE 160*   < > 115*  --  93 116* 118* 102*  BUN <5*   < > <5*  --  <5* <5* <5* <5*  CREATININE 0.75   < > 0.53*  --  0.63 0.53* 0.49* 0.56*  CALCIUM 8.9   < > 8.2*  --  8.5* 8.8* 8.9 9.0  MG 1.5*  --   --  2.2 1.9 1.6* 1.5*  --   PHOS  --   --  2.7  --  2.9  --  4.7*  --    < > = values in this interval not displayed.   GFR: Estimated Creatinine Clearance: 116.6 mL/min (A) (by C-G formula based on SCr of 0.56 mg/dL (L)). Liver Function Tests: Recent Labs  Lab 09/23/19 1900 09/24/19 0504 09/25/19 0230 09/26/19 0123 09/27/19 0333  AST 134* 105* 136* 188* 111*  ALT 84* 71* 87* 129* 120*  ALKPHOS 89 82 70 80 92  BILITOT 1.2 0.9 1.1 1.0 0.7  PROT 6.8 6.3* 6.6 6.3* 7.1  ALBUMIN 3.0* 2.7* 2.7* 2.7* 3.0*   No results for input(s): LIPASE, AMYLASE in the  last 168 hours. Recent Labs  Lab 09/23/19 1414  AMMONIA 84*   Coagulation Profile: Recent Labs  Lab 09/23/19 1940  INR 1.0   Cardiac Enzymes: Recent Labs  Lab 09/23/19 1900  CKTOTAL 621*   BNP (last 3 results) No results for input(s): PROBNP in the last 8760 hours. HbA1C: No results for input(s): HGBA1C in the last 72 hours. CBG: Recent Labs  Lab 09/26/19 1536 09/26/19 1921 09/26/19 2311 09/27/19 0320 09/27/19 0720  GLUCAP 89 132* 116* 115* 89   Lipid Profile: No results for input(s): CHOL, HDL, LDLCALC, TRIG, CHOLHDL, LDLDIRECT in the last 72 hours. Thyroid Function Tests: No results for input(s): TSH, T4TOTAL, FREET4, T3FREE, THYROIDAB in the last 72 hours. Anemia Panel: No results for input(s): VITAMINB12, FOLATE, FERRITIN, TIBC, IRON, RETICCTPCT in the last 72 hours. Sepsis Labs: Recent Labs  Lab 09/23/19 1915 09/23/19 2048  LATICACIDVEN 0.9 1.1    Recent Results (from the past 240 hour(s))  Respiratory Panel by RT PCR (Flu A&B, Covid) - Nasopharyngeal Swab     Status: None   Collection Time: 09/23/19  3:25 PM   Specimen: Nasopharyngeal Swab  Result Value Ref Range Status   SARS Coronavirus 2 by RT PCR NEGATIVE NEGATIVE Final    Comment: (NOTE) SARS-CoV-2 target nucleic acids are NOT DETECTED. The SARS-CoV-2 RNA is generally detectable in upper respiratoy specimens during the acute phase of infection. The lowest concentration of SARS-CoV-2 viral copies this assay can detect is 131 copies/mL. A negative result does not preclude SARS-Cov-2 infection and should not be used as the sole basis for treatment or other patient management decisions. A negative result may occur with  improper specimen collection/handling, submission of specimen other than nasopharyngeal swab, presence of viral mutation(s) within the areas targeted by this assay, and inadequate number of viral copies (<131 copies/mL). A negative result must be combined with  clinical observations, patient history, and epidemiological information. The expected result is Negative. Fact Sheet for Patients:  https://www.moore.com/ Fact Sheet for Healthcare Providers:  https://www.young.biz/ This test is not yet ap proved or cleared by the [place] FDA and  has been authorized for detection and/or diagnosis of SARS-CoV-2 by FDA under an Emergency Use Authorization (EUA). This EUA will remain  in effect (meaning this test can be used) for the duration of the COVID-19 declaration under Section 564(b)(1) of the Act, 21 U.S.C. section 360bbb-3(b)(1), unless the authorization is terminated or revoked sooner.    Influenza A by PCR NEGATIVE NEGATIVE Final   Influenza B by PCR NEGATIVE NEGATIVE Final    Comment: (NOTE) The Xpert Xpress  SARS-CoV-2/FLU/RSV assay is intended as an aid in  the diagnosis of influenza from Nasopharyngeal swab specimens and  should not be used as a sole basis for treatment. Nasal washings and  aspirates are unacceptable for Xpert Xpress SARS-CoV-2/FLU/RSV  testing. Fact Sheet for Patients: https://www.moore.com/ Fact Sheet for Healthcare Providers: https://www.young.biz/ This test is not yet approved or cleared by the [place] FDA and  has been authorized for detection and/or diagnosis of SARS-CoV-2 by  FDA under an Emergency Use Authorization (EUA). This EUA will remain  in effect (meaning this test can be used) for the duration of the  Covid-19 declaration under Section 564(b)(1) of the Act, 21  U.S.C. section 360bbb-3(b)(1), unless the authorization is  terminated or revoked. Performed at [facility], [address], [place], [place] [address]       Radiology Studies: No results found.      Scheduled Meds: . apixaban  5 mg Oral BID  . chlordiazePOXIDE  25 mg Oral BH-qamhs   Followed by  . [START ON 09/29/2019] chlordiazePOXIDE   25 mg Oral Daily  . chlorhexidine  15 mL Mouth Rinse BID  . Chlorhexidine Gluconate Cloth  6 each Topical Daily  . folic acid  1 mg Oral Daily  . lactulose  20 g Per Tube BID  . mouth rinse  15 mL Mouth Rinse q12n4p  . metoprolol succinate  200 mg Oral Daily  . multivitamin with minerals  1 tablet Oral Daily  . pantoprazole (PROTONIX) IV  40 mg Intravenous Q24H  . sacubitril-valsartan  1 tablet Oral BID  . thiamine  100 mg Oral Daily   Or  . thiamine  100 mg Intravenous Daily    Assessment/Plan: Alcohol withdrawal:-off Precedex drip now. Has h/o ETOH withdrawal seizures and ICU admissions for Precedex. Continue Librium taper. Remains on CIWA for breakthrough - Thiamine/Folate/Multivitamin- Seizure precautions.Appears motivated to quit-TOC consult for substance abuse referrals..  Atrial fibrillation with RVR: Tachycardic with HR initially in 300s. Given adenosine x 2, found to be AF and started on diltiazem infusion w/ bolus. Suspect this is driven by dehydration, withdrawal. Seen by cardiology during ICU course. Now Off Diltiazem infusion , use Lopressor 50 twice daily and titrate to rate control, use IV Lopressor as needed for breakthrough. Off IVF and cardiology recommended anticoagulation with Eliquis given CHA2DS2-VASc of 2 for heart failure and hypertension. Hopefully he will tolerate this and be compliant as an outpatient.  Social Designer, industrial/product to assist.  Dilated cardiomyopathy with LVEF 20-30%--started on  Cardiac remodeling agents-Toprol-XL 200 Entresto 49/51 p.o. twice daily by cardiology who recommends 3 months therapy (CM assisting with medication filling) and  repeating echocardiogram. No signs of acute cardiac failure.  Started on [facility] by cardiology.  Hypertensive crisis: -resolved.  Now on Entresto in addition to metoprolol and cardiology adjusting according to blood pressure tolerance.  Transaminitis: Likely due to alcoholic hepatitis. HIV/Hepatitis profile negative  in 04/2019. LFT remain elevated-u/s without cirrhosis  Hyperammonemia- in the setting of alcohol use/heaptitis.  On lactulose twice daily-change to daily and monitor response.  Thrombocytopenia-related to alcohol use possibly, baseline Plt count 180--> 97 on admission-->51 --> 81-->97-->136 ->186 -improving spontaneously monitor while on Eliquis.  Hypokalemia/hypomagnesemia: Potassium level at 3.2 and magnesium at 1.5 yesterday.  Labs ordered for this morning but still not drawn.  Will order potassium supplementation 20 mEq and IV magnesium.  Follow-up serial labs  DVT prophylaxis: Eliquis Code Status: Full code Family / Patient Communication: Discussed with patient in detail  regarding current hospital course, need for chronic anticoagulation and risk of bleeding with ongoing alcohol abuse.  He appears motivated to quit. Disposition Plan:   Patient is from home prior to hospitalization. Received/Receiving inpatient care for alcohol withdrawal/ A fib with RVR, new systolic CM Discharge when mental/functional status back to baseline, librium tapered, electrolytes replaced and no further CIWA requirement or cardiac complications. Hopefully in next 24-48 hrs       LOS: 5 days    Time spent:     Alessandra Bevels, MD [facility] Pager in [place]  If 7PM-7AM, please contact night-coverage [email or web address] 09/28/2019, 8:43 AM

## 2019-09-28 NOTE — Progress Notes (Signed)
Progress Note  Patient Name: Vincent Peters Date of Encounter: 09/28/2019  Primary Cardiologist: Donato Schultz, MD   Subjective   Pleasant, laying comfortably in bed.  More lucid.  Smiling.  Inpatient Medications    Scheduled Meds: . apixaban  5 mg Oral BID  . chlordiazePOXIDE  25 mg Oral BH-qamhs   Followed by  . [START ON 09/29/2019] chlordiazePOXIDE  25 mg Oral Daily  . chlorhexidine  15 mL Mouth Rinse BID  . Chlorhexidine Gluconate Cloth  6 each Topical Daily  . folic acid  1 mg Oral Daily  . lactulose  20 g Per Tube BID  . mouth rinse  15 mL Mouth Rinse q12n4p  . metoprolol succinate  200 mg Oral Daily  . multivitamin with minerals  1 tablet Oral Daily  . pantoprazole (PROTONIX) IV  40 mg Intravenous Q24H  . sacubitril-valsartan  1 tablet Oral BID  . thiamine  100 mg Oral Daily   Or  . thiamine  100 mg Intravenous Daily   Continuous Infusions:  PRN Meds: LORazepam **OR** LORazepam, metoprolol tartrate, ondansetron (ZOFRAN) IV, sodium chloride flush   Vital Signs    Vitals:   09/27/19 1500 09/27/19 1611 09/27/19 1828 09/27/19 2215  BP: (!) 152/95 (!) 145/101 (!) 139/98 (!) 140/98  Pulse: 72 75 81 79  Resp: 20 18 17 20   Temp:  97.8 F (36.6 C)  98.7 F (37.1 C)  TempSrc:  Axillary  Oral  SpO2: 97% 100% 100% 99%  Weight:        Intake/Output Summary (Last 24 hours) at 09/28/2019 1050 Last data filed at 09/28/2019 0659 Gross per 24 hour  Intake 600 ml  Output 1952 ml  Net -1352 ml   Last 3 Weights 09/27/2019 09/24/2019 07/16/2019  Weight (lbs) 143 lb 11.8 oz 130 lb 132 lb 4.4 oz  Weight (kg) 65.2 kg 58.968 kg 60 kg      Telemetry    Sinus rhythm no further A. fib- Personally Reviewed  ECG    No new- Personally Reviewed  Physical Exam   GEN: No acute distress.   Neck: No JVD Cardiac: RRR, no murmurs, rubs, or gallops.  Respiratory: Clear to auscultation bilaterally. GI: Soft, nontender, non-distended  MS: No edema; No deformity.  Neuro:  Nonfocal  Psych: Normal affect   Labs    High Sensitivity Troponin:   Recent Labs  Lab 09/23/19 1414 09/27/19 1755  TROPONINIHS 11 16      Chemistry Recent Labs  Lab 09/25/19 0230 09/26/19 0123 09/27/19 0333  NA 138 138 137  K 3.6 3.8 3.1*  CL 102 102 101  CO2 26 23 25   GLUCOSE 116* 118* 102*  BUN <5* <5* <5*  CREATININE 0.53* 0.49* 0.56*  CALCIUM 8.8* 8.9 9.0  PROT 6.6 6.3* 7.1  ALBUMIN 2.7* 2.7* 3.0*  AST 136* 188* 111*  ALT 87* 129* 120*  ALKPHOS 70 80 92  BILITOT 1.1 1.0 0.7  GFRNONAA >60 >60 >60  GFRAA >60 >60 >60  ANIONGAP 10 13 11      Hematology Recent Labs  Lab 09/25/19 0230 09/26/19 0123 09/27/19 0333  WBC 7.1 6.9 10.1  RBC 4.28 3.88* 4.34  HGB 14.0 12.8* 14.2  HCT 41.7 37.2* 41.1  MCV 97.4 95.9 94.7  MCH 32.7 33.0 32.7  MCHC 33.6 34.4 34.5  RDW 13.2 13.0 12.8  PLT 75* 77* 112*    BNPNo results for input(s): BNP, PROBNP in the last 168 hours.   DDimer No results for  input(s): DDIMER in the last 168 hours.   Radiology    No results found.  Cardiac Studies   EF 25%  Patient Profile     39 y.o. 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 -Metoprolol 200 once a day, Entresto 49/51 dose.  Blood pressure under better control.  Excellent urine output.  If blood pressure still elevated tomorrow, will increase Entresto to maximum dose. -Eliquis for atrial fibrillation, CHA2DS2-VASc 2. -CIWA protocol. -Needs case manager help for medications.        For questions or updates, please contact [place] Please consult [email or web address] for contact info under         Signed, Candee Furbish, MD  09/28/2019, 10:50 AM

## 2019-09-28 NOTE — Progress Notes (Signed)
Pt refuses new PIV placement. Alessandra Bevels, MD aware. Will continue to monitor.

## 2019-09-28 NOTE — Care Management (Addendum)
Patient currently remains disoriented and not appropriate for interview to assess and discuss resources, potential plans for disposition, or ability to provide self care. TOC will continue to follow.   Per chart patient used [facility] in December 2020, he will not be eligible for [facility], also nephew came and picked him up.   Will use [facility] for discharge meds. Will provide 30 free card for Eliquis and assistance application. Patient will need follow up appointment with a PCP or cardiology for Eliquis .   Scheduled follow up appointment at [facility] for October 03, 2019 at 0930   Will continue to follow.   Ronny Flurry RN

## 2019-09-28 NOTE — Discharge Instructions (Signed)

## 2019-09-28 NOTE — Plan of Care (Signed)
  Problem: Education: Goal: Knowledge of General Education information will improve Description: Including pain rating scale, medication(s)/side effects and non-pharmacologic comfort measures Outcome: Progressing   Problem: Health Behavior/Discharge Planning: Goal: Ability to manage health-related needs will improve Outcome: Progressing   Problem: Clinical Measurements: Goal: Ability to maintain clinical measurements within normal limits will improve Outcome: Progressing Goal: Respiratory complications will improve Outcome: Progressing Goal: Cardiovascular complication will be avoided Outcome: Progressing   Problem: Elimination: Goal: Will not experience complications related to urinary retention Outcome: Progressing   Problem: Pain Managment: Goal: General experience of comfort will improve Outcome: Progressing   Problem: Safety: Goal: Ability to remain free from injury will improve Outcome: Progressing   Problem: Skin Integrity: Goal: Risk for impaired skin integrity will decrease Outcome: Progressing   

## 2019-09-28 NOTE — Social Work (Signed)
CSW acknowledging consult for substance use.  Will evaluate pt needs and interest in resources. Pt currently still remains disoriented will continue to follow.   Octavio Graves, MSW, LCSW [facility] Clinical Social Work

## 2019-09-29 LAB — COMPREHENSIVE METABOLIC PANEL
ALT: 130 U/L — ABNORMAL HIGH (ref 0–44)
AST: 103 U/L — ABNORMAL HIGH (ref 15–41)
Albumin: 2.7 g/dL — ABNORMAL LOW (ref 3.5–5.0)
Alkaline Phosphatase: 84 U/L (ref 38–126)
Anion gap: 9 (ref 5–15)
BUN: 9 mg/dL (ref 6–20)
CO2: 24 mmol/L (ref 22–32)
Calcium: 8.7 mg/dL — ABNORMAL LOW (ref 8.9–10.3)
Chloride: 103 mmol/L (ref 98–111)
Creatinine, Ser: 0.61 mg/dL (ref 0.61–1.24)
GFR calc Af Amer: >60 mL/min (ref >60)
GFR calc non Af Amer: >60 mL/min (ref >60)
Glucose, Bld: 90 mg/dL (ref 70–99)
Potassium: 3.7 mmol/L (ref 3.5–5.1)
Sodium: 136 mmol/L (ref 135–145)
Total Bilirubin: 0.6 mg/dL (ref 0.3–1.2)
Total Protein: 6.3 g/dL — ABNORMAL LOW (ref 6.5–8.1)

## 2019-09-29 LAB — CBC
HCT: 48.3 % (ref 39.0–52.0)
Hemoglobin: 16.1 g/dL (ref 13.0–17.0)
MCH: 32.5 pg (ref 26.0–34.0)
MCHC: 33.3 g/dL (ref 30.0–36.0)
MCV: 97.6 fL (ref 80.0–100.0)
Platelets: 176 10*3/uL (ref 150–400)
RBC: 4.95 MIL/uL (ref 4.22–5.81)
RDW: 13.1 % (ref 11.5–15.5)
WBC: 8.8 10*3/uL (ref 4.0–10.5)
nRBC: 0 % (ref 0.0–0.2)

## 2019-09-29 LAB — MAGNESIUM: Magnesium: 1.9 mg/dL (ref 1.7–2.4)

## 2019-09-29 LAB — AMMONIA: Ammonia: 82 umol/L — ABNORMAL HIGH (ref 9–35)

## 2019-09-29 MED ORDER — LACTULOSE 10 GM/15ML PO SOLN: 20.0000 g | Freq: Every day | ORAL | Status: DC

## 2019-09-29 MED ORDER — POTASSIUM CHLORIDE CRYS ER 20 MEQ PO TBCR: 40.0000 meq | EXTENDED_RELEASE_TABLET | Freq: Two times a day (BID) | ORAL | Status: DC

## 2019-09-29 MED ORDER — LACTULOSE 10 GM/15ML PO SOLN: 20.0000 g | Freq: Every day | ORAL | 0 refills | Status: DC

## 2019-09-29 MED ORDER — APIXABAN 5 MG PO TABS: 5.0000 mg | ORAL_TABLET | Freq: Two times a day (BID) | ORAL | 1 refills | Status: DC

## 2019-09-29 MED ORDER — FOLIC ACID 1 MG PO TABS: 1.0000 mg | ORAL_TABLET | Freq: Every day | ORAL | Status: AC

## 2019-09-29 MED ORDER — SACUBITRIL-VALSARTAN 49-51 MG PO TABS: 1.0000 | ORAL_TABLET | Freq: Two times a day (BID) | ORAL | 1 refills | Status: DC

## 2019-09-29 MED ORDER — METOPROLOL SUCCINATE ER 200 MG PO TB24: 200.0000 mg | ORAL_TABLET | Freq: Every day | ORAL | 2 refills | Status: DC

## 2019-09-29 MED ORDER — ONDANSETRON HCL 4 MG PO TABS: 4.0000 mg | ORAL_TABLET | Freq: Three times a day (TID) | ORAL | 0 refills | Status: DC | PRN

## 2019-09-29 MED ORDER — ADULT MULTIVITAMIN W/MINERALS CH: 1.0000 | ORAL_TABLET | Freq: Every day | ORAL | Status: AC

## 2019-09-29 MED ORDER — PANTOPRAZOLE SODIUM 40 MG PO TBEC: 40.0000 mg | DELAYED_RELEASE_TABLET | Freq: Every day | ORAL | 1 refills | Status: DC

## 2019-09-29 MED ORDER — THIAMINE HCL 100 MG PO TABS: 100.0000 mg | ORAL_TABLET | Freq: Every day | ORAL | 0 refills | Status: AC

## 2019-09-29 MED FILL — ENTRESTO 49 MG-51 MG TABLET: 49-51 | 30 days supply | Qty: 60 | Fill #0

## 2019-09-29 MED FILL — METOPROLOL SUCCINATE ER 200: 200 | 30 days supply | Qty: 30 | Fill #0

## 2019-09-29 MED FILL — LACTULOSE 10 GM/15 ML SOLN: 10 | 30 days supply | Qty: 946 | Fill #0

## 2019-09-29 MED FILL — ONDANSETRON HCL 4 MG TABLET: 4 | 7 days supply | Qty: 20 | Fill #0

## 2019-09-29 MED FILL — VITAMIN B-1 100 MG TABS: 100 | 30 days supply | Qty: 30 | Fill #0

## 2019-09-29 MED FILL — ELIQUIS 5 MG TABLET: 5 | 30 days supply | Qty: 60 | Fill #0

## 2019-09-29 NOTE — Evaluation (Signed)
Physical Therapy Evaluation Patient Details Name: Vincent Peters MRN: [id number] DOB: Jun 27, 1981 Today's Date: 09/29/2019   History of Present Illness  39 yo admitted with acute alcohol withdrawal with alcohol related dilated cardiomyopathy and tachycardia. PMhx: polysubstance abuse, seizure, HTN, hepatitis  Clinical Impression  Pt supine on arrival with feet hanging over the foot board. Pt communicating in English throughout session and reports his uncle is staying with him and that he works in [employer] as Scientist, water quality. Pt educated for impact of drinking on balance and need to cease drinking in order to truly correct balance deficits. Pt reports he will have sufficient family assist for return home. Pt with decreased balance, gait and function who will benefit from acute therapy to maximize mobility, function and safety to prevent falls.      Follow Up Recommendations Supervision for mobility/OOB;Outpatient PT    Equipment Recommendations  None recommended by PT    Recommendations for Other Services       Precautions / Restrictions Precautions Precautions: Fall Restrictions Weight Bearing Restrictions: No      Mobility  Bed Mobility Overal bed mobility: Independent             General bed mobility comments: Pt seated in bedside chair upon OT arrival  Transfers Overall transfer level: Modified independent Equipment used: None Transfers: Sit to/from [employer] Sit to Stand: Supervision Stand pivot transfers: Supervision       General transfer comment: Supervision to ensure balance and safety with lines.  Ambulation/Gait Ambulation/Gait assistance: Supervision Gait Distance (Feet): 500 Feet Assistive device: None Gait Pattern/deviations: Step-through pattern;Decreased stride length   Gait velocity interpretation: >2.62 ft/sec, indicative of community ambulatory General Gait Details: pt with generally steady gait with one LOB  with turning and 2 periods of slight sway with pt able to recover without physical assist  Stairs Stairs: Yes Stairs assistance: Modified independent (Device/Increase time) Stair Management: One rail Left;Alternating pattern;Forwards Number of Stairs: 3    Wheelchair Mobility    Modified Rankin (Stroke Patients Only)       Balance Overall balance assessment: Needs assistance   Sitting balance-Leahy Scale: Good       Standing balance-Leahy Scale: Good   Single Leg Stance - Right Leg: 3 Single Leg Stance - Left Leg: 3     Rhomberg - Eyes Opened: 15 [place]berg - Eyes Closed: 5   High Level Balance Comments: pt with higher level balance deficits and 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, water quality at times on scaffolding             Pertinent Vitals/Pain Pain Assessment: No/denies pain Pain Score: (pt unable to rate) Pain Location: abdomen Pain Descriptors / Indicators: Aching Pain Intervention(s): Monitored during session;Repositioned    Home Living Family/patient expects to be discharged to:: Private residence Living Arrangements: Other relatives Available Help at Discharge: Family;Available PRN/intermittently Type of Home: House Home Access: Stairs to enter   [employer] of Steps: 1 Home Layout: One level Home Equipment: None      Prior Function Level of Independence: Independent         Comments: works in Psychologist, counselling   Dominant Hand: Left    Extremity/Trunk Assessment   Upper Extremity Assessment Upper Extremity Assessment: Overall WFL for tasks assessed    Lower Extremity Assessment Lower Extremity Assessment: Overall WFL for tasks assessed    Cervical / Trunk Assessment Cervical / Trunk Assessment: Normal  Communication   Communication: Other (comment)(primary language is Spanish  but speaks [place])  Cognition Arousal/Alertness: Awake/alert Behavior During Therapy: WFL for tasks assessed/performed Overall Cognitive  Status: Within Functional Limits for tasks assessed                                 General Comments: Pt slightly impulsive requiring cues for safety      General Comments General comments (skin integrity, edema, etc.): slightly impulsive at times with cues for safety    Exercises     Assessment/Plan    PT Assessment Patient needs continued PT services  PT Problem List Decreased mobility;Decreased safety awareness;Decreased balance;Decreased activity tolerance       PT Treatment Interventions Gait training;Balance training;Functional mobility training;Therapeutic activities;Patient/family education;Neuromuscular re-education;Therapeutic exercise    PT Goals (Current goals can be found in the Care Plan section)  Acute Rehab PT Goals Patient Stated Goal: to go home PT Goal Formulation: With patient Time For Goal Achievement: 10/13/19 Potential to Achieve Goals: Fair    Frequency Min 3X/week   Barriers to discharge Decreased caregiver support      Co-evaluation               AM-PAC PT "6 Clicks" Mobility  Outcome Measure Help needed turning from your back to your side while in a flat bed without using bedrails?: None Help needed moving from lying on your back to sitting on the side of a flat bed without using bedrails?: None Help needed moving to and from a bed to a chair (including a wheelchair)?: A Little Help needed standing up from a chair using your arms (e.g., wheelchair or bedside chair)?: A Little Help needed to walk in hospital room?: A Little Help needed climbing 3-5 steps with a railing? : A Little 6 Click Score: 20    End of Session Equipment Utilized During Treatment: Gait belt Activity Tolerance: Patient tolerated treatment well Patient left: in chair;with call bell/phone within reach;with chair alarm set Nurse Communication: Mobility status PT Visit Diagnosis: Other abnormalities of gait and mobility (R26.89);History of falling  (Z91.81)    Time: 2778-2423 PT Time Calculation (min) (ACUTE ONLY): 18 min   Charges:   PT Evaluation $PT Eval Moderate Complexity: 1 Mod          Jentri Aye P, PT Acute Rehabilitation Services Pager: [phone] Office: [phone]   Hogan Hoobler B Nakyia Dau 09/29/2019, 11:51 AM

## 2019-09-29 NOTE — Progress Notes (Signed)
Occupational Therapy Evaluation Patient Details Name: Vincent Peters MRN: [id number] DOB: 05-27-1981 Today's Date: 09/29/2019    History of Present Illness 39 yo admitted with acute alcohol withdrawal with alcohol related dilated cardiomyopathy and tachycardia. PMhx: polysubstance abuse, seizure, HTN, hepatitis   Clinical Impression   PTA pt lived with family, independent in all ADLs, IADLs, and mobility. Pt does not ambulate with an assistive device and reports 0 falls in the last 6 months. Pt still drives and works in [employer]. Pt currently independent to min guard for self-care and functional transfer tasks. Pt able to ambulate to/from bathroom without an assistive device, noting several instances of loss of balance with pt able to self-correct. Pt completed toileting task noting 1 instance of retro loss of balance during transfer. Educated pt on safety strategies with poor to fair understanding and follow through. Pt slightly impulsive during session requiring cues for safety and activity pacing. Pt demonstrates decreased endurance, balance, standing tolerance, activity tolerance, and safety awareness impacting ability to complete self-care and functional transfer tasks. Recommend skilled OT services to address above deficits in order to promote function and prevent further decline.     Follow Up Recommendations  No OT follow up;Supervision - Intermittent    Equipment Recommendations  3 in 1 bedside commode    Recommendations for Other Services       Precautions / Restrictions Precautions Precautions: Fall Restrictions Weight Bearing Restrictions: No      Mobility Bed Mobility               General bed mobility comments: Pt seated in bedside chair upon OT arrival  Transfers Overall transfer level: Needs assistance Equipment used: None Transfers: Sit to/from Stand;Stand Pivot Transfers Sit to Stand: Supervision Stand pivot transfers: Supervision        General transfer comment: Supervision to ensure balance and safety with lines.    Balance Overall balance assessment: Mild deficits observed, not formally tested                                         ADL either performed or assessed with clinical judgement   ADL Overall ADL's : Needs assistance/impaired Eating/Feeding: Independent;Sitting   Grooming: Set up;Supervision/safety;Standing;Wash/dry hands;Wash/dry face;Oral care   Upper Body Bathing: Set up;Supervision/ safety;Sitting   Lower Body Bathing: Set up;Supervison/ safety;Sit to/from stand   Upper Body Dressing : Set up;Supervision/safety;Sitting   Lower Body Dressing: Set up;Supervision/safety;Sit to/from stand;Sitting/lateral leans   Toilet Transfer: Supervision/safety;Ambulation;Regular Teacher, adult education Details (indicate cue type and reason): Noted 1 instance of retro LOB with pt able to self-correct. Toileting- Clothing Manipulation and Hygiene: Supervision/safety;Sit to/from stand       Functional mobility during ADLs: Supervision/safety;Min guard General ADL Comments: Pt able to ambulate to/from bathroom without an assistive device. Noted several instances of LOB with pt able to self-correct.      Vision Baseline Vision/History: No visual deficits       Perception     Praxis      Pertinent Vitals/Pain Pain Assessment: 0-10 Pain Score: (pt unable to rate) Pain Location: abdomen Pain Descriptors / Indicators: Aching Pain Intervention(s): Monitored during session;Repositioned     Hand Dominance Left   Extremity/Trunk Assessment Upper Extremity Assessment Upper Extremity Assessment: Overall WFL for tasks assessed   Lower Extremity Assessment Lower Extremity Assessment: Defer to PT evaluation       Communication Communication Communication: Other (comment)(Spanish  speaking, but does speak some [place])   Cognition Arousal/Alertness: Awake/alert Behavior During Therapy:  WFL for tasks assessed/performed Overall Cognitive Status: No family/caregiver present to determine baseline cognitive functioning                                 General Comments: Pt slightly impulsive requiring cues for safety   General Comments  Pt required mod cues for safety. Pt slightly impulsive, impacting balance with mobility and transfers.     Exercises     Shoulder Instructions      Home Living Family/patient expects to be discharged to:: Private residence Living Arrangements: Other relatives Available Help at Discharge: Family;Available PRN/intermittently Type of Home: House Home Access: Stairs to enter [employer] of Steps: 1   Home Layout: One level     Bathroom Shower/Tub: Teacher, early years/pre: Standard     Home Equipment: None          Prior Functioning/Environment Level of Independence: Independent        Comments: Pt independent in ADLs, IADLs, and mobility. Pt does not ambulate with an assistive device and reports 0 falls in the last 6 months. Pt still drives and works in [employer].         OT Problem List: Decreased activity tolerance;Impaired balance (sitting and/or standing);Decreased safety awareness      OT Treatment/Interventions: Self-care/ADL training;Therapeutic exercise;Neuromuscular education;Energy conservation;DME and/or AE instruction;Therapeutic activities;Patient/family education;Balance training    OT Goals(Current goals can be found in the care plan section) Acute Rehab OT Goals Patient Stated Goal: to go home Time For Goal Achievement: 10/13/19 Potential to Achieve Goals: Good ADL Goals Pt Will Perform Tub/Shower Transfer: Tub transfer;Independently;ambulating Additional ADL Goal #1: Pt to recall and verbalize 3 fall prevention strategies with 0 verbal cues. Additional ADL Goal #2: Pt to complete all ADLs independently with 0 verbal cues for safety. Additional ADL Goal #3: Pt to  tolerate standing up to 10 minutes independently, in preparation for ADLs.  OT Frequency: Min 3X/week   Barriers to D/C:            Co-evaluation              AM-PAC OT "6 Clicks" Daily Activity     Outcome Measure Help from another person eating meals?: None Help from another person taking care of personal grooming?: A Little Help from another person toileting, which includes using toliet, bedpan, or urinal?: A Little Help from another person bathing (including washing, rinsing, drying)?: A Little Help from another person to put on and taking off regular upper body clothing?: A Little Help from another person to put on and taking off regular lower body clothing?: A Little 6 Click Score: 19   End of Session Nurse Communication: Mobility status  Activity Tolerance: Patient tolerated treatment well Patient left: in chair;with call bell/phone within reach;with chair alarm set  OT Visit Diagnosis: Unsteadiness on feet (R26.81)                Time: 3419-6222 OT Time Calculation (min): 17 min Charges:  OT General Charges $OT Visit: 1 Visit OT Evaluation $OT Eval Low Complexity: 1 Low  Mauri Brooklyn OTR/L [phone]  Mauri Brooklyn 09/29/2019, 10:44 AM

## 2019-09-29 NOTE — TOC Initial Note (Signed)
Transition of Care [facility]) - Initial/Assessment Note    Patient Details  Name: Vincent Peters MRN: [id number] Date of Birth: 07-08-1981  [facility] CM/SW Contact:    Marilu Favre, RN Phone Number: 09/29/2019, 2:15 PM  Clinical Narrative:                 Confirmed face sheet information with patient at bedside. He currently does not have a phone. His uncle lives with him and has a phone but patient does not have that number.   PT recommended OP PT, explained to patient to arrange OP PT will need a contact number. Patient will ask his uncle for phone number.   Provided 30 free card for Eliquis , if patient discharges on a week day [facility] pharmacy will fill prescriptions.   Scheduled appointment at [facility] for October 03, 2019. Provided Eliquis assistance application to patient and told him to take it with him to follow up appointment.   Patient voiced understanding to all of above.  Expected Discharge Plan: Home/Self Care Barriers to Discharge: Continued Medical Work up   Patient Goals and CMS Choice Patient states their goals for this hospitalization and ongoing recovery are:: to go home CMS Medicare.gov Compare Post Acute Care list provided to:: Patient Choice offered to / list presented to : NA  Expected Discharge Plan and Services Expected Discharge Plan: Home/Self Care   Discharge Planning Services: CM Consult, [facility], Medication Assistance, [place] [facility]   Living arrangements for the past 2 months: Single Family Home                 DME Arranged: N/A         HH Arranged: NA          Prior Living Arrangements/Services Living arrangements for the past 2 months: Single Family Home Lives with:: Relatives(Uncle) Patient language and need for interpreter reviewed:: Yes Do you feel safe going back to the place where you live?: Yes      Need for Family Participation in Patient Care: Yes (Comment) Care  giver support system in place?: Yes (comment)   Criminal Activity/Legal Involvement Pertinent to Current Situation/Hospitalization: No - Comment as needed  Activities of Daily Living Home Assistive Devices/Equipment: None ADL Screening (condition at time of admission) Patient's cognitive ability adequate to safely complete daily activities?: Yes Is the patient deaf or have difficulty hearing?: No Does the patient have difficulty seeing, even when wearing glasses/contacts?: No Does the patient have difficulty concentrating, remembering, or making decisions?: No Patient able to express need for assistance with ADLs?: Yes Does the patient have difficulty dressing or bathing?: No Independently performs ADLs?: Yes (appropriate for developmental age) Does the patient have difficulty walking or climbing stairs?: Yes Weakness of Legs: Both Weakness of Arms/Hands: None  Permission Sought/Granted   Permission granted to share information with : No              Emotional Assessment Appearance:: Appears younger than stated age Attitude/Demeanor/Rapport: Engaged Affect (typically observed): Accepting Orientation: : Oriented to Self, Oriented to Place, Oriented to  Time, Oriented to Situation Alcohol / Substance Use: Not Applicable Psych Involvement: No (comment)  Admission diagnosis:  Atrial fibrillation with rapid ventricular response (HCC) [I48.91] Alcohol withdrawal syndrome without complication ([place]) [Q76.195] Alcohol withdrawal delirium, acute, hyperactive ([place]) [F10.231] Patient Active Problem List   Diagnosis Date Noted  . Hypertensive crisis 09/28/2019  . Dilated cardiomyopathy ([place]) 09/28/2019  . Hypomagnesemia 09/28/2019  .  Alcohol withdrawal delirium, acute, hyperactive (HCC) 09/23/2019  . Elevated LFTs   . Withdrawal symptoms, alcohol (HCC) 07/06/2019  . Alcohol abuse with alcohol-induced mood disorder (HCC) 02/26/2018  . Alcohol withdrawal (HCC) 02/10/2014  .  Hypokalemia 02/10/2014  . Thrombocytopenia (HCC) 02/10/2014  . Hypercalcemia 02/10/2014  . Delirium tremens (HCC) 02/10/2014   PCP:  Patient, No Pcp Per Pharmacy:   [facility] - [place], [place] - [address] [place] [address] Phone: [phone] Fax: [phone]  [id number] - [place], [place] - [address] AT [facility] [address] [place] [address] Phone: [phone] Fax: [phone]  [facility] [place], [place] - [address] [place] [address] Phone: [phone] Fax: [phone]     Social Determinants of Health (SDOH) Interventions    Readmission Risk Interventions No flowsheet data found.

## 2019-09-29 NOTE — Discharge Summary (Signed)
Physician Discharge Summary  Vincent Peters ZOX:[id number] DOB: 1980-11-04 DOA: 09/23/2019  PCP: Patient, No Pcp Per  Admit date: 09/23/2019 Discharge date: 09/29/2019 Consultations: Cardiology Dr Anne Fu, PCCM  Admitted From: home Disposition: home  Discharge Diagnoses:  Principal Problem:   Alcohol withdrawal delirium, acute, hyperactive (HCC) Active Problems:   Hypokalemia   Thrombocytopenia (HCC)   Elevated LFTs   Hypertensive crisis   Dilated cardiomyopathy (HCC)   Hypomagnesemia   Hospital Course Summary: 39 year old male  history of polysubstance abuse-cocaine and EtOH ( last drink reported on 2/18), prior seizure in the setting of withdrawal, accelerated hypertension, prior alcoholic hepatitis with hyperammonemia, thrombocytopenia,admitted to PCCM service on 2/19 for acute alcohol withdrawal with tremulousness, slightly restless, and noted to be in narrow-complex tachycardia.Transiently required Cardizem drip.  He was evaluated by cardiology during the hospital course.  Echocardiogram showed systolic cardiomyopathy with reduced EF at 20- 30%.  On 2/20, patient had worsening withdrawal symptoms/agitated requiring Precedex drip. On 2/22: received a dose of phenobarb. Taken off Precedex on 2/23, managed with tapering doses of librium by PCCM, felt stable for floor transfer and [facility] assumed care on 09/28/19.   Alcohol withdrawal:-Taken off Precedex drip and maintain Librium taper when transferred to medical floor.  He is tolerated Librium taper well and received last dose this morning. Has h/o ETOH withdrawal seizures and ICU admissions for Precedex in the past.  He was prescribed- Thiamine/Folate/Multivitamin as well as lactulose in the past (possibly had hepatic encephalopathy) but was not taking any of his medications.  He has no PCP and cannot afford medications.  He was started on lactulose twice daily while in ICU per home medication regimen as his ammonia level was up in  80s-advised patient to take at least once daily on discharge.  New prescriptions provided.Appears motivated to quit-TOC consult for substance abuse referrals.He states he is motivated to quit alcohol and understands he is on a blood thinner.Lives with uncle.  Has a sister who lives in [place]. . Atrial fibrillation with RVR: Tachycardic with HR initially in 300s. Given adenosine x 2, found to be AF and started on diltiazem infusion w/ bolus. Suspect this is driven by dehydration, withdrawal. Seen by cardiology during ICU course. Now Off Diltiazem infusion , use Lopressor 50 twice daily and titrate to rate control, use IV Lopressor as needed for breakthrough. Off IVF and cardiology recommended anticoagulation with Eliquis given CHA2DS2-VASc of 2 for heart failure and hypertension. Hopefully he will tolerate this and be compliant as an outpatient. Social Designer, industrial/product assisting with prescriptions.  Dilated cardiomyopathy with LVEF 20-30%--started on  Cardiac remodeling agents-Toprol-XL 200 Entresto 49/51 p.o. twice daily by cardiology who recommends 3 months therapy (CM assisting with medication filling) and  repeating echocardiogram. No signs of acute cardiac failure.  Started on Entresto by cardiology and will need close follow-up with them as outpatient-appointment being scheduled  Hypertensive crisis: -resolved.  Now on Entresto in addition to metoprolol and cardiology adjusting according to blood pressure tolerance.  Transaminitis/hyperammonemia:Likely due to alcoholic hepatitis. HIV/Hepatitis profile negative in 04/2019. LFT remain elevated-u/s without cirrhosis.  Lactulose prescribed but doubt compliance  Thrombocytopenia-related to alcohol use possibly, baseline Plt count 180--> 97 on admission-->51 --> 81-->97-->136 ->186 -improving spontaneously, monitor periodically while on Eliquis.  Patient advised of bleeding risk with continued alcohol use.  Hypokalemia/hypomagnesemia: Replaced  p.o. and IV while here.  Discharge Exam:  Vitals:   09/29/19 0815 09/29/19 1359  BP:  110/69  Pulse: 94 80  Resp:  16  Temp:  98.6 F (37 C)  SpO2:  100%   Vitals:   09/28/19 2102 09/29/19 0449 09/29/19 0815 09/29/19 1359  BP: 106/73 112/70  110/69  Pulse: 89 81 94 80  Resp: 17 19  16   Temp: 98.8 F (37.1 C) 98.3 F (36.8 C)  98.6 F (37 C)  TempSrc: Oral Oral  Oral  SpO2: 98% 98%  100%  Weight:        General: Pt is alert, awake, not in acute distress Cardiovascular: RRR, S1/S2 +, no rubs, no gallops Respiratory: CTA bilaterally, no wheezing, no rhonchi Abdominal: Soft, NT, ND, bowel sounds + Extremities: no edema, no cyanosis  Discharge Condition:Stable CODE STATUS: Full code Diet recommendation: Heart healthy diet Activity recommendation: Patient seen by PT prior to discharge and recommended outpatient PT referral-TOC team working on it.  Patient advised to stay out of work (works in [employer]) until cleared by PCP/PT and fall risk reduced especially since he is on blood thinners now.  I personally explained this to the patient and advised to hold Eliquis and seek immediate medical attention if notices any signs of bleeding. Recommendations for Outpatient Follow-up:  1. Follow up with [facility]CP[facility] 2. Follow up with consultants: Dr. Anne FuSkains with cardiology as scheduled in 1 to 2 weeks 3. Please obtain follow up labs including: CBC, BMP and magnesium level  Home Health services upon discharge: Outpatient PT referral Equipment/Devices upon discharge: None   Discharge Instructions:  Discharge Instructions    Call MD for:   Complete by: As directed    palpitations   Call MD for:  difficulty breathing, headache or visual disturbances   Complete by: As directed    Call MD for:  extreme fatigue   Complete by: As directed    Call MD for:  persistant dizziness or light-headedness   Complete by: As directed    Call MD for:  severe uncontrolled  pain   Complete by: As directed    Call MD for:  temperature >100.4   Complete by: As directed    Diet - low sodium heart healthy   Complete by: As directed    Increase activity slowly   Complete by: As directed      Allergies as of 09/29/2019   No Known Allergies     Medication List    STOP taking these medications   amLODipine 5 MG tablet Commonly known as: NORVASC     TAKE these medications   apixaban 5 MG Tabs tablet Commonly known as: ELIQUIS Take 1 tablet (5 mg total) by mouth 2 (two) times daily.   folic acid 1 MG tablet Commonly known as: FOLVITE Take 1 tablet (1 mg total) by mouth daily.   lactulose 10 GM/15ML solution Commonly known as: CHRONULAC Take 30 mLs (20 g total) by mouth daily. What changed: when to take this   metoprolol 200 MG 24 hr tablet Commonly known as: TOPROL-XL Take 1 tablet (200 mg total) by mouth daily. Take with or immediately following a meal. Start taking on: September 30, 2019   multivitamin with minerals Tabs tablet Take 1 tablet by mouth daily.   ondansetron 4 MG tablet Commonly known as: ZOFRAN Take 1 tablet (4 mg total) by mouth every 8 (eight) hours as needed for nausea or vomiting.   pantoprazole 40 MG tablet Commonly known as: PROTONIX Take 1 tablet (40 mg total) by mouth daily. What changed: when to take this   sacubitril-valsartan 49-51 MG Commonly known as:  ENTRESTO Take 1 tablet by mouth 2 (two) times daily.   thiamine 100 MG tablet Take 1 tablet (100 mg total) by mouth daily.      Follow-up Information     [facility]. Go to.   Why: Monday October 03, 2019 at 0930  Contact information: [address] [place] [address] [phone]       Rosalio Macadamia, NP Follow up.   Specialties: Nurse Practitioner, Interventional Cardiology, Cardiology, Radiology Why: Hospital follow-up schedule for 10/17/2019 at 8:15am with Archie Patten, one of Dr. Judd Gaudier PA's. If  this date/time does not work for you, please call our office to reschedule.  Contact information: [address] [place] [address] [phone]          No Known Allergies    The results of significant diagnostics from this hospitalization (including imaging, microbiology, ancillary and laboratory) are listed below for reference.    Labs: BNP (last 3 results) No results for input(s): BNP in the last 8760 hours. Basic Metabolic Panel: Recent Labs  Lab 09/23/19 1414 09/23/19 1900 09/23/19 1900 09/23/19 2048 09/24/19 0504 09/25/19 0230 09/26/19 0123 09/27/19 0333 09/29/19 0750 09/29/19 1200  NA  --  134*   < >  --  138 138 138 137 136  --   K  --  3.6   < >  --  3.5 3.6 3.8 3.1* 3.7  --   CL  --  99   < >  --  103 102 102 101 103  --   CO2  --  23   < >  --  26 26 23 25 24   --   GLUCOSE  --  115*   < >  --  93 116* 118* 102* 90  --   BUN  --  <5*   < >  --  <5* <5* <5* <5* 9  --   CREATININE  --  0.53*   < >  --  0.63 0.53* 0.49* 0.56* 0.61  --   CALCIUM  --  8.2*   < >  --  8.5* 8.8* 8.9 9.0 8.7*  --   MG   < >  --   --  2.2 1.9 1.6* 1.5*  --   --  1.9  PHOS  --  2.7  --   --  2.9  --  4.7*  --   --   --    < > = values in this interval not displayed.   Liver Function Tests: Recent Labs  Lab 09/24/19 0504 09/25/19 0230 09/26/19 0123 09/27/19 0333 09/29/19 0750  AST 105* 136* 188* 111* 103*  ALT 71* 87* 129* 120* 130*  ALKPHOS 82 70 80 92 84  BILITOT 0.9 1.1 1.0 0.7 0.6  PROT 6.3* 6.6 6.3* 7.1 6.3*  ALBUMIN 2.7* 2.7* 2.7* 3.0* 2.7*   No results for input(s): LIPASE, AMYLASE in the last 168 hours. Recent Labs  Lab 09/23/19 1414 09/29/19 1200  AMMONIA 84* 82*   CBC: Recent Labs  Lab 09/23/19 1414 09/23/19 1414 09/24/19 0504 09/25/19 0230 09/26/19 0123 09/27/19 0333 09/29/19 0750  WBC 10.5   < > 7.2 7.1 6.9 10.1 8.8  NEUTROABS 8.7*  --   --   --   --   --   --   HGB 14.5   < > 13.3 14.0 12.8* 14.2 16.1  HCT 43.3   < > 39.2  41.7 37.2* 41.1  48.3  MCV 97.1   < > 96.6 97.4 95.9 94.7 97.6  PLT 90*   < > 69* 75* 77* 112* 176   < > = values in this interval not displayed.   Cardiac Enzymes: Recent Labs  Lab 09/23/19 1900  CKTOTAL 621*   BNP: Invalid input(s): POCBNP CBG: Recent Labs  Lab 09/26/19 1536 09/26/19 1921 09/26/19 2311 09/27/19 0320 09/27/19 0720  GLUCAP 89 132* 116* 115* 89   D-Dimer No results for input(s): DDIMER in the last 72 hours. Hgb A1c No results for input(s): HGBA1C in the last 72 hours. Lipid Profile No results for input(s): CHOL, HDL, LDLCALC, TRIG, CHOLHDL, LDLDIRECT in the last 72 hours. Thyroid function studies No results for input(s): TSH, T4TOTAL, T3FREE, THYROIDAB in the last 72 hours.  Invalid input(s): FREET3 Anemia work up No results for input(s): VITAMINB12, FOLATE, FERRITIN, TIBC, IRON, RETICCTPCT in the last 72 hours. Urinalysis    Component Value Date/Time   COLORURINE YELLOW 09/23/2019 1416   APPEARANCEUR CLEAR 09/23/2019 1416   LABSPEC 1.012 09/23/2019 1416   PHURINE 7.0 09/23/2019 1416   GLUCOSEU 50 (A) 09/23/2019 1416   HGBUR MODERATE (A) 09/23/2019 1416   BILIRUBINUR NEGATIVE 09/23/2019 1416   KETONESUR 20 (A) 09/23/2019 1416   PROTEINUR >=300 (A) 09/23/2019 1416   UROBILINOGEN 0.2 01/17/2014 0136   NITRITE NEGATIVE 09/23/2019 1416   LEUKOCYTESUR NEGATIVE 09/23/2019 1416   Sepsis Labs Invalid input(s): PROCALCITONIN,  WBC,  LACTICIDVEN Microbiology Recent Results (from the past 240 hour(s))  Respiratory Panel by RT PCR (Flu A&B, Covid) - Nasopharyngeal Swab     Status: None   Collection Time: 09/23/19  3:25 PM   Specimen: Nasopharyngeal Swab  Result Value Ref Range Status   SARS Coronavirus 2 by RT PCR NEGATIVE NEGATIVE Final    Comment: (NOTE) SARS-CoV-2 target nucleic acids are NOT DETECTED. The SARS-CoV-2 RNA is generally detectable in upper respiratoy specimens during the acute phase of infection. The lowest concentration of  SARS-CoV-2 viral copies this assay can detect is 131 copies/mL. A negative result does not preclude SARS-Cov-2 infection and should not be used as the sole basis for treatment or other patient management decisions. A negative result may occur with  improper specimen collection/handling, submission of specimen other than nasopharyngeal swab, presence of viral mutation(s) within the areas targeted by this assay, and inadequate number of viral copies (<131 copies/mL). A negative result must be combined with clinical observations, patient history, and epidemiological information. The expected result is Negative. Fact Sheet for Patients:  https://www.moore.com/ Fact Sheet for Healthcare Providers:  https://www.young.biz/ This test is not yet ap proved or cleared by the [place] FDA and  has been authorized for detection and/or diagnosis of SARS-CoV-2 by FDA under an Emergency Use Authorization (EUA). This EUA will remain  in effect (meaning this test can be used) for the duration of the COVID-19 declaration under Section 564(b)(1) of the Act, 21 U.S.C. section 360bbb-3(b)(1), unless the authorization is terminated or revoked sooner.    Influenza A by PCR NEGATIVE NEGATIVE Final   Influenza B by PCR NEGATIVE NEGATIVE Final    Comment: (NOTE) The Xpert Xpress SARS-CoV-2/FLU/RSV assay is intended as an aid in  the diagnosis of influenza from Nasopharyngeal swab specimens and  should not be used as a sole basis for treatment. Nasal washings and  aspirates are unacceptable for Xpert Xpress SARS-CoV-2/FLU/RSV  testing. Fact Sheet for Patients: https://www.moore.com/ Fact Sheet for Healthcare Providers: https://www.young.biz/ This test is not yet approved or  cleared by the [place] and  has been authorized for detection and/or diagnosis of SARS-CoV-2 by  FDA under an Emergency Use Authorization (EUA).  This EUA will remain  in effect (meaning this test can be used) for the duration of the  Covid-19 declaration under Section 564(b)(1) of the Act, 21  U.S.C. section 360bbb-3(b)(1), unless the authorization is  terminated or revoked. Performed at [facility], [address], [place], [place] [address]     Procedures/Studies: DG Chest Portable 1 View  Result Date: 09/23/2019 CLINICAL DATA:  Shortness of breath with tremors, nausea, vomiting and diaphoresis. EXAM: PORTABLE CHEST 1 VIEW COMPARISON:  04/25/2019 FINDINGS: Lungs are somewhat hypoinflated but otherwise clear. Cardiomediastinal silhouette and remainder of the exam is unchanged. IMPRESSION: No active disease. Electronically Signed   By: Elberta Fortis M.D.   On: 09/23/2019 14:51   ECHOCARDIOGRAM COMPLETE  Result Date: 09/25/2019    ECHOCARDIOGRAM REPORT   Patient Name:   Vincent Peters Date of Exam: 09/25/2019 Medical Rec #:  [id number]                    Height: Accession #:    [id number]                   Weight: Date of Birth:  15-Apr-1981                   BSA: Patient Age:    38 years                     BP:           90/62 mmHg Patient Gender: M                            HR:           111 bpm. Exam Location:  Inpatient Procedure: 2D Echo, Cardiac Doppler, Color Doppler and Intracardiac            Opacification Agent Indications:    Atrial Fibrillation 427.1/I48.9  History:        Patient has no prior history of Echocardiogram examinations.                 Signs/Symptoms:Altered Mental Status. Drug and alcohol abuse.  Sonographer:    Roosvelt Maser RDCS Referring Phys: [phone] MARK C SKAINS  Sonographer Comments: Image acquisition challenging due to uncooperative patient, Image acquisition challenging due to patient behavioral factors. and no subcostal access. IMPRESSIONS  1. Left ventricular ejection fraction, by estimation, is 20 to 25%. The left ventricle has severely decreased function. The left ventricle demonstrates  global hypokinesis. There is mild left ventricular hypertrophy. Left ventricular diastolic parameters  are indeterminate in the setting of atrial fibrillation/flutter.  2. Definity contrast utilized, no obvious LV mural thrombus.  3. Unable to assess RVSP. Right ventricular systolic function is reduced. The right ventricular size is normal.  4. The mitral valve is grossly normal. Mild mitral valve regurgitation.  5. The aortic valve is tricuspid. Aortic valve regurgitation is not visualized.  6. IVC not visualized. FINDINGS  Left Ventricle: Left ventricular ejection fraction, by estimation, is 20 to 25%. The left ventricle has severely decreased function. The left ventricle demonstrates global hypokinesis. Definity contrast agent was given IV to delineate the left ventricular endocardial borders. The left ventricular internal cavity size was normal in size. There is mild left ventricular hypertrophy.  Left ventricular diastolic parameters are indeterminate. Right Ventricle: Unable to assess RVSP. The right ventricular size is normal. No increase in right ventricular wall thickness. Right ventricular systolic function is reduced. Left Atrium: Left atrial size was normal in size. Right Atrium: Right atrial size was normal in size. Pericardium: There is no evidence of pericardial effusion. Mitral Valve: The mitral valve is grossly normal. Mild mitral valve regurgitation. Tricuspid Valve: The tricuspid valve is grossly normal. Tricuspid valve regurgitation is trivial. Aortic Valve: The aortic valve is tricuspid. Aortic valve regurgitation is not visualized. Pulmonic Valve: The pulmonic valve was grossly normal. Pulmonic valve regurgitation is trivial. Aorta: The aortic root is normal in size and structure. Venous: IVC not visualized. IAS/Shunts: No atrial level shunt detected by color flow Doppler.  TRICUSPID VALVE TR Peak grad:   16.0 mmHg TR Vmax:        200.00 cm/s Rozann Lesches MD Electronically signed by Rozann Lesches MD Signature Date/Time: 09/25/2019/11:44:07 AM    Final      Time coordinating discharge: Over 30 minutes  SIGNED:   Guilford Shi, MD  [facility] 09/29/2019, 2:42 PM

## 2019-09-29 NOTE — Progress Notes (Signed)
Progress Note  Patient Name: Vincent Peters Date of Encounter: 09/29/2019  Primary Cardiologist: Candee Furbish, MD   Subjective   Feeling well this morning.  Sitting up in chair.  No shortness of breath no chest pain.  No edema.  Inpatient Medications    Scheduled Meds: . apixaban  5 mg Oral BID  . chlordiazePOXIDE  25 mg Oral Daily  . chlorhexidine  15 mL Mouth Rinse BID  . Chlorhexidine Gluconate Cloth  6 each Topical Daily  . folic acid  1 mg Oral Daily  . lactulose  20 g Per Tube Daily  . mouth rinse  15 mL Mouth Rinse q12n4p  . metoprolol succinate  200 mg Oral Daily  . multivitamin with minerals  1 tablet Oral Daily  . pantoprazole  40 mg Oral Daily  . sacubitril-valsartan  1 tablet Oral BID  . thiamine  100 mg Oral Daily   Or  . thiamine  100 mg Intravenous Daily   Continuous Infusions:  PRN Meds: LORazepam **OR** [DISCONTINUED] LORazepam, metoprolol tartrate, ondansetron, sodium chloride flush   Vital Signs    Vitals:   09/28/19 1536 09/28/19 2102 09/29/19 0449 09/29/19 0815  BP: 102/71 106/73 112/70   Pulse: 80 89 81 94  Resp: 18 17 19    Temp: 98.3 F (36.8 C) 98.8 F (37.1 C) 98.3 F (36.8 C)   TempSrc: Oral Oral Oral   SpO2: 100% 98% 98%   Weight:        Intake/Output Summary (Last 24 hours) at 09/29/2019 0913 Last data filed at 09/29/2019 0451 Gross per 24 hour  Intake 1968 ml  Output 2025 ml  Net -57 ml   Last 3 Weights 09/27/2019 09/24/2019 07/16/2019  Weight (lbs) 143 lb 11.8 oz 130 lb 132 lb 4.4 oz  Weight (kg) 65.2 kg 58.968 kg 60 kg      Telemetry    Sinus rhythm no further A. fib- Personally Reviewed  ECG    No new- Personally Reviewed  Physical Exam   GEN: Well nourished, well developed, in no acute distress  HEENT: normal  Neck: no JVD, carotid bruits, or masses Cardiac: RRR; no murmurs, rubs, or gallops,no edema  Respiratory:  clear to auscultation bilaterally, normal work of breathing GI: soft, nontender,  nondistended, + BS MS: no deformity or atrophy  Skin: warm and dry, no rash Neuro:  Alert pleasant Psych: euthymic mood, full affect   Labs    High Sensitivity Troponin:   Recent Labs  Lab 09/23/19 1414 09/27/19 1755  TROPONINIHS 11 16      Chemistry Recent Labs  Lab 09/26/19 0123 09/27/19 0333 09/29/19 0750  NA 138 137 136  K 3.8 3.1* 3.7  CL 102 101 103  CO2 23 25 24   GLUCOSE 118* 102* 90  BUN <5* <5* 9  CREATININE 0.49* 0.56* 0.61  CALCIUM 8.9 9.0 8.7*  PROT 6.3* 7.1 6.3*  ALBUMIN 2.7* 3.0* 2.7*  AST 188* 111* 103*  ALT 129* 120* 130*  ALKPHOS 80 92 84  BILITOT 1.0 0.7 0.6  GFRNONAA >60 >60 >60  GFRAA >60 >60 >60  ANIONGAP 13 11 9      Hematology Recent Labs  Lab 09/26/19 0123 09/27/19 0333 09/29/19 0750  WBC 6.9 10.1 8.8  RBC 3.88* 4.34 4.95  HGB 12.8* 14.2 16.1  HCT 37.2* 41.1 48.3  MCV 95.9 94.7 97.6  MCH 33.0 32.7 32.5  MCHC 34.4 34.5 33.3  RDW 13.0 12.8 13.1  PLT 77* 112* 176  BNPNo results for input(s): BNP, PROBNP in the last 168 hours.   DDimer No results for input(s): DDIMER in the last 168 hours.   Radiology    No results found.  Cardiac Studies   EF 25%  Patient Profile     39 y.o. 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  Blood pressure under better control with Entresto.  Excellent urine output.  -Eliquis for atrial fibrillation, CHA2DS2-VASc 2. -CIWA protocol. -Needs case manager help for medications. Euvolemic  We will set up appointment for follow-up.  We will go ahead and sign off.  For questions or updates, please  contact [facility] Please consult [email or web address] for contact info under        Signed, Donato Schultz, MD  09/29/2019, 9:13 AM

## 2019-09-29 NOTE — Progress Notes (Signed)
Pt refused blood draw this morning, educated pt.

## 2019-10-03 ENCOUNTER — Ambulatory Visit: Payer: Self-pay | Attending: Family Medicine | Admitting: Family Medicine

## 2019-10-03 ENCOUNTER — Encounter: Payer: Self-pay | Admitting: Family Medicine

## 2019-10-03 ENCOUNTER — Other Ambulatory Visit: Payer: Self-pay

## 2019-10-03 VITALS — BP 111/84 | HR 63 | Wt 147.4 lb

## 2019-10-03 DIAGNOSIS — I10 Essential (primary) hypertension: Secondary | ICD-10-CM

## 2019-10-03 DIAGNOSIS — F1010 Alcohol abuse, uncomplicated: Secondary | ICD-10-CM

## 2019-10-03 DIAGNOSIS — I420 Dilated cardiomyopathy: Secondary | ICD-10-CM

## 2019-10-03 DIAGNOSIS — R7989 Other specified abnormal findings of blood chemistry: Secondary | ICD-10-CM

## 2019-10-03 DIAGNOSIS — Z720 Tobacco use: Secondary | ICD-10-CM

## 2019-10-03 NOTE — Patient Instructions (Signed)
Informacin sobre fumar tabaco para adultos Smoking Tobacco Information, Adult Fumar tabaco puede ser perjudicial para su salud. El tabaco contiene un qumico incoloro venenoso (txico) llamado nicotina. La nicotina es [place]. Produce cambios en el cerebro y puede hacer que sea difcil dejar de fumar. El tabaco tambin tiene otros qumicos txicos que pueden daar el cuerpo y aumentar el riesgo de contraer muchos tipos de cnceres. De qu forma me puede afectar fumar tabaco? Fumar tabaco lo pone en riesgo de tener lo siguiente:  Cncer. El fumar se asocia con mayor frecuencia al cncer de pulmn, pero tambin puede provocar cncer en otras partes del cuerpo.  Enfermedad pulmonar obstructiva crnica (EPOC). Esta es una afeccin pulmonar a largo plazo que dificulta la respiracin. Tambin empeora con [employer].  Presin arterial alta (hipertensin), enfermedad cardaca, accidente cerebrovascular o infarto de miocardio.  Infecciones pulmonares, como la neumona.  Cataratas. Esto es cuando el lente natural del ojo se [place].  Problemas digestivos. Esto puede incluir lceras ppticas, acidez estomacal y enfermedad de reflujo gastroesofgico (ERGE).  Problemas de salud bucal, como enfermedad de las encas y prdida de dientes.  Prdida del gusto y del olfato. Fumar puede afectar su aspecto al hacer que tenga:  [place].  Dientes, dedos y uas amarillos o [place]. Fumar tabaco tambin puede afectar su vida social, porque:  Puede ser difcil encontrar lugares donde poder fumar cuando no est en su casa. En muchos lugares de trabajo, restaurantes, hoteles y sitios pblicos est prohibido fumar.  Fumar es [employer]. Esto se debe al precio del tabaco y a los costos a largo plazo del tratamiento de los problemas de salud derivados de fumar.  El humo exhalado al fumar puede afectar a las personas que lo rodean. El humo exhalado al fumar puede causar cncer de pulmn, problemas  respiratorios y enfermedad cardaca. Los nios de personas fumadoras corren ms riesgo de tener: ? Sndrome de muerte sbita del lactante (SMSL). ? Infecciones en los odos. ? Infecciones pulmonares. Si usted actualmente fuma tabaco, el dejar de fumar ahora puede ayudarlo a:  Tener una vida ms larga y ms saludable.  Tener mejor aspecto y Restaurant manager, fast food, Ambulance person y sentirse mejor con Physiological scientist.  Ahorrar dinero.  Proteger a Building control surveyor los perjuicios del humo exhalado al fumar. Qu medidas puedo tomar para ayudar a prevenir problemas de salud? Deje de fumar   No comience a fumar. Deje de fumar si ya lo hace.  Disee un plan para dejar de fumar y compromtase a llevarlo a cabo. Busque programas que lo ayuden y Engineer, materials e ideas a su mdico.  Fije una fecha y anote todas las razones por las que quiere dejar de fumar.  Informe a sus familiares y amigos de que est dejando de fumar, de modo que puedan brindarle [place] y 83. Considere la posibilidad de Pension scheme manager amigos que tambin quieran dejar de fumar. Puede ser ms fcil dejar de fumar con [place] persona, de modo que puedan apoyarse el uno al [place].  Hable con su mdico sobre el uso de medicamentos de reemplazo de la nicotina para que lo ayuden a Personal assistant de fumar, como chicles, pastillas, parches, aerosoles o pldoras.  No sustituya los cigarrillos comunes por cigarrillos electrnicos. La seguridad de los cigarrillos electrnicos no se conoce, y podran tener qumicos txicos.  Si al intentar dejar de fumar vuelve a recaer, permanezca positivo. Es habitual que esto suceda al principio, de modo que avance paso a paso.  Est preparado para sentir deseos imperiosos  de fumar. Cuando sienta la necesidad de fumar, [place] chicle o chupe un caramelo duro. Estilo de vida  Mantngase [place] y cuide de su cuerpo.  Beba suficiente lquido como para Pharmacologist la orina de color amarillo plido.  Haga mucho ejercicio y siga una  dieta saludable. Esto puede ayudar a prevenir el aumento de peso despus de dejar de fumar.  Controle sus hbitos alimentarios. Abandonar el hbito de fumar puede hacer que tenga ms apetito que cuando fumaba.  Busque formas de relajarse. Salga con sus amigos o familiares al cine o a un restaurante donde la gente no fume.  Consulte a su mdico acerca de realizarse exmenes peridicos (pruebas de deteccin) para la deteccin del cncer. Estas pueden incluir anlisis de [place], estudios de diagnstico por imgenes y otros exmenes.  Encuentre la forma de controlar el estrs; por ejemplo, a travs de la meditacin, el yoga o el ejercicio. Dnde encontrar apoyo Para conseguir apoyo para dejar de fumar, considere:  Pedir al [place] brinde ms informacin y recursos.  Tomar clases para aprender ms sobre cmo dejar de fumar.  Buscar organizaciones locales que ofrezcan recursos para dejar de fumar.  Unirse a un grupo de apoyo para personas que quieren dejar de fumar en su comunidad local.  Llame al nmero de asistencia de ActualTaxes.ch: 1-800-Quit-Now 618 046 9759) Dnde buscar ms informacin Puede encontrar ms informacin sobre cmo dejar de fumar de las siguientes organizaciones:  HelpGuide.org: www.helpguide.org  BankRights.uy: smokefree.gov  American Lung Association (Asociacin Estadounidense del Pulmn): www.lung.org Comunquese con un mdico si:  Tiene dificultad para respirar.  Los labios, la nariz o los dedos se lo ponen [place].  Siente dolor en el pecho.  Escupe sangre al toser.  Se marea o pierde el conocimiento.  Tiene otros cambios de salud que lo preocupan. Resumen  Fumar tabaco puede afectar negativamente su salud, la salud de quienes lo rodean, sus finanzas y su vida social.  No comience a fumar. Deje de fumar si ya lo hace. Si necesita ayuda para dejar de fumar, consulte al [employer].  Piense en unirse a un grupo de apoyo para personas que quieren  dejar de fumar en su comunidad local. Existen muchos programas efectivos que lo ayudarn a Hotel manager hbito. Esta informacin no tiene Theme park manager el consejo del mdico. Asegrese de hacerle al mdico cualquier pregunta que tenga. Document Revised: 11/02/2017 Document Reviewed: 12/02/2016 Elsevier Patient Education  2020 [employer].

## 2019-10-04 LAB — MAGNESIUM: Magnesium: 1.6 mg/dL (ref 1.6–2.3)

## 2019-10-04 LAB — CMP14+EGFR
ALT: 87 IU/L — ABNORMAL HIGH (ref 0–44)
AST: 61 IU/L — ABNORMAL HIGH (ref 0–40)
Albumin/Globulin Ratio: 1.2 (ref 1.2–2.2)
Albumin: 3.7 g/dL — ABNORMAL LOW (ref 4.0–5.0)
Alkaline Phosphatase: 92 IU/L (ref 39–117)
BUN/Creatinine Ratio: 5 — ABNORMAL LOW (ref 9–20)
BUN: 3 mg/dL — ABNORMAL LOW (ref 6–20)
Bilirubin Total: 0.2 mg/dL (ref 0.0–1.2)
CO2: 22 mmol/L (ref 20–29)
Calcium: 9.6 mg/dL (ref 8.7–10.2)
Chloride: 102 mmol/L (ref 96–106)
Creatinine, Ser: 0.66 mg/dL — ABNORMAL LOW (ref 0.76–1.27)
GFR calc Af Amer: 143 mL/min/{1.73_m2} (ref >59)
GFR calc non Af Amer: 124 mL/min/{1.73_m2} (ref >59)
Globulin, Total: 3.1 g/dL (ref 1.5–4.5)
Glucose: 98 mg/dL (ref 65–99)
Potassium: 4.3 mmol/L (ref 3.5–5.2)
Sodium: 141 mmol/L (ref 134–144)
Total Protein: 6.8 g/dL (ref 6.0–8.5)

## 2019-10-04 NOTE — Progress Notes (Signed)
Subjective:  Patient ID: Vincent Peters, male    DOB: [birth year]  Age: 39 y.o. MRN: [id number]  CC: Hospitalization Follow-up   HPI Vincent Peters is a 39 year old male with a history of tobacco abuse who presents today to establish care after hospitalization at [facility] from 09/23/2019 through 09/29/2019 for alcohol withdrawal noted to be in narrow complex tachycardia. On presentation he had accelerated hypertension and transiently required Cardizem drip due to narrow complex tachycardia. Cardiac work-up by means of echocardiogram revealed:  IMPRESSIONS   1. Left ventricular ejection fraction, by estimation, is 20 to 25%. The  left ventricle has severely decreased function. The left ventricle  demonstrates global hypokinesis. There is mild left ventricular  hypertrophy. Left ventricular diastolic parameters  are indeterminate in the setting of atrial fibrillation/flutter.  2. Definity contrast utilized, no obvious LV mural thrombus.  3. Unable to assess RVSP. Right ventricular systolic function is reduced.  The right ventricular size is normal.  4. The mitral valve is grossly normal. Mild mitral valve regurgitation.  5. The aortic valve is tricuspid. Aortic valve regurgitation is not  visualized.  6. IVC not visualized.   Commenced on Eliquis for anticoagulation and rate control with metoprolol.  Entresto also commenced.  Alcohol withdrawal was treated with Precedex drip, Librium. Of note he did have elevated LFTs and hyperammonemia which was treated with lactulose.  Today he reports doing well he endorses compliance with his antihypertensives and denies dyspnea, palpitations, chest pain, pedal edema. Denies alcohol consumption but smokes 3-4 cig/day; would like to work on quitting on his own. He has no difficulty ambulating and has no additional concerns today and returns to work in 1 week. He works in Architect.  Past Medical  History:  Diagnosis Date  . Alcohol abuse   . Cocaine abuse ([place])   . Hypertension     Past Surgical History:  Procedure Laterality Date  . NO PAST SURGERIES      No family history on file.  No Known Allergies  Outpatient Medications Prior to Visit  Medication Sig Dispense Refill  . apixaban (ELIQUIS) 5 MG TABS tablet Take 1 tablet (5 mg total) by mouth 2 (two) times daily. 60 tablet 1  . folic acid (FOLVITE) 1 MG tablet Take 1 tablet (1 mg total) by mouth daily.    Marland Kitchen lactulose (CHRONULAC) 10 GM/15ML solution Take 30 mLs (20 g total) by mouth daily. 1892 mL 0  . metoprolol succinate (TOPROL-XL) 200 MG 24 hr tablet Take 1 tablet (200 mg total) by mouth daily. Take with or immediately following a meal. 30 tablet 2  . Multiple Vitamin (MULTIVITAMIN WITH MINERALS) TABS tablet Take 1 tablet by mouth daily.    . ondansetron (ZOFRAN) 4 MG tablet Take 1 tablet (4 mg total) by mouth every 8 (eight) hours as needed for nausea or vomiting. 20 tablet 0  . pantoprazole (PROTONIX) 40 MG tablet Take 1 tablet (40 mg total) by mouth daily. 30 tablet 1  . sacubitril-valsartan (ENTRESTO) 49-51 MG Take 1 tablet by mouth 2 (two) times daily. 60 tablet 1  . thiamine 100 MG tablet Take 1 tablet (100 mg total) by mouth daily. 30 tablet 0   No facility-administered medications prior to visit.     ROS Review of Systems  Constitutional: Negative for activity change and appetite change.  HENT: Negative for sinus pressure and sore throat.   Eyes: Negative for visual disturbance.  Respiratory: Negative for cough, chest tightness and shortness of breath.  Cardiovascular: Negative for chest pain and leg swelling.  Gastrointestinal: Negative for abdominal distention, abdominal pain, constipation and diarrhea.  Endocrine: Negative.   Genitourinary: Negative for dysuria.  Musculoskeletal: Negative for joint swelling and myalgias.  Skin: Negative for rash.  Allergic/Immunologic: Negative.   Neurological:  Negative for weakness, light-headedness and numbness.  Psychiatric/Behavioral: Negative for dysphoric mood and suicidal ideas.    Objective:  BP 111/84   Pulse 63   Wt 147 lb 6.4 oz (66.9 kg)   SpO2 100%   BMI 21.77 kg/m   BP/Weight 10/03/2019 09/29/2019 11/20/3788  Systolic BP 240 973 -  Diastolic BP 84 69 -  Wt. (Lbs) 147.4 - 143.74  BMI 21.77 - 21.23  Some encounter information is confidential and restricted. Go to Review Flowsheets activity to see all data.      Physical Exam Constitutional:      Appearance: He is well-developed.  Neck:     Vascular: No JVD.  Cardiovascular:     Rate and Rhythm: Normal rate.     Heart sounds: Normal heart sounds. No murmur.  Pulmonary:     Effort: Pulmonary effort is normal.     Breath sounds: Normal breath sounds. No wheezing or rales.  Chest:     Chest wall: No tenderness.  Abdominal:     General: Bowel sounds are normal. There is no distension.     Palpations: Abdomen is soft. There is no mass.     Tenderness: There is no abdominal tenderness.  Musculoskeletal:        General: Normal range of motion.     Right lower leg: No edema.     Left lower leg: No edema.  Neurological:     Mental Status: He is alert and oriented to person, place, and time.  Psychiatric:        Mood and Affect: Mood normal.     CMP Latest Ref Rng & Units 10/03/2019 09/29/2019 09/27/2019  Glucose 65 - 99 mg/dL 98 90 102(H)  BUN 6 - 20 mg/dL 3(L) 9 <5(L)  Creatinine 0.76 - 1.27 mg/dL 0.66(L) 0.61 0.56(L)  Sodium 134 - 144 mmol/L 141 136 137  Potassium 3.5 - 5.2 mmol/L 4.3 3.7 3.1(L)  Chloride 96 - 106 mmol/L 102 103 101  CO2 20 - 29 mmol/L _0 Calcium 8.7 - 10.2 mg/dL 9.6 8.7(L) 9.0  Total Protein 6.0 - 8.5 g/dL 6.8 6.3(L) 7.1  Total Bilirubin 0.0 - 1.2 mg/dL 0.2 0.6 0.7  Alkaline Phos 39 - 117 IU/L 92 84 92  AST 0 - 40 IU/L 61(H) 103(H) 111(H)  ALT 0 - 44 IU/L 87(H) 130(H) 120(H)    Lipid Panel  No results found for: CHOL, TRIG, HDL,  CHOLHDL, VLDL, LDLCALC, LDLDIRECT  CBC    Component Value Date/Time   WBC 8.8 09/29/2019 0750   RBC 4.95 09/29/2019 0750   HGB 16.1 09/29/2019 0750   HCT 48.3 09/29/2019 0750   PLT 176 09/29/2019 0750   MCV 97.6 09/29/2019 0750   MCH 32.5 09/29/2019 0750   MCHC 33.3 09/29/2019 0750   RDW 13.1 09/29/2019 0750   LYMPHSABS 0.8 09/23/2019 1414   MONOABS 0.8 09/23/2019 1414   EOSABS 0.1 09/23/2019 1414   BASOSABS 0.1 09/23/2019 1414    Lab Results  Component Value Date   HGBA1C 5.4 04/28/2019    Assessment & Plan:  1. 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 Elevated LFTs Likely secondary to alcohol abuse We will repeat LFTs - CMP14+EGFR  3. Alcohol abuse He has quit alcohol and has been commended  4. Hypomagnesemia Magnesium level at discharge was 1.6 We will check levels again - Magnesium  5. Tobacco abuse Spent 3 minutes counseling on smoking cessation and hazardous effect of ongoing tobacco use and he would like to work on quitting on his own  6. Essential hypertension Controlled Continue metoprolol Counseled on blood pressure goal of less than 130/80, low-sodium, DASH diet, medication compliance, 150 minutes of moderate intensity exercise per week. Discussed medication compliance, adverse effects.      Follow-up: Return in about 3 months (around 01/03/2020) for chronic disease management.       Charlott Rakes, MD, FAAFP. [facility], [place]   10/04/2019, 8:17 AM

## 2019-10-07 ENCOUNTER — Telehealth: Payer: Self-pay

## 2019-10-07 NOTE — Telephone Encounter (Signed)
No phone number on file

## 2019-10-07 NOTE — Telephone Encounter (Signed)
-----   Message from Enobong Newlin, MD sent at 10/04/2019  8:26 AM EST ----- Please inform him magnesium level is normal, LFTs are slightly elevated but have improved compared to previous labs.  This is of the result of his quitting alcohol and he needs to be commended on doing so.  Thanks 

## 2019-10-11 NOTE — Progress Notes (Deleted)
CARDIOLOGY OFFICE NOTE  Date:  10/11/2019    Vincent Peters Date of Birth: 09-Sep-1980 Medical Record #[id number]  PCP:  Patient, No Pcp Per  Cardiologist:  Anne Fu  No chief complaint on file.   History of Present Illness: Vincent Peters is a 39 y.o. male who presents today for a post hospital visit. Seen for Dr. Anne Fu.   Has had several admissions over the past 6 months with alcohol withdrawal. Last admission last month - again for alcohol withdrawal - had PAF noted - echo with newly found cardiomyopathy - either alcohol related/tachycardia mediated. Was placed on Entresto/beta blocker along with Eliquis.   The patient {does/does not:200015} have symptoms concerning for COVID-19 infection (fever, chills, cough, or new shortness of breath).   Comes in today. Here with   Past Medical History:  Diagnosis Date  . Alcohol abuse   . Cocaine abuse ([facility])   . Hypertension     Past Surgical History:  Procedure Laterality Date  . NO PAST SURGERIES       Medications: No outpatient medications have been marked as taking for the 10/17/19 encounter (Appointment) with Rosalio Macadamia, NP.     Allergies: No Known Allergies  Social History: The patient  reports that he has been smoking cigarettes. He has never used smokeless tobacco. He reports current alcohol use of about 4.0 standard drinks of alcohol per week. He reports current drug use. Drug: Cocaine.   Family History: The patient's ***family history is not on file.   Review of Systems: Please see the history of present illness.   All other systems are reviewed and negative.   Physical Exam: VS:  There were no vitals taken for this visit. Marland Kitchen  BMI There is no height or weight on file to calculate BMI.  Wt Readings from Last 3 Encounters:  10/03/19 147 lb 6.4 oz (66.9 kg)  09/27/19 143 lb 11.8 oz (65.2 kg)  07/16/19 132 lb 4.4 oz (60 kg)    General: Pleasant. Well developed, well  nourished and in no acute distress.   HEENT: Normal.  Neck: Supple, no JVD, carotid bruits, or masses noted.  Cardiac: ***Regular rate and rhythm. No murmurs, rubs, or gallops. No edema.  Respiratory:  Lungs are clear to auscultation bilaterally with normal work of breathing.  GI: Soft and nontender.  MS: No deformity or atrophy. Gait and ROM intact.  Skin: Warm and dry. Color is normal.  Neuro:  Strength and sensation are intact and no gross focal deficits noted.  Psych: Alert, appropriate and with normal affect.   LABORATORY DATA:  EKG:  EKG {ACTION; IS/IS DTO:67124580} ordered today. This demonstrates ***.  Lab Results  Component Value Date   WBC 8.8 09/29/2019   HGB 16.1 09/29/2019   HCT 48.3 09/29/2019   PLT 176 09/29/2019   GLUCOSE 98 10/03/2019   ALT 87 (H) 10/03/2019   AST 61 (H) 10/03/2019   NA 141 10/03/2019   K 4.3 10/03/2019   CL 102 10/03/2019   CREATININE 0.66 (L) 10/03/2019   BUN 3 (L) 10/03/2019   CO2 22 10/03/2019   TSH 1.778 09/23/2019   INR 1.0 09/23/2019   HGBA1C 5.4 04/28/2019     BNP (last 3 results) No results for input(s): BNP in the last 8760 hours.  ProBNP (last 3 results) No results for input(s): PROBNP in the last 8760 hours.   Other Studies Reviewed Today:  ECHO IMPRESSIONS 09/2019  1. Left ventricular ejection fraction, by estimation, is  20 to 25%. The  left ventricle has severely decreased function. The left ventricle  demonstrates global hypokinesis. There is mild left ventricular  hypertrophy. Left ventricular diastolic parameters  are indeterminate in the setting of atrial fibrillation/flutter.  2. Definity contrast utilized, no obvious LV mural thrombus.  3. Unable to assess RVSP. Right ventricular systolic function is reduced.  The right ventricular size is normal.  4. The mitral valve is grossly normal. Mild mitral valve regurgitation.  5. The aortic valve is tricuspid. Aortic valve regurgitation is not  visualized.    6. IVC not visualized.    Assessment/Plan:  1. Newly found cardiomyopathy - alcohol/tachy mediated -   2. Acute systolic HF  3. PAF -   4. Alcohol abuse -   5. COVID-19 Education: The signs and symptoms of COVID-19 were discussed with the patient and how to seek care for testing (follow up with PCP or arrange E-visit).  The importance of social distancing, staying at home, hand hygiene and wearing a mask when out in public were discussed today.  Current medicines are reviewed with the patient today.  The patient does not have concerns regarding medicines other than what has been noted above.  The following changes have been made:  See above.  Labs/ tests ordered today include:   No orders of the defined types were placed in this encounter.    Disposition:   FU with *** in {gen number 5-28:413244} {Days to years:10300}.   Patient is agreeable to this plan and will call if any problems develop in the interim.   SignedTruitt Merle, NP  10/11/2019 1:37 PM  [facility] [address] [place], [place]  [address] Phone: [phone] Fax: [phone]

## 2019-10-14 ENCOUNTER — Telehealth: Payer: Self-pay

## 2019-10-14 NOTE — Telephone Encounter (Signed)
no number on file to call with results.

## 2019-10-14 NOTE — Telephone Encounter (Signed)
-----   Message from Hoy Register, MD sent at 10/04/2019  8:26 AM EST ----- Please inform him magnesium level is normal, LFTs are slightly elevated but have improved compared to previous labs.  This is of the result of his quitting alcohol and he needs to be commended on doing so.  Thanks

## 2019-10-17 ENCOUNTER — Ambulatory Visit: Payer: Self-pay | Admitting: Nurse Practitioner

## 2019-11-07 ENCOUNTER — Encounter: Payer: Self-pay | Admitting: General Practice

## 2020-01-04 ENCOUNTER — Ambulatory Visit: Payer: Self-pay | Admitting: Family Medicine

## 2020-06-03 ENCOUNTER — Other Ambulatory Visit: Payer: Self-pay

## 2020-06-03 ENCOUNTER — Encounter (HOSPITAL_COMMUNITY): Payer: Self-pay | Admitting: Emergency Medicine

## 2020-06-03 ENCOUNTER — Inpatient Hospital Stay (HOSPITAL_COMMUNITY)
Admission: EM | Admit: 2020-06-03 | Discharge: 2020-06-12 | DRG: 896 | Disposition: A | Discharge status: Home / Self Care | Payer: Self-pay | Attending: Internal Medicine | Admitting: Internal Medicine

## 2020-06-03 DIAGNOSIS — D72829 Elevated white blood cell count, unspecified: Secondary | ICD-10-CM | POA: Diagnosis present

## 2020-06-03 DIAGNOSIS — G9341 Metabolic encephalopathy: Secondary | ICD-10-CM | POA: Diagnosis present

## 2020-06-03 DIAGNOSIS — D509 Iron deficiency anemia, unspecified: Secondary | ICD-10-CM | POA: Diagnosis present

## 2020-06-03 DIAGNOSIS — D638 Anemia in other chronic diseases classified elsewhere: Secondary | ICD-10-CM | POA: Diagnosis present

## 2020-06-03 DIAGNOSIS — R7401 Elevation of levels of liver transaminase levels: Secondary | ICD-10-CM

## 2020-06-03 DIAGNOSIS — F10939 Alcohol use, unspecified with withdrawal, unspecified: Secondary | ICD-10-CM

## 2020-06-03 DIAGNOSIS — R339 Retention of urine, unspecified: Secondary | ICD-10-CM | POA: Diagnosis not present

## 2020-06-03 DIAGNOSIS — L218 Other seborrheic dermatitis: Secondary | ICD-10-CM | POA: Diagnosis present

## 2020-06-03 DIAGNOSIS — M6282 Rhabdomyolysis: Secondary | ICD-10-CM

## 2020-06-03 DIAGNOSIS — D6959 Other secondary thrombocytopenia: Secondary | ICD-10-CM | POA: Diagnosis present

## 2020-06-03 DIAGNOSIS — E876 Hypokalemia: Secondary | ICD-10-CM | POA: Diagnosis present

## 2020-06-03 DIAGNOSIS — E871 Hypo-osmolality and hyponatremia: Secondary | ICD-10-CM

## 2020-06-03 DIAGNOSIS — R509 Fever, unspecified: Secondary | ICD-10-CM

## 2020-06-03 DIAGNOSIS — F10239 Alcohol dependence with withdrawal, unspecified: Principal | ICD-10-CM

## 2020-06-03 DIAGNOSIS — F10231 Alcohol dependence with withdrawal delirium: Principal | ICD-10-CM | POA: Diagnosis not present

## 2020-06-03 DIAGNOSIS — K7010 Alcoholic hepatitis without ascites: Secondary | ICD-10-CM | POA: Diagnosis present

## 2020-06-03 DIAGNOSIS — I952 Hypotension due to drugs: Secondary | ICD-10-CM | POA: Diagnosis present

## 2020-06-03 DIAGNOSIS — I480 Paroxysmal atrial fibrillation: Secondary | ICD-10-CM

## 2020-06-03 DIAGNOSIS — F10931 Alcohol use, unspecified with withdrawal delirium: Secondary | ICD-10-CM | POA: Diagnosis present

## 2020-06-03 DIAGNOSIS — I5022 Chronic systolic (congestive) heart failure: Secondary | ICD-10-CM

## 2020-06-03 DIAGNOSIS — R6510 Systemic inflammatory response syndrome (SIRS) of non-infectious origin without acute organ dysfunction: Secondary | ICD-10-CM

## 2020-06-03 DIAGNOSIS — I110 Hypertensive heart disease with heart failure: Secondary | ICD-10-CM | POA: Diagnosis present

## 2020-06-03 DIAGNOSIS — Z20822 Contact with and (suspected) exposure to covid-19: Secondary | ICD-10-CM | POA: Diagnosis present

## 2020-06-03 DIAGNOSIS — K219 Gastro-esophageal reflux disease without esophagitis: Secondary | ICD-10-CM

## 2020-06-03 DIAGNOSIS — Z781 Physical restraint status: Secondary | ICD-10-CM

## 2020-06-03 DIAGNOSIS — E860 Dehydration: Secondary | ICD-10-CM | POA: Diagnosis present

## 2020-06-03 DIAGNOSIS — Z9114 Patient's other noncompliance with medication regimen: Secondary | ICD-10-CM

## 2020-06-03 DIAGNOSIS — I420 Dilated cardiomyopathy: Secondary | ICD-10-CM | POA: Diagnosis present

## 2020-06-03 DIAGNOSIS — E872 Acidosis: Secondary | ICD-10-CM | POA: Diagnosis present

## 2020-06-03 DIAGNOSIS — I160 Hypertensive urgency: Secondary | ICD-10-CM

## 2020-06-03 DIAGNOSIS — E46 Unspecified protein-calorie malnutrition: Secondary | ICD-10-CM | POA: Diagnosis present

## 2020-06-03 LAB — RESPIRATORY PANEL BY RT PCR (FLU A&B, COVID)
Influenza A by PCR: NEGATIVE
Influenza B by PCR: NEGATIVE
SARS Coronavirus 2 by RT PCR: NEGATIVE

## 2020-06-03 LAB — CBC
HCT: 39.8 % (ref 39.0–52.0)
Hemoglobin: 13.1 g/dL (ref 13.0–17.0)
MCH: 33.2 pg (ref 26.0–34.0)
MCHC: 32.9 g/dL (ref 30.0–36.0)
MCV: 100.8 fL — ABNORMAL HIGH (ref 80.0–100.0)
Platelets: 82 10*3/uL — ABNORMAL LOW (ref 150–400)
RBC: 3.95 MIL/uL — ABNORMAL LOW (ref 4.22–5.81)
RDW: 12.5 % (ref 11.5–15.5)
WBC: 13.5 10*3/uL — ABNORMAL HIGH (ref 4.0–10.5)
nRBC: 0 % (ref 0.0–0.2)

## 2020-06-03 LAB — COMPREHENSIVE METABOLIC PANEL
ALT: 64 U/L — ABNORMAL HIGH (ref 0–44)
AST: 163 U/L — ABNORMAL HIGH (ref 15–41)
Albumin: 3.3 g/dL — ABNORMAL LOW (ref 3.5–5.0)
Alkaline Phosphatase: 106 U/L (ref 38–126)
Anion gap: 25 — ABNORMAL HIGH (ref 5–15)
BUN: 10 mg/dL (ref 6–20)
CO2: 17 mmol/L — ABNORMAL LOW (ref 22–32)
Calcium: 9 mg/dL (ref 8.9–10.3)
Chloride: 97 mmol/L — ABNORMAL LOW (ref 98–111)
Creatinine, Ser: 0.56 mg/dL — ABNORMAL LOW (ref 0.61–1.24)
GFR, Estimated: >60 mL/min (ref >60)
Glucose, Bld: 119 mg/dL — ABNORMAL HIGH (ref 70–99)
Potassium: 4 mmol/L (ref 3.5–5.1)
Sodium: 139 mmol/L (ref 135–145)
Total Bilirubin: 2.2 mg/dL — ABNORMAL HIGH (ref 0.3–1.2)
Total Protein: 7.8 g/dL (ref 6.5–8.1)

## 2020-06-03 LAB — BLOOD GAS, VENOUS
Acid-Base Excess: 2 mmol/L (ref 0.0–2.0)
Bicarbonate: 26.2 mmol/L (ref 20.0–28.0)
O2 Saturation: 81.3 %
Patient temperature: 98.6
pCO2, Ven: 41.6 mmHg — ABNORMAL LOW (ref 44.0–60.0)
pH, Ven: 7.415 (ref 7.250–7.430)
pO2, Ven: 50 mmHg — ABNORMAL HIGH (ref 32.0–45.0)

## 2020-06-03 LAB — TROPONIN I (HIGH SENSITIVITY): Troponin I (High Sensitivity): 26 ng/L — ABNORMAL HIGH (ref <18)

## 2020-06-03 LAB — RAPID URINE DRUG SCREEN, HOSP PERFORMED
Amphetamines: NOT DETECTED
Barbiturates: NOT DETECTED
Benzodiazepines: NOT DETECTED
Cocaine: NOT DETECTED
Opiates: NOT DETECTED
Tetrahydrocannabinol: NOT DETECTED

## 2020-06-03 LAB — CK: Total CK: 1885 U/L — ABNORMAL HIGH (ref 49–397)

## 2020-06-03 LAB — ETHANOL: Alcohol, Ethyl (B): <10 mg/dL (ref <10)

## 2020-06-03 LAB — AMMONIA: Ammonia: 50 umol/L — ABNORMAL HIGH (ref 9–35)

## 2020-06-03 LAB — LACTIC ACID, PLASMA: Lactic Acid, Venous: 1.1 mmol/L (ref 0.5–1.9)

## 2020-06-03 MED ORDER — TRAMADOL HCL 50 MG PO TABS
50.0000 mg | ORAL_TABLET | Freq: Four times a day (QID) | ORAL | Status: DC | PRN
  Administered 2020-06-04 (×2): 50 mg via ORAL
  Filled 2020-06-03 (×2): qty 1

## 2020-06-03 MED ORDER — LACTATED RINGERS IV BOLUS
1000.0000 mL | Freq: Once | INTRAVENOUS | Status: AC
  Administered 2020-06-03: 1000 mL via INTRAVENOUS

## 2020-06-03 MED ORDER — ADULT MULTIVITAMIN W/MINERALS CH
1.0000 | ORAL_TABLET | Freq: Every day | ORAL | Status: DC
  Administered 2020-06-04 – 2020-06-12 (×7): 1 via ORAL
  Filled 2020-06-03 (×9): qty 1

## 2020-06-03 MED ORDER — APIXABAN 5 MG PO TABS
5.0000 mg | ORAL_TABLET | Freq: Two times a day (BID) | ORAL | Status: DC
  Administered 2020-06-03 – 2020-06-12 (×15): 5 mg via ORAL
  Filled 2020-06-03 (×4): qty 1
  Filled 2020-06-03: qty 2
  Filled 2020-06-03 (×6): qty 1
  Filled 2020-06-03: qty 2
  Filled 2020-06-03 (×8): qty 1

## 2020-06-03 MED ORDER — SODIUM CHLORIDE 0.9 % IV SOLN: INTRAVENOUS | Status: AC

## 2020-06-03 MED ORDER — FOLIC ACID 1 MG PO TABS
1.0000 mg | ORAL_TABLET | Freq: Every day | ORAL | Status: DC
  Administered 2020-06-04 – 2020-06-12 (×8): 1 mg via ORAL
  Filled 2020-06-03 (×9): qty 1

## 2020-06-03 MED ORDER — LORAZEPAM 1 MG PO TABS: 0.0000 mg | ORAL_TABLET | Freq: Four times a day (QID) | ORAL | Status: AC

## 2020-06-03 MED ORDER — THIAMINE HCL 100 MG/ML IJ SOLN
Freq: Once | INTRAVENOUS | Status: AC
  Filled 2020-06-03: qty 1000

## 2020-06-03 MED ORDER — THIAMINE HCL 100 MG PO TABS
100.0000 mg | ORAL_TABLET | Freq: Every day | ORAL | Status: DC
  Administered 2020-06-06: 100 mg via ORAL
  Filled 2020-06-03 (×3): qty 1

## 2020-06-03 MED ORDER — ONDANSETRON HCL 4 MG/2ML IJ SOLN
INTRAMUSCULAR | Status: AC
  Administered 2020-06-03: 4 mg via INTRAVENOUS
  Filled 2020-06-03: qty 2

## 2020-06-03 MED ORDER — ONDANSETRON HCL 4 MG/2ML IJ SOLN
4.0000 mg | Freq: Four times a day (QID) | INTRAMUSCULAR | Status: DC | PRN
  Administered 2020-06-04: 4 mg via INTRAVENOUS
  Filled 2020-06-03: qty 2

## 2020-06-03 MED ORDER — IBUPROFEN 200 MG PO TABS
400.0000 mg | ORAL_TABLET | Freq: Four times a day (QID) | ORAL | Status: DC | PRN
  Administered 2020-06-06: 400 mg via ORAL
  Filled 2020-06-03: qty 2

## 2020-06-03 MED ORDER — PANTOPRAZOLE SODIUM 40 MG PO TBEC
40.0000 mg | DELAYED_RELEASE_TABLET | Freq: Every day | ORAL | Status: DC
  Administered 2020-06-04 – 2020-06-12 (×8): 40 mg via ORAL
  Filled 2020-06-03 (×9): qty 1

## 2020-06-03 MED ORDER — LORAZEPAM 2 MG/ML IJ SOLN
0.0000 mg | Freq: Two times a day (BID) | INTRAMUSCULAR | Status: AC
  Filled 2020-06-03: qty 2

## 2020-06-03 MED ORDER — ONDANSETRON HCL 4 MG PO TABS: 4.0000 mg | ORAL_TABLET | Freq: Four times a day (QID) | ORAL | Status: DC | PRN

## 2020-06-03 MED ORDER — LORAZEPAM 2 MG/ML IJ SOLN
0.0000 mg | Freq: Four times a day (QID) | INTRAMUSCULAR | Status: AC
  Administered 2020-06-04 (×4): 2 mg via INTRAVENOUS
  Filled 2020-06-03 (×4): qty 1

## 2020-06-03 MED ORDER — THIAMINE HCL 100 MG PO TABS
100.0000 mg | ORAL_TABLET | Freq: Every day | ORAL | Status: DC
  Administered 2020-06-04 – 2020-06-12 (×8): 100 mg via ORAL
  Filled 2020-06-03 (×7): qty 1

## 2020-06-03 MED ORDER — LACTULOSE 10 GM/15ML PO SOLN
20.0000 g | Freq: Every day | ORAL | Status: DC
  Administered 2020-06-04 – 2020-06-12 (×7): 20 g via ORAL
  Filled 2020-06-03 (×9): qty 30

## 2020-06-03 MED ORDER — THIAMINE HCL 100 MG/ML IJ SOLN
100.0000 mg | Freq: Every day | INTRAMUSCULAR | Status: DC
  Administered 2020-06-03 – 2020-06-04 (×2): 100 mg via INTRAVENOUS
  Filled 2020-06-03 (×2): qty 2

## 2020-06-03 MED ORDER — METOPROLOL SUCCINATE ER 50 MG PO TB24: 200.0000 mg | ORAL_TABLET | Freq: Every day | ORAL | Status: DC

## 2020-06-03 MED ORDER — LORAZEPAM 2 MG/ML IJ SOLN
2.0000 mg | Freq: Once | INTRAMUSCULAR | Status: AC
  Administered 2020-06-03: 2 mg via INTRAVENOUS
  Filled 2020-06-03: qty 1

## 2020-06-03 MED ORDER — LORAZEPAM 1 MG PO TABS
0.0000 mg | ORAL_TABLET | Freq: Two times a day (BID) | ORAL | Status: AC
  Administered 2020-06-06 (×2): 2 mg via ORAL
  Administered 2020-06-07: 1 mg via ORAL
  Filled 2020-06-03: qty 1
  Filled 2020-06-03 (×2): qty 2

## 2020-06-03 MED ORDER — ONDANSETRON HCL 4 MG/2ML IJ SOLN: 4.0000 mg | Freq: Once | INTRAMUSCULAR | Status: AC

## 2020-06-03 MED ORDER — METOPROLOL TARTRATE 5 MG/5ML IV SOLN
5.0000 mg | Freq: Once | INTRAVENOUS | Status: AC
  Administered 2020-06-03: 5 mg via INTRAVENOUS
  Filled 2020-06-03: qty 5

## 2020-06-03 MED ORDER — SACUBITRIL-VALSARTAN 49-51 MG PO TABS
1.0000 | ORAL_TABLET | Freq: Two times a day (BID) | ORAL | Status: DC
  Administered 2020-06-03 – 2020-06-12 (×15): 1 via ORAL
  Filled 2020-06-03 (×20): qty 1

## 2020-06-03 NOTE — ED Provider Notes (Signed)
[facility]-EMERGENCY DEPT Provider Note   CSN: [id number] Arrival date & time: 06/03/20  1932     History Chief Complaint  Patient presents with  . Alcohol Problem    Vincent Peters is a 39 y.o. male.  HPI Patient reports he typically drinks about 10 beers per day.  His last drink was early this morning before he was picked up for work.  He reports that he is gotten extremely shaky and developed nausea and vomited.  He denies abdominal pain.  He denies he is having visual hallucinations but feels extremely shaky.  Mild headache, no visual change.  No fever.  He does feel he is getting cramps through all of his body.  EMS brought the patient.  Unclear what transpired between the time patient left work and was transported.  EMS call was for an unconscious person in the street.  Patient has known history of significant alcohol abuse.    Past Medical History:  Diagnosis Date  . Alcohol abuse   . Cocaine abuse (HCC)   . Hypertension     Patient Active Problem List   Diagnosis Date Noted  . Alcohol abuse   . Hypertensive crisis 09/28/2019  . Dilated cardiomyopathy (HCC) 09/28/2019  . Hypomagnesemia 09/28/2019  . Alcohol withdrawal delirium, acute, hyperactive (HCC) 09/23/2019  . Elevated LFTs   . Withdrawal symptoms, alcohol (HCC) 07/06/2019  . Alcohol abuse with alcohol-induced mood disorder (HCC) 02/26/2018  . Alcohol withdrawal (HCC) 02/10/2014  . Hypokalemia 02/10/2014  . Thrombocytopenia (HCC) 02/10/2014  . Hypercalcemia 02/10/2014  . Delirium tremens (HCC) 02/10/2014    Past Surgical History:  Procedure Laterality Date  . NO PAST SURGERIES         No family history on file.  Social History   Tobacco Use  . Smoking status: Light Tobacco Smoker    Types: Cigarettes  . Smokeless tobacco: Never Used  Vaping Use  . Vaping Use: Never used  Substance Use Topics  . Alcohol use: Yes    Alcohol/week: 4.0 standard drinks     Types: 4 Cans of beer per week  . Drug use: Yes    Types: Cocaine    Home Medications Prior to Admission medications   Medication Sig Start Date End Date Taking? Authorizing Provider  apixaban (ELIQUIS) 5 MG TABS tablet Take 1 tablet (5 mg total) by mouth 2 (two) times daily. 09/29/19   Alessandra Bevels, MD  lactulose (CHRONULAC) 10 GM/15ML solution Take 30 mLs (20 g total) by mouth daily. 09/29/19   Alessandra Bevels, MD  metoprolol succinate (TOPROL-XL) 200 MG 24 hr tablet Take 1 tablet (200 mg total) by mouth daily. Take with or immediately following a meal. 09/30/19   Alessandra Bevels, MD  Multiple Vitamin (MULTIVITAMIN WITH MINERALS) TABS tablet Take 1 tablet by mouth daily. 09/29/19   Alessandra Bevels, MD  ondansetron (ZOFRAN) 4 MG tablet Take 1 tablet (4 mg total) by mouth every 8 (eight) hours as needed for nausea or vomiting. 09/29/19   Alessandra Bevels, MD  pantoprazole (PROTONIX) 40 MG tablet Take 1 tablet (40 mg total) by mouth daily. 09/29/19   Alessandra Bevels, MD  sacubitril-valsartan (ENTRESTO) 49-51 MG Take 1 tablet by mouth 2 (two) times daily. 09/29/19   Alessandra Bevels, MD    Allergies    Patient has no known allergies.  Review of Systems   Review of Systems 10 systems reviewed and negative except as per HPI Physical Exam Updated Vital Signs BP (!) 173/97  Pulse (!) 121   Temp 99.5 F (37.5 C) (Oral)   Resp 16   SpO2 98%   Physical Exam Constitutional:      Comments: Patient is extremely shaky.  He appears discheveled and mildly confused.  HENT:     Head: Normocephalic and atraumatic.     Mouth/Throat:     Pharynx: Oropharynx is clear.  Eyes:     Comments: Bilateral sclera injected.  Extraocular motion intact  Cardiovascular:     Comments: Tachycardia. Pulmonary:     Effort: Pulmonary effort is normal.     Breath sounds: Normal breath sounds.  Abdominal:     General: There is no distension.     Palpations: Abdomen is soft.     Tenderness: There is  no abdominal tenderness. There is no guarding.  Musculoskeletal:        General: Normal range of motion.     Cervical back: Neck supple.     Comments: No peripheral edema.  Patient generally has tenderness to palpation of his lower legs.  Skin:    General: Skin is warm and dry.  Neurological:     Comments: Patient is very tremulous throughout his extremities.  Movements are purposeful.  Cognitively he is oriented x3.  No focal motor deficits     ED Results / Procedures / Treatments   Labs (all labs ordered are listed, but only abnormal results are displayed) Labs Reviewed  COMPREHENSIVE METABOLIC PANEL - Abnormal; Notable for the following components:      Result Value   Chloride 97 (*)    CO2 17 (*)    Glucose, Bld 119 (*)    Creatinine, Ser 0.56 (*)    Albumin 3.3 (*)    AST 163 (*)    ALT 64 (*)    Total Bilirubin 2.2 (*)    Anion gap 25 (*)    All other components within normal limits  CBC - Abnormal; Notable for the following components:   WBC 13.5 (*)    RBC 3.95 (*)    MCV 100.8 (*)    Platelets 82 (*)    All other components within normal limits  AMMONIA - Abnormal; Notable for the following components:   Ammonia 50 (*)    All other components within normal limits  CK - Abnormal; Notable for the following components:   Total CK 1,885 (*)    All other components within normal limits  RESPIRATORY PANEL BY RT PCR (FLU A&B, COVID)  ETHANOL  RAPID URINE DRUG SCREEN, HOSP PERFORMED  LACTIC ACID, PLASMA  LACTIC ACID, PLASMA  BLOOD GAS, VENOUS  TROPONIN I (HIGH SENSITIVITY)    EKG EKG Interpretation  Date/Time:  Sunday June 03 2020 21:19:22 EDT Ventricular Rate:  115 PR Interval:    QRS Duration: 80 QT Interval:  332 QTC Calculation: 460 R Axis:   4 Text Interpretation: Sinus tachycardia Repol abnrm suggests ischemia, lateral leads agree, no sig change from previois Confirmed by Arby Barrette [phone]) on 06/03/2020 9:50:07 PM   Radiology No results  found.  Procedures Procedures (including critical care time) CRITICAL CARE Performed by: Arby Barrette   Total critical care time: 30 minutes  Critical care time was exclusive of separately billable procedures and treating other patients.  Critical care was necessary to treat or prevent imminent or life-threatening deterioration.  Critical care was time spent personally by me on the following activities: development of treatment plan with patient and/or surrogate as well as nursing, discussions with consultants,  evaluation of patient's response to treatment, examination of patient, obtaining history from patient or surrogate, ordering and performing treatments and interventions, ordering and review of laboratory studies, ordering and review of radiographic studies, pulse oximetry and re-evaluation of patient's condition. Medications Ordered in ED Medications  LORazepam (ATIVAN) injection 0-4 mg (has no administration in time range)    Or  LORazepam (ATIVAN) tablet 0-4 mg (has no administration in time range)  LORazepam (ATIVAN) injection 0-4 mg (has no administration in time range)    Or  LORazepam (ATIVAN) tablet 0-4 mg (has no administration in time range)  thiamine tablet 100 mg ( Oral See Alternative 06/03/20 2217)    Or  thiamine (B-1) injection 100 mg (100 mg Intravenous Given 06/03/20 2217)  ondansetron (ZOFRAN) injection 4 mg (4 mg Intravenous Given 06/03/20 2054)  lactated ringers bolus 1,000 mL (0 mLs Intravenous Stopped 06/03/20 2219)  LORazepam (ATIVAN) injection 2 mg (2 mg Intravenous Given 06/03/20 2118)  sodium chloride 0.9 % 1,000 mL with thiamine 100 mg, multivitamins adult 10 mL infusion ( Intravenous New Bag/Given 06/03/20 2220)    ED Course  I have reviewed the triage vital signs and the nursing notes.  Pertinent labs & imaging results that were available during my care of the patient were reviewed by me and considered in my medical decision making (see chart  for details).    MDM Rules/Calculators/A&P                          Consult: Patient admitted to hospital service.  Patient is chronic alcohol consumption of 10 or more beers per day.  His last drink was early this morning.  Patient has extreme tachycardia and tremulousness.  He has developed vomiting.  Fluid resuscitation initiated with a liter of lactated Ringer's.  Also multivitamin folate infusion initiated.  Patient is given Zofran for nausea and Ativan for withdrawal symptoms.  At this time with high heart rate and significant tremor will plan for admission for active alcohol withdrawal.  Patient has mild rhabdomyolysis but renal function intact.  He was found lying in the street and brought by EMS.  Specific amount of downtime unknown.  At this time, patient is alert airway is protected he is tremulous but without focal motor deficit. Final Clinical Impression(s) / ED Diagnoses Final diagnoses:  Alcohol withdrawal syndrome with complication [facility])    Rx / DC Orders ED Discharge Orders    None       Arby Barrette, MD 06/03/20 2250

## 2020-06-03 NOTE — H&P (Signed)
TRH H&P    Patient Demographics:    Vincent Peters, is a 39 y.o. male  MRN: [id number]  DOB - [birth year]  Admit Date - 06/03/2020  Referring MD/NP/PA: Broadus JohnPfieffer  Outpatient Primary MD for the patient is Patient, No Pcp Per  Patient coming from: Home  Chief complaint- found down in street, picked up by EMS   HPI:    Vincent Peters  is a [age] y.o. male, with history of atrial fibrillation, dilated cardiomyopathy with an LVEF of 20 to 30%, hypertension, polysubstance abuse, and more who presents to the ED via EMS.  Chief complaint -someone called EMS because patient was unconscious in the middle of the street.  Patient does not speak [place], translator via iPad was attempted.  Translator repeated questions several times, we tried several different questions, patient was not answering questions.  ED provider reports that he was answering questions for her.  He reports that he typically drinks 10 beers per day.  He had 5 beers this morning before he went to work.  He has not drink anything since which is abnormal for him.  He got shaky and had nausea and vomiting at work.  He was denying hallucinations.  He did admit to a mild headache for her no visual change.  Again patient would answer any of these questions for me with the use of a translator.  Chart review reveals that patient was admitted in February 2021 for alcohol withdrawal as well.  At that time he had tremulousness, restlessness, narrow complex tachycardia as well as A. fib with RVR.  He transiently required a Cardizem drip.  Cardiology did see him during that hospital course, and he followed up with cardiology after the discharge as well.  He had echocardiogram at that time that showed systolic CHF with an EF of 20 to 30%.  Patient was started on Entresto during this hospitalization as well as metoprolol.  He was not started on a statin  and that is likely because of his alcoholic hepatitis with transaminitis and hyperammonemia during that stay.  His withdrawal symptoms worsen during his hospitalization and he required the Precedex drip for 3 days.  For this reason patient will be admitted to progressive care here in case he also requires Precedex during this hospitalization.  In the ED Temperature nine 9.5, heart rate 111, respiratory rate 20, blood pressure 151/92, maintaining oxygen saturations on room air. Leukocytosis at 13.5, thrombocytopenia at 82 CHEM panel revealed a low bicarb at 17, gap of 25 CPK was 1885 UDS negative Alcohol level not detectable Ammonia 50 EKG showed a heart rate of 115, sinus tach, QTC 460, possible ischemia in the lateral leads Patient was started on [place] protocol Was given a banana bag Zofran also given Admission requested for management of DTs   Review of systems:    In addition to the HPI above,  Review of Systems  Unable to perform ROS: Mental status change       Past History of the following :    Past Medical History:  Diagnosis  Date  . Alcohol abuse   . Cocaine abuse (HCC)   . Hypertension       Past Surgical History:  Procedure Laterality Date  . NO PAST SURGERIES        Social History:      Social History   Tobacco Use  . Smoking status: Light Tobacco Smoker    Types: Cigarettes  . Smokeless tobacco: Never Used  Substance Use Topics  . Alcohol use: Yes    Alcohol/week: 4.0 standard drinks    Types: 4 Cans of beer per week       Family History :    No family history on file. Could not be reviewed as patient is not currently answering questions   Home Medications:   Prior to Admission medications   Medication Sig Start Date End Date Taking? Authorizing Provider  apixaban (ELIQUIS) 5 MG TABS tablet Take 1 tablet (5 mg total) by mouth 2 (two) times daily. 09/29/19   Alessandra Bevels, MD  lactulose (CHRONULAC) 10 GM/15ML solution Take 30 mLs (20  g total) by mouth daily. 09/29/19   Alessandra Bevels, MD  metoprolol succinate (TOPROL-XL) 200 MG 24 hr tablet Take 1 tablet (200 mg total) by mouth daily. Take with or immediately following a meal. 09/30/19   Alessandra Bevels, MD  Multiple Vitamin (MULTIVITAMIN WITH MINERALS) TABS tablet Take 1 tablet by mouth daily. 09/29/19   Alessandra Bevels, MD  ondansetron (ZOFRAN) 4 MG tablet Take 1 tablet (4 mg total) by mouth every 8 (eight) hours as needed for nausea or vomiting. 09/29/19   Alessandra Bevels, MD  pantoprazole (PROTONIX) 40 MG tablet Take 1 tablet (40 mg total) by mouth daily. 09/29/19   Alessandra Bevels, MD  sacubitril-valsartan (ENTRESTO) 49-51 MG Take 1 tablet by mouth 2 (two) times daily. 09/29/19   Alessandra Bevels, MD     Allergies:    No Known Allergies   Physical Exam:   Vitals  Blood pressure (!) 173/97, pulse (!) 121, temperature 99.5 F (37.5 C), temperature source Oral, resp. rate 16, SpO2 98 %.  1.  General: Lying supine in bed with head of bed elevated, appears older than stated age, no acute distress  2. Psychiatric: Not answering questions, alert  3. Neurologic: Tremulous, moves all 4 extremities voluntarily, confused, not currently answering questions so difficult to assess sensory function  4. HEENMT:  Head is atraumatic, normocephalic, pupils reactive to light, neck is supple, trachea is midline, mucous membranes are moist, acne over face and chest  5. Respiratory : Lungs are clear to auscultation bilaterally  6. Cardiovascular : Tachycardic rate, regular rhythm, no peripheral edema, no rubs or gallops  7. Gastrointestinal:  Abdomen is soft, nondistended, seems to be guarding left side-will not answer if there is pain there, normoactive bowel sounds  8. Skin:  Acne over face as described above, no other acute lesions on limited skin exam  9.Musculoskeletal:  No peripheral edema, acute deformity, does not grimace to calf Palpation    Data  Review:    CBC Recent Labs  Lab 06/03/20 1957  WBC 13.5*  HGB 13.1  HCT 39.8  PLT 82*  MCV 100.8*  MCH 33.2  MCHC 32.9  RDW 12.5   ------------------------------------------------------------------------------------------------------------------  Results for orders placed or performed during the hospital encounter of 06/03/20 (from the past 48 hour(s))  Comprehensive metabolic panel     Status: Abnormal   Collection Time: 06/03/20  7:57 PM  Result Value Ref Range   Sodium 139 135 -  145 mmol/L   Potassium 4.0 3.5 - 5.1 mmol/L   Chloride 97 (L) 98 - 111 mmol/L   CO2 17 (L) 22 - 32 mmol/L   Glucose, Bld 119 (H) 70 - 99 mg/dL    Comment: Glucose reference range applies only to samples taken after fasting for at least 8 hours.   BUN 10 6 - 20 mg/dL   Creatinine, Ser 9.14 (L) 0.61 - 1.24 mg/dL   Calcium 9.0 8.9 - 78.2 mg/dL   Total Protein 7.8 6.5 - 8.1 g/dL   Albumin 3.3 (L) 3.5 - 5.0 g/dL   AST 956 (H) 15 - 41 U/L   ALT 64 (H) 0 - 44 U/L   Alkaline Phosphatase 106 38 - 126 U/L   Total Bilirubin 2.2 (H) 0.3 - 1.2 mg/dL   GFR, Estimated >21 >30 mL/min    Comment: (NOTE) Calculated using the CKD-EPI Creatinine Equation (2021)    Anion gap 25 (H) 5 - 15    Comment: Performed at [facility], [address] [place], [place] [address]  Ethanol     Status: None   Collection Time: 06/03/20  7:57 PM  Result Value Ref Range   Alcohol, Ethyl (B) <10 <10 mg/dL    Comment: (NOTE) Lowest detectable limit for serum alcohol is 10 mg/dL.  For medical purposes only. Performed at [facility], [address], [place], [place] [address]   cbc     Status: Abnormal   Collection Time: 06/03/20  7:57 PM  Result Value Ref Range   WBC 13.5 (H) 4.0 - 10.5 K/uL   RBC 3.95 (L) 4.22 - 5.81 MIL/uL   Hemoglobin 13.1 13.0 - 17.0 g/dL   HCT 95.2 39 - 52 %   MCV 100.8 (H) 80.0 - 100.0 fL   MCH 33.2 26.0 - 34.0 pg   MCHC 32.9 30.0 - 36.0 g/dL   RDW 84.1  32.4 - 40.1 %   Platelets 82 (L) 150 - 400 K/uL    Comment: REPEATED TO VERIFY PLATELET COUNT CONFIRMED BY SMEAR SPECIMEN CHECKED FOR CLOTS Immature Platelet Fraction may be clinically indicated, consider ordering this additional test [id number]    nRBC 0.0 0.0 - 0.2 %    Comment: Performed at [facility], [address] [place], [place] [address]  Rapid urine drug screen (hospital performed)     Status: None   Collection Time: 06/03/20  7:57 PM  Result Value Ref Range   Opiates NONE DETECTED NONE DETECTED   Cocaine NONE DETECTED NONE DETECTED   Benzodiazepines NONE DETECTED NONE DETECTED   Amphetamines NONE DETECTED NONE DETECTED   Tetrahydrocannabinol NONE DETECTED NONE DETECTED   Barbiturates NONE DETECTED NONE DETECTED    Comment: (NOTE) DRUG SCREEN FOR MEDICAL PURPOSES ONLY.  IF CONFIRMATION IS NEEDED FOR ANY PURPOSE, NOTIFY LAB WITHIN 5 DAYS.  LOWEST DETECTABLE LIMITS FOR URINE DRUG SCREEN Drug Class                     Cutoff (ng/mL) Amphetamine and metabolites    1000 Barbiturate and metabolites    200 Benzodiazepine                 200 Tricyclics and metabolites     300 Opiates and metabolites        300 Cocaine and metabolites        300 THC  50 Performed at [facility], [address] [place], [place] [address]   CK     Status: Abnormal   Collection Time: 06/03/20  7:57 PM  Result Value Ref Range   Total CK 1,885 (H) 49.0 - 397.0 U/L    Comment: Performed at [facility], [address], [place], [place] [address]  Ammonia     Status: Abnormal   Collection Time: 06/03/20  9:06 PM  Result Value Ref Range   Ammonia 50 (H) 9 - 35 umol/L    Comment: Performed at [facility], [address], [place], [place] [address]    Chemistries  Recent Labs  Lab 06/03/20 1957  NA 139  K 4.0  CL 97*  CO2 17*  GLUCOSE 119*  BUN 10  CREATININE 0.56*  CALCIUM  9.0  AST 163*  ALT 64*  ALKPHOS 106  BILITOT 2.2*   ------------------------------------------------------------------------------------------------------------------  ------------------------------------------------------------------------------------------------------------------ GFR: CrCl cannot be calculated (Unknown ideal weight.). Liver Function Tests: Recent Labs  Lab 06/03/20 1957  AST 163*  ALT 64*  ALKPHOS 106  BILITOT 2.2*  PROT 7.8  ALBUMIN 3.3*   No results for input(s): LIPASE, AMYLASE in the last 168 hours. Recent Labs  Lab 06/03/20 2106  AMMONIA 50*   Coagulation Profile: No results for input(s): INR, PROTIME in the last 168 hours. Cardiac Enzymes: Recent Labs  Lab 06/03/20 1957  CKTOTAL 1,885*   BNP (last 3 results) No results for input(s): PROBNP in the last 8760 hours. HbA1C: No results for input(s): HGBA1C in the last 72 hours. CBG: No results for input(s): GLUCAP in the last 168 hours. Lipid Profile: No results for input(s): CHOL, HDL, LDLCALC, TRIG, CHOLHDL, LDLDIRECT in the last 72 hours. Thyroid Function Tests: No results for input(s): TSH, T4TOTAL, FREET4, T3FREE, THYROIDAB in the last 72 hours. Anemia Panel: No results for input(s): VITAMINB12, FOLATE, FERRITIN, TIBC, IRON, RETICCTPCT in the last 72 hours.  --------------------------------------------------------------------------------------------------------------- Urine analysis:    Component Value Date/Time   COLORURINE YELLOW 09/23/2019 1416   APPEARANCEUR CLEAR 09/23/2019 1416   LABSPEC 1.012 09/23/2019 1416   PHURINE 7.0 09/23/2019 1416   GLUCOSEU 50 (A) 09/23/2019 1416   HGBUR MODERATE (A) 09/23/2019 1416   BILIRUBINUR NEGATIVE 09/23/2019 1416   KETONESUR 20 (A) 09/23/2019 1416   PROTEINUR >=300 (A) 09/23/2019 1416   UROBILINOGEN 0.2 01/17/2014 0136   NITRITE NEGATIVE 09/23/2019 1416   LEUKOCYTESUR NEGATIVE 09/23/2019 1416      Imaging Results:    No results  found.  My personal review of EKG: Rhythm sinus tachycardia rate 115/min, QTc 460  repolarization abnormality as discussed below  Assessment & Plan:    Active Problems:   Delirium tremens (HCC)   1. Metabolic encephalopathy 1. Alert but confused, nonverbal 2. Likely secondary to alcohol withdrawal 3. Heavy alcohol use, recently tried to quit, EtOH level is less than 10 4. Salicylate and Tylenol level also pending 5. TSH pending 6. Banana bag with thiamine being given now 7. UDS negative 8. Continue to monitor 2. Metabolic acidosis 1. Bicarb 17 2. Gap 25 3. VBG pending 4. Likely secondary to rhabdomyolysis 5. Lactic acid pending 6. Continue to monitor 3. Delirium tremens secondary to alcohol withdrawal 1. Secondary to alcohol withdrawal 2. Currently using CIWA protocol with Ativan 3. Admitting to progressive care unit in case patient should need Precedex drip 4. Patient did require Precedex drip on last admission 4. Polysubstance abuse 1. History of cocaine and alcohol abuse 2.  Currently UDS negative and alcohol less than 10 3. Patient currently in withdrawal 4. Continue to monitor 5. Transaminitis 1. AST is 163, ALT is 64 2. Secondary to alcohol abuse most likely 3. Continue gentle hydration 4. Trend in the a.m. 6. Thrombocytopenia 1. Secondary to alcohol abuse 2. Down from 176 on last admission to 82 3. Continue to trend 7. Rhabdomyolysis 1. Found down for unknown amount of time 2. CPK 1885 3. Gentle hydration in the setting of CHF with an EF of 20 to 30% 4. Trend in the a.m. 8. Protein calorie malnutrition 1. Encourage nutrient dense food choices 2. Albumin is 3.3 3. Continue to monitor 9. Dilated cardiomyopathy/CHF 1. Continue Entresto, metoprolol 2. Does not appear to be in exacerbation 3. Continue to monitor 10. Abnormal EKG 1. Abnormal EKG with narrow complex tachycardia and repolarization abnormality in the lateral leads 2. Patient was also found  down 3. Troponin x2 ordered 4. Monitor on telemetry 11. A. Fib 1. Patient A. fib with RVR last admission 2. He did present to cardiology 1 time after last admission but does not appear to have had any visits since then 3. Continue Eliquis and metoprolol    DVT Prophylaxis-   Eliquis- SCDs   AM Labs Ordered, also please review Full Orders  Family Communication: No family at bedside  Code Status: Full  Admission status: Inpatient :The appropriate admission status for this patient is INPATIENT. Inpatient status is judged to be reasonable and necessary in order to provide the required intensity of service to ensure the patient's safety. The patient's presenting symptoms, physical exam findings, and initial radiographic and laboratory data in the context of their chronic comorbidities is felt to place them at high risk for further clinical deterioration. Furthermore, it is not anticipated that the patient will be medically stable for discharge from the hospital within 2 midnights of admission. The following factors support the admission status of inpatient.     The patient's presenting symptoms include altered mental status The worrisome physical exam findings include metabolic encephalopathy The initial radiographic and laboratory data are worrisome because of CPK of 1885, bicarb 17, gap 25, leukocytosis 13.5 The chronic co-morbidities include atrial fibrillation, dilated cardiomyopathy with an LVEF of 20 to 30%, hypertension, polysubstance abuse       * I certify that at the point of admission it is my clinical judgment that the patient will require inpatient hospital care spanning beyond 2 midnights from the point of admission due to high intensity of service, high risk for further deterioration and high frequency of surveillance required.*  Time spent in minutes : 64   Constance Whittle B Zierle-Ghosh DO

## 2020-06-03 NOTE — ED Triage Notes (Signed)
EMS was called out for unconscious person in the street. Hx of ETOH and drug abuse. A&O x 2 (person, place). Denies physical complaints. CBG 97.

## 2020-06-04 ENCOUNTER — Encounter (HOSPITAL_COMMUNITY): Payer: Self-pay | Admitting: Family Medicine

## 2020-06-04 LAB — LACTIC ACID, PLASMA: Lactic Acid, Venous: 1.8 mmol/L (ref 0.5–1.9)

## 2020-06-04 LAB — TSH: TSH: 1.222 u[IU]/mL (ref 0.350–4.500)

## 2020-06-04 LAB — CBC WITH DIFFERENTIAL/PLATELET
Abs Immature Granulocytes: 0.04 10*3/uL (ref 0.00–0.07)
Basophils Absolute: 0.1 10*3/uL (ref 0.0–0.1)
Basophils Relative: 1 %
Eosinophils Absolute: 0 10*3/uL (ref 0.0–0.5)
Eosinophils Relative: 0 %
HCT: 37.9 % — ABNORMAL LOW (ref 39.0–52.0)
Hemoglobin: 12.9 g/dL — ABNORMAL LOW (ref 13.0–17.0)
Immature Granulocytes: 0 %
Lymphocytes Relative: 13 %
Lymphs Abs: 1.2 10*3/uL (ref 0.7–4.0)
MCH: 32.9 pg (ref 26.0–34.0)
MCHC: 34 g/dL (ref 30.0–36.0)
MCV: 96.7 fL (ref 80.0–100.0)
Monocytes Absolute: 1.4 10*3/uL — ABNORMAL HIGH (ref 0.1–1.0)
Monocytes Relative: 15 %
Neutro Abs: 6.6 10*3/uL (ref 1.7–7.7)
Neutrophils Relative %: 71 %
Platelets: 60 10*3/uL — ABNORMAL LOW (ref 150–400)
RBC: 3.92 MIL/uL — ABNORMAL LOW (ref 4.22–5.81)
RDW: 12.1 % (ref 11.5–15.5)
WBC: 9.4 10*3/uL (ref 4.0–10.5)
nRBC: 0 % (ref 0.0–0.2)

## 2020-06-04 LAB — COMPREHENSIVE METABOLIC PANEL
ALT: 55 U/L — ABNORMAL HIGH (ref 0–44)
AST: 135 U/L — ABNORMAL HIGH (ref 15–41)
Albumin: 2.9 g/dL — ABNORMAL LOW (ref 3.5–5.0)
Alkaline Phosphatase: 88 U/L (ref 38–126)
Anion gap: 12 (ref 5–15)
BUN: 10 mg/dL (ref 6–20)
CO2: 26 mmol/L (ref 22–32)
Calcium: 8.1 mg/dL — ABNORMAL LOW (ref 8.9–10.3)
Chloride: 95 mmol/L — ABNORMAL LOW (ref 98–111)
Creatinine, Ser: 0.48 mg/dL — ABNORMAL LOW (ref 0.61–1.24)
GFR, Estimated: >60 mL/min (ref >60)
Glucose, Bld: 104 mg/dL — ABNORMAL HIGH (ref 70–99)
Potassium: 3 mmol/L — ABNORMAL LOW (ref 3.5–5.1)
Sodium: 133 mmol/L — ABNORMAL LOW (ref 135–145)
Total Bilirubin: 1.6 mg/dL — ABNORMAL HIGH (ref 0.3–1.2)
Total Protein: 6.9 g/dL (ref 6.5–8.1)

## 2020-06-04 LAB — CK: Total CK: 1844 U/L — ABNORMAL HIGH (ref 49–397)

## 2020-06-04 LAB — ACETAMINOPHEN LEVEL: Acetaminophen (Tylenol), Serum: <10 ug/mL — ABNORMAL LOW (ref 10–30)

## 2020-06-04 LAB — PROTIME-INR
INR: 1.2 (ref 0.8–1.2)
Prothrombin Time: 14.7 seconds (ref 11.4–15.2)

## 2020-06-04 LAB — MAGNESIUM: Magnesium: 1.4 mg/dL — ABNORMAL LOW (ref 1.7–2.4)

## 2020-06-04 LAB — TROPONIN I (HIGH SENSITIVITY): Troponin I (High Sensitivity): 23 ng/L — ABNORMAL HIGH (ref <18)

## 2020-06-04 LAB — HIV ANTIBODY (ROUTINE TESTING W REFLEX): HIV Screen 4th Generation wRfx: NONREACTIVE

## 2020-06-04 LAB — SALICYLATE LEVEL: Salicylate Lvl: <7 mg/dL — ABNORMAL LOW (ref 7.0–30.0)

## 2020-06-04 MED ORDER — HALOPERIDOL 1 MG PO TABS: 5.0000 mg | ORAL_TABLET | Freq: Three times a day (TID) | ORAL | Status: AC | PRN

## 2020-06-04 MED ORDER — LORAZEPAM 2 MG/ML IJ SOLN
INTRAMUSCULAR | Status: AC
  Administered 2020-06-04: 4 mg
  Filled 2020-06-04: qty 2

## 2020-06-04 MED ORDER — SODIUM CHLORIDE 0.9 % IV BOLUS
500.0000 mL | Freq: Once | INTRAVENOUS | Status: AC
  Administered 2020-06-04: 500 mL via INTRAVENOUS

## 2020-06-04 MED ORDER — CHLORHEXIDINE GLUCONATE CLOTH 2 % EX PADS
6.0000 | MEDICATED_PAD | Freq: Every day | CUTANEOUS | Status: DC
  Administered 2020-06-05 – 2020-06-11 (×7): 6 via TOPICAL

## 2020-06-04 MED ORDER — METOPROLOL SUCCINATE ER 100 MG PO TB24
100.0000 mg | ORAL_TABLET | Freq: Every day | ORAL | Status: DC
  Administered 2020-06-04 – 2020-06-12 (×7): 100 mg via ORAL
  Filled 2020-06-04: qty 4
  Filled 2020-06-04 (×2): qty 1
  Filled 2020-06-04 (×2): qty 4
  Filled 2020-06-04 (×2): qty 1
  Filled 2020-06-04: qty 4

## 2020-06-04 MED ORDER — MAGNESIUM SULFATE 2 GM/50ML IV SOLN
2.0000 g | Freq: Once | INTRAVENOUS | Status: AC
  Administered 2020-06-04: 2 g via INTRAVENOUS
  Filled 2020-06-04: qty 50

## 2020-06-04 MED ORDER — HALOPERIDOL LACTATE 5 MG/ML IJ SOLN
INTRAMUSCULAR | Status: AC
  Filled 2020-06-04: qty 1

## 2020-06-04 MED ORDER — CHLORDIAZEPOXIDE HCL 5 MG PO CAPS: 5.0000 mg | ORAL_CAPSULE | Freq: Three times a day (TID) | ORAL | Status: DC

## 2020-06-04 MED ORDER — POTASSIUM CHLORIDE CRYS ER 20 MEQ PO TBCR
40.0000 meq | EXTENDED_RELEASE_TABLET | Freq: Once | ORAL | Status: AC
  Administered 2020-06-04: 40 meq via ORAL
  Filled 2020-06-04: qty 2

## 2020-06-04 MED ORDER — HALOPERIDOL LACTATE 5 MG/ML IJ SOLN
5.0000 mg | Freq: Three times a day (TID) | INTRAMUSCULAR | Status: AC | PRN
  Administered 2020-06-04: 5 mg via INTRAMUSCULAR

## 2020-06-04 MED ORDER — LORAZEPAM 2 MG/ML IJ SOLN
1.0000 mg | Freq: Once | INTRAMUSCULAR | Status: AC
  Administered 2020-06-04: 1 mg via INTRAVENOUS
  Filled 2020-06-04: qty 1

## 2020-06-04 MED ORDER — DEXMEDETOMIDINE HCL IN NACL 200 MCG/50ML IV SOLN
0.4000 ug/kg/h | INTRAVENOUS | Status: DC
  Administered 2020-06-04 – 2020-06-05 (×2): 0.4 ug/kg/h via INTRAVENOUS
  Administered 2020-06-05: 0.5 ug/kg/h via INTRAVENOUS
  Filled 2020-06-04 (×3): qty 50

## 2020-06-04 NOTE — Progress Notes (Signed)
Patient noted restless, getting up and down out of bed, removed tele x5, tele now on hold. IVF on hold due to pulling at the line and tangling in tubing causing a trip hazzard, patient clammy and anxious, non comprehending instructions even with translator assistance, sitter is with patient at this time for safety. MD notifed and new orders obtained. Spoke with pharmacy concerning newly ordered medication and pixis to be loaded. Currently awaiting on pharmacy to load med in pixis.

## 2020-06-04 NOTE — Progress Notes (Signed)
PROGRESS NOTE  Vincent Peters ACZ:[id number] DOB: 11/09/1980 DOA: 06/03/2020 PCP: Patient, No Pcp Per   LOS: 1 day   Brief Narrative / Interim history: 39 y.o. male, with history of atrial fibrillation, dilated cardiomyopathy with an LVEF of 20 to 30%, hypertension, polysubstance abuse, and more who presents to the ED via EMS.  Chief complaint -someone called EMS because patient was unconscious in the middle of the street.  Patient is very vague in telling me how he got there.  Reports drinking 5 beers yesterday.  He tells me he has not been eating anything in the last 48 hours because he has no money. Chart review reveals that patient was admitted in February 2021 for alcohol withdrawal as well.  At that time he had tremulousness, restlessness, narrow complex tachycardia as well as A. fib with RVR.  He transiently required a Cardizem drip.  Cardiology did see him during that hospital course, and he followed up with cardiology after the discharge as well.  He had echocardiogram at that time that showed systolic CHF with an EF of 20 to 30%.  Patient was started on Entresto during this hospitalization as well as metoprolol.  He was not started on a statin and that is likely because of his alcoholic hepatitis with transaminitis and hyperammonemia during that stay.  His withdrawal symptoms worsen during his hospitalization and he required the Precedex drip for 3 days.  Today also reports nonadherence to home medication regimen  Subjective / 24h Interval events: -States that he is feeling better this morning, however is tremulous.  Has some confusion on my interview.  Assessment & Plan: Principal Problem Alcohol withdrawal, high risk for delirium tremens, acute metabolic encephalopathy -Patient is confused to the year but overall appropriate and understands the situation -Continue to monitor in progressive, high risk of requiring Precedex as last time.  Continue CIWA -UDS was negative on  admission.  Has a history of cocaine use  Active Problems Rhabdomyolysis -He was found down for an unknown amount of time, CK around 1800, stable today, continue limited IV fluids 500 cc bolus given heart failure.  Recheck CK in the morning  Chronic systolic CHF -With an EF of 20-30%, patient is noncompliant with his home medications, resume here and monitor  Paroxysmal A. fib -Continue metoprolol, Eliquis.  Again he is noncompliant  Elevated LFTs, thrombocytopenia -Likely has underlying liver disease due to ongoing EtOH.  Monitor.  Hyponatremia -Slightly dehydrated, limited bolus  Hypokalemia, hypomagnesemia -Due to poor p.o. intake  Anemia -Likely due to liver disease, he is macrocytic.  Check B12, folate   Scheduled Meds: . apixaban  5 mg Oral BID  . folic acid  1 mg Oral Daily  . lactulose  20 g Oral Daily  . LORazepam  0-4 mg Intravenous Q6H   Or  . LORazepam  0-4 mg Oral Q6H  . [START ON 06/06/2020] LORazepam  0-4 mg Intravenous Q12H   Or  . [START ON 06/06/2020] LORazepam  0-4 mg Oral Q12H  . metoprolol  100 mg Oral Daily  . multivitamin with minerals  1 tablet Oral Daily  . pantoprazole  40 mg Oral Daily  . sacubitril-valsartan  1 tablet Oral BID  . thiamine  100 mg Oral Daily   Or  . thiamine  100 mg Intravenous Daily  . thiamine  100 mg Oral Daily   Continuous Infusions: PRN Meds:.ibuprofen, ondansetron **OR** ondansetron (ZOFRAN) IV, traMADol  Diet Orders (From admission, onward)    Start  Ordered   06/03/20 2325  Diet Heart Room service appropriate? Yes; Fluid consistency: Thin  Diet effective now       Question Answer Comment  Room service appropriate? Yes   Fluid consistency: Thin      06/03/20 2324          DVT prophylaxis: SCDs Start: 06/03/20 2325 apixaban (ELIQUIS) tablet 5 mg     Code Status: Full Code  Family Communication: no family at bedside   Status is: Inpatient  Remains inpatient appropriate because:Inpatient level of  care appropriate due to severity of illness   Dispo: The patient is from: Home              Anticipated d/c is to: Home              Anticipated d/c date is: 2 days              Patient currently is not medically stable to d/c.  Consultants:  None   Procedures:  None   Microbiology  None   Antimicrobials: None     Objective: Vitals:   06/04/20 0745 06/04/20 0900 06/04/20 0943 06/04/20 1000  BP: (!) 150/89  135/82   Pulse: 92  94   Resp: 16  20   Temp:   99.3 F (37.4 C)   TempSrc:   Oral   SpO2: 97%  97% 98%  Weight:  67.8 kg    Height:  5\' 9"  (1.753 m)      Intake/Output Summary (Last 24 hours) at 06/04/2020 1118 Last data filed at 06/04/2020 0534 Gross per 24 hour  Intake 2000 ml  Output --  Net 2000 ml   Filed Weights   06/04/20 0900  Weight: 67.8 kg    Examination:  Constitutional: NAD Eyes: no scleral icterus ENMT: Mucous membranes are moist.  Neck: normal, supple Respiratory: clear to auscultation bilaterally, no wheezing, no crackles.   Cardiovascular: Regular rate and rhythm, no murmurs / rubs / gallops. No LE edema.  Abdomen: non distended, no tenderness. Bowel sounds positive.  Musculoskeletal: no clubbing / cyanosis.  Skin: Seborrheic dermatitis changes on face with scabbed lesions Neurologic: non focal. Tremulous  Data Reviewed: I have independently reviewed following labs and imaging studies   CBC: Recent Labs  Lab 06/03/20 1957 06/04/20 0347  WBC 13.5* 9.4  NEUTROABS  --  6.6  HGB 13.1 12.9*  HCT 39.8 37.9*  MCV 100.8* 96.7  PLT 82* 60*   Basic Metabolic Panel: Recent Labs  Lab 06/03/20 1957 06/04/20 0347  NA 139 133*  K 4.0 3.0*  CL 97* 95*  CO2 17* 26  GLUCOSE 119* 104*  BUN 10 10  CREATININE 0.56* 0.48*  CALCIUM 9.0 8.1*  MG  --  1.4*   Liver Function Tests: Recent Labs  Lab 06/03/20 1957 06/04/20 0347  AST 163* 135*  ALT 64* 55*  ALKPHOS 106 88  BILITOT 2.2* 1.6*  PROT 7.8 6.9  ALBUMIN 3.3* 2.9*    Coagulation Profile: Recent Labs  Lab 06/04/20 0347  INR 1.2   HbA1C: No results for input(s): HGBA1C in the last 72 hours. CBG: No results for input(s): GLUCAP in the last 168 hours.  Recent Results (from the past 240 hour(s))  Respiratory Panel by RT PCR (Flu A&B, Covid) -     Status: None   Collection Time: 06/03/20  9:45 PM  Result Value Ref Range Status   SARS Coronavirus 2 by RT PCR NEGATIVE NEGATIVE Final  Comment: (NOTE) SARS-CoV-2 target nucleic acids are NOT DETECTED.  The SARS-CoV-2 RNA is generally detectable in upper respiratoy specimens during the acute phase of infection. The lowest concentration of SARS-CoV-2 viral copies this assay can detect is 131 copies/mL. A negative result does not preclude SARS-Cov-2 infection and should not be used as the sole basis for treatment or other patient management decisions. A negative result may occur with  improper specimen collection/handling, submission of specimen other than nasopharyngeal swab, presence of viral mutation(s) within the areas targeted by this assay, and inadequate number of viral copies (<131 copies/mL). A negative result must be combined with clinical observations, patient history, and epidemiological information. The expected result is Negative.  Fact Sheet for Patients:  https://www.moore.com/  Fact Sheet for Healthcare Providers:  https://www.young.biz/  This test is no t yet approved or cleared by the [place] FDA and  has been authorized for detection and/or diagnosis of SARS-CoV-2 by FDA under an Emergency Use Authorization (EUA). This EUA will remain  in effect (meaning this test can be used) for the duration of the COVID-19 declaration under Section 564(b)(1) of the Act, 21 U.S.C. section 360bbb-3(b)(1), unless the authorization is terminated or revoked sooner.     Influenza A by PCR NEGATIVE NEGATIVE Final   Influenza B by PCR NEGATIVE  NEGATIVE Final    Comment: (NOTE) The Xpert Xpress SARS-CoV-2/FLU/RSV assay is intended as an aid in  the diagnosis of influenza from Nasopharyngeal swab specimens and  should not be used as a sole basis for treatment. Nasal washings and  aspirates are unacceptable for Xpert Xpress SARS-CoV-2/FLU/RSV  testing.  Fact Sheet for Patients: https://www.moore.com/  Fact Sheet for Healthcare Providers: https://www.young.biz/  This test is not yet approved or cleared by the [place] FDA and  has been authorized for detection and/or diagnosis of SARS-CoV-2 by  FDA under an Emergency Use Authorization (EUA). This EUA will remain  in effect (meaning this test can be used) for the duration of the  Covid-19 declaration under Section 564(b)(1) of the Act, 21  U.S.C. section 360bbb-3(b)(1), unless the authorization is  terminated or revoked. Performed at [facility], [address], [place], [place] [address]      Radiology Studies: No results found.    Pamella Pert, MD, PhD [facility]  Between 7 am - 7 pm I am available, please contact me via Amion or Securechat  Between 7 pm - 7 am I am not available, please contact night coverage MD/APP via Amion

## 2020-06-04 NOTE — Progress Notes (Signed)
Rapid Response Event Note   Reason for Call :  Elevated CIWA    Initial Focused Assessment:  CIWA 25- 29, Security at bedside Patient agitated, pulling at lines, refusing to wear his tele,  spanish speaking staff at bedside, providing Interpretation at this time, does not appear to comprehend instructions.  Provider notified of CIWA       Interventions:  Restraints Bilat on all extremities  NP at bedside    Plan of Care:  Transfer to ICU for closer monitioring    Patient transferred to room 1228 for increased agitation, to monitor for detox with possible need to start Precedex gtt.    MD Notified: M. Katherina Right FNP  Call Time: 2200 Arrival Time: 2205 End Time: 2230   [facility] MSN, RN-BC  [facility] ICU/Stepdown  [facility]

## 2020-06-05 ENCOUNTER — Other Ambulatory Visit: Payer: Self-pay

## 2020-06-05 LAB — COMPREHENSIVE METABOLIC PANEL
ALT: 55 U/L — ABNORMAL HIGH (ref 0–44)
AST: 144 U/L — ABNORMAL HIGH (ref 15–41)
Albumin: 2.7 g/dL — ABNORMAL LOW (ref 3.5–5.0)
Alkaline Phosphatase: 79 U/L (ref 38–126)
Anion gap: 10 (ref 5–15)
BUN: 6 mg/dL (ref 6–20)
CO2: 25 mmol/L (ref 22–32)
Calcium: 8.6 mg/dL — ABNORMAL LOW (ref 8.9–10.3)
Chloride: 102 mmol/L (ref 98–111)
Creatinine, Ser: 0.43 mg/dL — ABNORMAL LOW (ref 0.61–1.24)
GFR, Estimated: >60 mL/min (ref >60)
Glucose, Bld: 117 mg/dL — ABNORMAL HIGH (ref 70–99)
Potassium: 3 mmol/L — ABNORMAL LOW (ref 3.5–5.1)
Sodium: 137 mmol/L (ref 135–145)
Total Bilirubin: 1.3 mg/dL — ABNORMAL HIGH (ref 0.3–1.2)
Total Protein: 6.4 g/dL — ABNORMAL LOW (ref 6.5–8.1)

## 2020-06-05 LAB — IRON AND TIBC
Iron: 73 ug/dL (ref 45–182)
Saturation Ratios: 25 % (ref 17.9–39.5)
TIBC: 296 ug/dL (ref 250–450)
UIBC: 223 ug/dL

## 2020-06-05 LAB — CBC
HCT: 37.4 % — ABNORMAL LOW (ref 39.0–52.0)
Hemoglobin: 12.4 g/dL — ABNORMAL LOW (ref 13.0–17.0)
MCH: 33.2 pg (ref 26.0–34.0)
MCHC: 33.2 g/dL (ref 30.0–36.0)
MCV: 100 fL (ref 80.0–100.0)
Platelets: 59 10*3/uL — ABNORMAL LOW (ref 150–400)
RBC: 3.74 MIL/uL — ABNORMAL LOW (ref 4.22–5.81)
RDW: 11.9 % (ref 11.5–15.5)
WBC: 6.2 10*3/uL (ref 4.0–10.5)
nRBC: 0 % (ref 0.0–0.2)

## 2020-06-05 LAB — CK: Total CK: 764 U/L — ABNORMAL HIGH (ref 49–397)

## 2020-06-05 LAB — MAGNESIUM: Magnesium: 1.6 mg/dL — ABNORMAL LOW (ref 1.7–2.4)

## 2020-06-05 LAB — RETICULOCYTES
Immature Retic Fract: 10.8 % (ref 2.3–15.9)
RBC.: 3.72 MIL/uL — ABNORMAL LOW (ref 4.22–5.81)
Retic Count, Absolute: 64.7 10*3/uL (ref 19.0–186.0)
Retic Ct Pct: 1.7 % (ref 0.4–3.1)

## 2020-06-05 LAB — VITAMIN B12: Vitamin B-12: 576 pg/mL (ref 180–914)

## 2020-06-05 LAB — PHOSPHORUS: Phosphorus: 3.2 mg/dL (ref 2.5–4.6)

## 2020-06-05 LAB — FOLATE: Folate: 9 ng/mL (ref >5.9)

## 2020-06-05 LAB — FERRITIN: Ferritin: 121 ng/mL (ref 24–336)

## 2020-06-05 LAB — MRSA PCR SCREENING: MRSA by PCR: NEGATIVE

## 2020-06-05 LAB — BRAIN NATRIURETIC PEPTIDE: B Natriuretic Peptide: 114.6 pg/mL — ABNORMAL HIGH (ref 0.0–100.0)

## 2020-06-05 MED ORDER — MAGNESIUM SULFATE 2 GM/50ML IV SOLN
2.0000 g | Freq: Once | INTRAVENOUS | Status: AC
  Administered 2020-06-05: 2 g via INTRAVENOUS
  Filled 2020-06-05: qty 50

## 2020-06-05 MED ORDER — POTASSIUM CHLORIDE CRYS ER 20 MEQ PO TBCR
40.0000 meq | EXTENDED_RELEASE_TABLET | Freq: Once | ORAL | Status: DC
  Filled 2020-06-05 (×2): qty 2

## 2020-06-05 MED ORDER — POTASSIUM CHLORIDE 10 MEQ/100ML IV SOLN
10.0000 meq | INTRAVENOUS | Status: AC
  Administered 2020-06-05 (×3): 10 meq via INTRAVENOUS
  Filled 2020-06-05 (×3): qty 100

## 2020-06-05 NOTE — Progress Notes (Addendum)
PROGRESS NOTE  Vincent Peters MIW:[id number] DOB: 19-Dec-1980 DOA: 06/03/2020 PCP: Patient, No Pcp Per   LOS: 2 days   Brief Narrative / Interim history: 39 y.o. male, with history of atrial fibrillation, dilated cardiomyopathy with an LVEF of 20 to 30%, hypertension, polysubstance abuse, and more who presents to the ED via EMS.  Chief complaint -someone called EMS because patient was unconscious in the middle of the street.  Patient is very vague in telling me how he got there.  Reports drinking 5 beers yesterday.  He tells me he has not been eating anything in the last 48 hours because he has no money. Chart review reveals that patient was admitted in February 2021 for alcohol withdrawal as well.  At that time he had tremulousness, restlessness, narrow complex tachycardia as well as A. fib with RVR.  He transiently required a Cardizem drip.  Cardiology did see him during that hospital course, and he followed up with cardiology after the discharge as well.  He had echocardiogram at that time that showed systolic CHF with an EF of 20 to 30%.  Patient was started on Entresto during this hospitalization as well as metoprolol.  He was not started on a statin and that is likely because of his alcoholic hepatitis with transaminitis and hyperammonemia during that stay.  His withdrawal symptoms worsen during his hospitalization and he required the Precedex drip for 3 days.  He also reports nonadherence to home medication regimen  Subjective / 24h Interval events: -Very confused this morning, agitated overnight, restless, required transfer to the ICU and initiation of Precedex drip.  Assessment & Plan: Principal Problem Alcohol withdrawal with delirium tremens, acute metabolic encephalopathy -Got worse last night and overnight transferred to the ICU.  Continue Precedex. -UDS was negative on admission.  Has a history of cocaine use  Active Problems Rhabdomyolysis -He was found down for an  unknown amount of time, CK around 1800, stable today, continue limited IV fluids 500 cc bolus given heart failure.  Recheck CK this morning  Chronic systolic CHF -With an EF of 20-30%, patient is noncompliant with his home medications, resume here and monitor.  He appears to be tolerating medications well, his blood pressure is good, continue -He appears euvolemic this morning  Paroxysmal A. fib -Continue metoprolol, Eliquis.   Elevated LFTs, thrombocytopenia -Likely has underlying liver disease due to ongoing EtOH.  Monitor.  Hyponatremia -Due to dehydration, received a limited bolus yesterday and sodium has now normalized  Hypokalemia, hypomagnesemia -Due to poor p.o. intake, continue to replete today potassium and magnesium, continue to monitor  Anemia -Likely due to liver disease, he is macrocytic.  Anemia panel fairly unremarkable   Scheduled Meds: . apixaban  5 mg Oral BID  . Chlorhexidine Gluconate Cloth  6 each Topical Daily  . folic acid  1 mg Oral Daily  . haloperidol lactate      . lactulose  20 g Oral Daily  . LORazepam  0-4 mg Intravenous Q6H   Or  . LORazepam  0-4 mg Oral Q6H  . [START ON 06/06/2020] LORazepam  0-4 mg Intravenous Q12H   Or  . [START ON 06/06/2020] LORazepam  0-4 mg Oral Q12H  . metoprolol  100 mg Oral Daily  . multivitamin with minerals  1 tablet Oral Daily  . pantoprazole  40 mg Oral Daily  . sacubitril-valsartan  1 tablet Oral BID  . thiamine  100 mg Oral Daily   Or  . thiamine  100 mg Intravenous  Daily  . thiamine  100 mg Oral Daily   Continuous Infusions: . dexmedetomidine (PRECEDEX) IV infusion 0.5 mcg/kg/hr (06/05/20 0833)   PRN Meds:.ibuprofen, ondansetron **OR** ondansetron (ZOFRAN) IV, traMADol  Diet Orders (From admission, onward)    Start     Ordered   06/03/20 2325  Diet Heart Room service appropriate? Yes; Fluid consistency: Thin  Diet effective now       Question Answer Comment  Room service appropriate? Yes   Fluid  consistency: Thin      06/03/20 2324          DVT prophylaxis: SCDs Start: 06/03/20 2325 apixaban (ELIQUIS) tablet 5 mg     Code Status: Full Code  Family Communication: no family at bedside   Status is: Inpatient  Remains inpatient appropriate because:Inpatient level of care appropriate due to severity of illness   Dispo: The patient is from: Home              Anticipated d/c is to: Home              Anticipated d/c date is: 2 days              Patient currently is not medically stable to d/c.  Consultants:  None   Procedures:  None   Microbiology  None   Antimicrobials: None     Objective: Vitals:   06/05/20 0500 06/05/20 0600 06/05/20 0700 06/05/20 0800  BP: (!) 140/93 (!) 147/103 (!) 142/112 (!) 142/96  Pulse: (!) 54 (!) 58 89 (!) 53  Resp: 10 (!) 9 16 (!) 9  Temp:      TempSrc:      SpO2: 100% 100% 100% 100%  Weight:      Height:        Intake/Output Summary (Last 24 hours) at 06/05/2020 0902 Last data filed at 06/05/2020 2458 Gross per 24 hour  Intake 3037.74 ml  Output 750 ml  Net 2287.74 ml   Filed Weights   06/04/20 0900  Weight: 67.8 kg    Examination:  Constitutional: In restraints, no apparent distress, sleeping soundly Eyes: No scleral icterus ENMT: Moist mucous membranes Neck: normal, supple Respiratory: Lungs are clear, no wheezing, no crackles Cardiovascular: Regular rate and rhythm, no murmurs, no peripheral edema Abdomen: Nondistended, bowel sounds positive.  No tenderness Musculoskeletal: no clubbing / cyanosis.  Skin: Seborrheic dermatitis changes on face with scabbed lesions Neurologic: No focal deficits, does not follow commands consistently  Data Reviewed: I have independently reviewed following labs and imaging studies   CBC: Recent Labs  Lab 06/03/20 1957 06/04/20 0347 06/05/20 0256  WBC 13.5* 9.4 6.2  NEUTROABS  --  6.6  --   HGB 13.1 12.9* 12.4*  HCT 39.8 37.9* 37.4*  MCV 100.8* 96.7 100.0  PLT 82* 60*  59*   Basic Metabolic Panel: Recent Labs  Lab 06/03/20 1957 06/04/20 0347 06/05/20 0256  NA 139 133* 137  K 4.0 3.0* 3.0*  CL 97* 95* 102  CO2 17* 26 25  GLUCOSE 119* 104* 117*  BUN 10 10 6   CREATININE 0.56* 0.48* 0.43*  CALCIUM 9.0 8.1* 8.6*  MG  --  1.4* 1.6*  PHOS  --   --  3.2   Liver Function Tests: Recent Labs  Lab 06/03/20 1957 06/04/20 0347 06/05/20 0256  AST 163* 135* 144*  ALT 64* 55* 55*  ALKPHOS 106 88 79  BILITOT 2.2* 1.6* 1.3*  PROT 7.8 6.9 6.4*  ALBUMIN 3.3* 2.9* 2.7*  Coagulation Profile: Recent Labs  Lab 06/04/20 0347  INR 1.2   HbA1C: No results for input(s): HGBA1C in the last 72 hours. CBG: No results for input(s): GLUCAP in the last 168 hours.  Recent Results (from the past 240 hour(s))  Respiratory Panel by RT PCR (Flu A&B, Covid) -     Status: None   Collection Time: 06/03/20  9:45 PM  Result Value Ref Range Status   SARS Coronavirus 2 by RT PCR NEGATIVE NEGATIVE Final    Comment: (NOTE) SARS-CoV-2 target nucleic acids are NOT DETECTED.  The SARS-CoV-2 RNA is generally detectable in upper respiratoy specimens during the acute phase of infection. The lowest concentration of SARS-CoV-2 viral copies this assay can detect is 131 copies/mL. A negative result does not preclude SARS-Cov-2 infection and should not be used as the sole basis for treatment or other patient management decisions. A negative result may occur with  improper specimen collection/handling, submission of specimen other than nasopharyngeal swab, presence of viral mutation(s) within the areas targeted by this assay, and inadequate number of viral copies (<131 copies/mL). A negative result must be combined with clinical observations, patient history, and epidemiological information. The expected result is Negative.  Fact Sheet for Patients:  https://www.moore.com/  Fact Sheet for Healthcare Providers:   https://www.young.biz/  This test is no t yet approved or cleared by the [place] FDA and  has been authorized for detection and/or diagnosis of SARS-CoV-2 by FDA under an Emergency Use Authorization (EUA). This EUA will remain  in effect (meaning this test can be used) for the duration of the COVID-19 declaration under Section 564(b)(1) of the Act, 21 U.S.C. section 360bbb-3(b)(1), unless the authorization is terminated or revoked sooner.     Influenza A by PCR NEGATIVE NEGATIVE Final   Influenza B by PCR NEGATIVE NEGATIVE Final    Comment: (NOTE) The Xpert Xpress SARS-CoV-2/FLU/RSV assay is intended as an aid in  the diagnosis of influenza from Nasopharyngeal swab specimens and  should not be used as a sole basis for treatment. Nasal washings and  aspirates are unacceptable for Xpert Xpress SARS-CoV-2/FLU/RSV  testing.  Fact Sheet for Patients: https://www.moore.com/  Fact Sheet for Healthcare Providers: https://www.young.biz/  This test is not yet approved or cleared by the [place] FDA and  has been authorized for detection and/or diagnosis of SARS-CoV-2 by  FDA under an Emergency Use Authorization (EUA). This EUA will remain  in effect (meaning this test can be used) for the duration of the  Covid-19 declaration under Section 564(b)(1) of the Act, 21  U.S.C. section 360bbb-3(b)(1), unless the authorization is  terminated or revoked. Performed at [facility], [address], [place], [place] [address]   MRSA PCR Screening     Status: None   Collection Time: 06/04/20 10:59 PM   Specimen: Nasal Mucosa; Nasopharyngeal  Result Value Ref Range Status   MRSA by PCR NEGATIVE NEGATIVE Final    Comment:        The GeneXpert MRSA Assay (FDA approved for NASAL specimens only), is one component of a comprehensive MRSA colonization surveillance program. It is not intended to diagnose  MRSA infection nor to guide or monitor treatment for MRSA infections. Performed at [facility], [address] [place], [place] [address]      Radiology Studies: No results found.    Pamella Pert, MD, PhD [facility]  Between 7 am - 7 pm I am available, please contact me via Amion or Securechat  Between 7 pm - 7 am I am not available, please contact night coverage MD/APP via Amion

## 2020-06-05 NOTE — TOC Initial Note (Signed)
Transition of Care [facility]) - Initial/Assessment Note    Patient Details  Name: Vincent Peters MRN: [id number] Date of Birth: 1981-07-15  Transition of Care [facility]) CM/SW Contact:    Golda Acre, RN Phone Number: 06/05/2020, 7:28 AM  Clinical Narrative:                  39 y.o. male, with history of atrial fibrillation, dilated cardiomyopathy with an LVEF of 20 to 30%, hypertension, polysubstance abuse, and more who presents to the ED via EMS.  Chief complaint -someone called EMS because patient was unconscious in the middle of the street.  Patient does not speak [place], translator via iPad was attempted.  Translator repeated questions several times, we tried several different questions, patient was not answering questions.  ED provider reports that he was answering questions for her.  He reports that he typically drinks 10 beers per day.  He had 5 beers this morning before he went to work.  He has not drink anything since which is abnormal for him.  He got shaky and had nausea and vomiting at work.  He was denying hallucinations.  He did admit to a mild headache for her no visual change.  Again patient would answer any of these questions for me with the use of a translator.  Chart review reveals that patient was admitted in February 2021 for alcohol withdrawal as well.  At that time he had tremulousness, restlessness, narrow complex tachycardia as well as A. fib with RVR.  He transiently required a Cardizem drip.  Cardiology did see him during that hospital course, and he followed up with cardiology after the discharge as well.  He had echocardiogram at that time that showed systolic CHF with an EF of 20 to 30%.  Patient was started on Entresto during this hospitalization as well as metoprolol.  He was not started on a statin and that is likely because of his alcoholic hepatitis with transaminitis and hyperammonemia during that stay.  His withdrawal symptoms worsen during his  hospitalization and he required the Precedex drip for 3 days.  For this reason patient will be admitted to progressive care here in case he also requires Precedex during this hospitalization. Iv ativan, iv precedex, iv kcl runs x3. Plan is to return home will need substance abuse resources in spanish Following for progression. Expected Discharge Plan: Home/Self Care Barriers to Discharge: Barriers Unresolved (comment) (etoh withdrawal and iv meds)   Patient Goals and CMS Choice Patient states their goals for this hospitalization and ongoing recovery are:: pt only speaks spanish      Expected Discharge Plan and Services Expected Discharge Plan: Home/Self Care In-house Referral: Interpreting Services Discharge Planning Services: CM Consult   Living arrangements for the past 2 months: Single Family Home                                      Prior Living Arrangements/Services Living arrangements for the past 2 months: Single Family Home Lives with:: Self Patient language and need for interpreter reviewed:: Yes (speaks spanish)        Need for Family Participation in Patient Care: Yes (Comment) Care giver support system in place?: Yes (comment)   Criminal Activity/Legal Involvement Pertinent to Current Situation/Hospitalization: No - Comment as needed  Activities of Daily Living Home Assistive Devices/Equipment: None ADL Screening (condition at time of admission) Patient's cognitive ability adequate to safely  complete daily activities?: Yes Is the patient deaf or have difficulty hearing?: No Does the patient have difficulty seeing, even when wearing glasses/contacts?: No Does the patient have difficulty concentrating, remembering, or making decisions?: No Patient able to express need for assistance with ADLs?: Yes Does the patient have difficulty dressing or bathing?: No Independently performs ADLs?: Yes (appropriate for developmental age) Does the patient have difficulty  walking or climbing stairs?: No Weakness of Legs: None Weakness of Arms/Hands: None  Permission Sought/Granted                  Emotional Assessment Appearance:: Appears stated age Attitude/Demeanor/Rapport: Uncooperative Affect (typically observed): Agitated Orientation: : Fluctuating Orientation (Suspected and/or reported Sundowners) Alcohol / Substance Use: Alcohol Use, Illicit Drugs Psych Involvement: No (comment)  Admission diagnosis:  Delirium tremens (HCC) [F10.231] Alcohol withdrawal syndrome with complication [facility]) [F10.239] Patient Active Problem List   Diagnosis Date Noted  . Alcohol abuse   . Hypertensive crisis 09/28/2019  . Dilated cardiomyopathy (HCC) 09/28/2019  . Hypomagnesemia 09/28/2019  . Alcohol withdrawal delirium, acute, hyperactive (HCC) 09/23/2019  . Elevated LFTs   . Withdrawal symptoms, alcohol (HCC) 07/06/2019  . Alcohol abuse with alcohol-induced mood disorder (HCC) 02/26/2018  . Alcohol withdrawal (HCC) 02/10/2014  . Hypokalemia 02/10/2014  . Thrombocytopenia (HCC) 02/10/2014  . Hypercalcemia 02/10/2014  . Delirium tremens (HCC) 02/10/2014   PCP:  Patient, No Pcp Per Pharmacy:   [facility] - [place], [place] - [address] [place] [address] Phone: [phone] Fax: [phone]  [id number] - [place], [place] - [address] AT [facility] [address] [place] [address] Phone: [phone] Fax: [phone]  [facility] [place], [place] - [address] [place] [address] Phone: [phone] Fax: [phone]     Social Determinants of Health (SDOH) Interventions    Readmission Risk Interventions No flowsheet data found.

## 2020-06-06 DIAGNOSIS — E876 Hypokalemia: Secondary | ICD-10-CM

## 2020-06-06 LAB — COMPREHENSIVE METABOLIC PANEL
ALT: 144 U/L — ABNORMAL HIGH (ref 0–44)
AST: 307 U/L — ABNORMAL HIGH (ref 15–41)
Albumin: 3.1 g/dL — ABNORMAL LOW (ref 3.5–5.0)
Alkaline Phosphatase: 105 U/L (ref 38–126)
Anion gap: 10 (ref 5–15)
BUN: 8 mg/dL (ref 6–20)
CO2: 25 mmol/L (ref 22–32)
Calcium: 8.7 mg/dL — ABNORMAL LOW (ref 8.9–10.3)
Chloride: 100 mmol/L (ref 98–111)
Creatinine, Ser: 0.44 mg/dL — ABNORMAL LOW (ref 0.61–1.24)
GFR, Estimated: >60 mL/min (ref >60)
Glucose, Bld: 128 mg/dL — ABNORMAL HIGH (ref 70–99)
Potassium: 3.3 mmol/L — ABNORMAL LOW (ref 3.5–5.1)
Sodium: 135 mmol/L (ref 135–145)
Total Bilirubin: 0.9 mg/dL (ref 0.3–1.2)
Total Protein: 7.1 g/dL (ref 6.5–8.1)

## 2020-06-06 LAB — MAGNESIUM: Magnesium: 1.5 mg/dL — ABNORMAL LOW (ref 1.7–2.4)

## 2020-06-06 LAB — CBC
HCT: 39.7 % (ref 39.0–52.0)
Hemoglobin: 13.3 g/dL (ref 13.0–17.0)
MCH: 32.9 pg (ref 26.0–34.0)
MCHC: 33.5 g/dL (ref 30.0–36.0)
MCV: 98.3 fL (ref 80.0–100.0)
Platelets: 81 10*3/uL — ABNORMAL LOW (ref 150–400)
RBC: 4.04 MIL/uL — ABNORMAL LOW (ref 4.22–5.81)
RDW: 11.8 % (ref 11.5–15.5)
WBC: 8.6 10*3/uL (ref 4.0–10.5)
nRBC: 0 % (ref 0.0–0.2)

## 2020-06-06 LAB — PHOSPHORUS: Phosphorus: 3.7 mg/dL (ref 2.5–4.6)

## 2020-06-06 MED ORDER — POTASSIUM CHLORIDE 10 MEQ/100ML IV SOLN
10.0000 meq | INTRAVENOUS | Status: AC
  Administered 2020-06-06 (×3): 10 meq via INTRAVENOUS
  Filled 2020-06-06 (×3): qty 100

## 2020-06-06 MED ORDER — POTASSIUM CHLORIDE CRYS ER 20 MEQ PO TBCR
40.0000 meq | EXTENDED_RELEASE_TABLET | Freq: Two times a day (BID) | ORAL | Status: AC
  Administered 2020-06-06 (×2): 40 meq via ORAL
  Filled 2020-06-06: qty 2

## 2020-06-06 MED ORDER — ORAL CARE MOUTH RINSE
15.0000 mL | Freq: Two times a day (BID) | OROMUCOSAL | Status: DC
  Administered 2020-06-06 – 2020-06-12 (×12): 15 mL via OROMUCOSAL

## 2020-06-06 MED ORDER — MAGNESIUM SULFATE IN D5W 1-5 GM/100ML-% IV SOLN
1.0000 g | Freq: Once | INTRAVENOUS | Status: AC
  Administered 2020-06-06: 1 g via INTRAVENOUS
  Filled 2020-06-06: qty 100

## 2020-06-06 MED ORDER — HYDRALAZINE HCL 20 MG/ML IJ SOLN: 10.0000 mg | Freq: Four times a day (QID) | INTRAMUSCULAR | Status: DC | PRN

## 2020-06-06 MED ORDER — LABETALOL HCL 5 MG/ML IV SOLN
10.0000 mg | INTRAVENOUS | Status: AC | PRN
  Administered 2020-06-06 (×2): 10 mg via INTRAVENOUS
  Filled 2020-06-06 (×2): qty 4

## 2020-06-06 NOTE — Progress Notes (Signed)
PROGRESS NOTE    Vincent Peters  ZOX:[id number] DOB: March 11, 1981 DOA: 06/03/2020 PCP: Patient, No Pcp Per   Brief Narrative:  HPI per Dr. Melo Dike on 06/03/20 Vincent Peters  is a 39 y.o. male, with history of atrial fibrillation, dilated cardiomyopathy with an LVEF of 20 to 30%, hypertension, polysubstance abuse, and more who presents to the ED via EMS.  Chief complaint -someone called EMS because patient was unconscious in the middle of the street.  Patient does not speak [place], translator via iPad was attempted.  Translator repeated questions several times, we tried several different questions, patient was not answering questions.  ED provider reports that he was answering questions for her.  He reports that he typically drinks 10 beers per day.  He had 5 beers this morning before he went to work.  He has not drink anything since which is abnormal for him.  He got shaky and had nausea and vomiting at work.  He was denying hallucinations.  He did admit to a mild headache for her no visual change.  Again patient would answer any of these questions for me with the use of a translator.  Chart review reveals that patient was admitted in February 2021 for alcohol withdrawal as well.  At that time he had tremulousness, restlessness, narrow complex tachycardia as well as A. fib with RVR.  He transiently required a Cardizem drip.  Cardiology did see him during that hospital course, and he followed up with cardiology after the discharge as well.  He had echocardiogram at that time that showed systolic CHF with an EF of 20 to 30%.  Patient was started on Entresto during this hospitalization as well as metoprolol.  He was not started on a statin and that is likely because of his alcoholic hepatitis with transaminitis and hyperammonemia during that stay.  His withdrawal symptoms worsen during his hospitalization and he required the Precedex drip for 3 days.  For this reason  patient will be admitted to progressive care here in case he also requires Precedex during this hospitalization.  **Interim History Patient was still having some withdrawal symptoms but off the Precedex drip now.  Is slowly improving.  Will consult TOC for substance abuse resources  Assessment & Plan:   Active Problems:   Delirium tremens (HCC)  Alcohol withdrawal with delirium tremens Acute Metabolic Encephalopathy in the setting of withdrawal, improving  -Got worse the night before last and overnight transferred to the ICU.  Continued Precedex but now off -Continue CIWA Protocol  -C/w Folic Acid, Thiamine, and MVI+ Minerals  -UDS was negative on admission.  Has a history of cocaine use -CIWA ranging from 6-14 -Patient states that he quit drinking 3 days ago. -Continue lactulose 20 g p.o. daily -Continue to Monitor Closely  Rhabdomyolysis -He was found down for an unknown amount of time, CK around 1800, stable today, continue limited IV fluids 500 cc bolus given heart failure.  Recheck CK yesterday morning was 764 -CK Trending Down and will repeat in the AM  Chronic systolic CHF -With an EF of 20-30%, patient is noncompliant with his home medications, resume here and monitor. -BNP was 114.6 -He appears to be tolerating medications well, his blood pressure is good, continue his home medications with Metoprolol Succinate 100 mg p.o. daily as well as Entresto 49-51 1 tab p.o. twice daily -He again appears euvolemic this morning  Paroxysmal A. Fib -C/w Telemetry Monitoring  -Continue metoprolol, Eliquis.   Hyponatremia -Due to dehydration, received a  limited bolus yesterday and sodium has now normalized -Sodium is now 135  GERD -Continue with pantoprazole 40 mg p.o. daily  Normocytic Anemia -Likely due to liver disease, he is macrocytic/normocytic.   -Hgb/Hct went from 12.9/37.9 -> 12.4/37.4 -> 13.3/39.7 -Anemia panel fairly unremarkable as it showed an iron level of  73, U IBC 223, TIBC 296, saturation ratios of 25%, ferritin level 121, folate level 9.0, vitamin B12 576 -Continue to monitor for signs and symptoms of bleeding; currently no overt bleeding noted  -Repeat CBC in a.m.  Hypertensive Urgency -Improved -Patient last blood pressure 146/86 -Continue to monitor blood pressures per protocol  Thromobocytopenia -Patient's platelet count is slowly improving and went from 60,000 and is trended up to 81,000 -Continue to monitor for signs and symptoms of bleeding; currently no overt bleeding noted -Repeat CBC in a.m.  Hyperbilirbuinemia -Improved and likely was reactive -Patient's T Bili went from 1.6 -> 1.3 -> 0.9 -Continue to Monitor and Replete as Necessary -Repeat CMP in the AM   Abnormal LFTs -Slightly worsening in the setting of Underlying Liver Disease due to EtOH -AST went from 135 -> 144 -> 307 -ALT went from 55 -> 144 -Check RUQ U/S and an Acute Hepatitis Panel -Continue to Monitor and Trend Hepatic Fxn Panel -Repeat CMP in AM   Hypokalemia -Mild at 3.3 and in the setting of poor po Intake -Replete with IV KCl 30 mEQ and po KCl 40 mEQ BID x2 -Replete Mag First -Continue to Monitor and Replete as Necessary -Repeat CMP in the AM  Hypomagnesemia -Patient's Mag Level was 1.5 -Replete with IV Mag Sulfate 1 gram x2 -Continue to Monitor and Replete as Necessary  -Repeat Mag Level in the AM  DVT prophylaxis: Anticoagulated with apixaban Code Status: FULL CODE Family Communication: No family present at bedside  Disposition Plan: Pending further clinical improvement; will remain in the stepdown unit for now as he just got off a Precedex drip.  Will need to continue to monitor for active's withdrawal symptoms.  Status is: Inpatient  Remains inpatient appropriate because:Unsafe d/c plan, IV treatments appropriate due to intensity of illness or inability to take PO and Inpatient level of care appropriate due to severity of  illness   Dispo: The patient is from: Home              Anticipated d/c is to: Home              Anticipated d/c date is:1-2 days              Patient currently is not medically stable to d/c.  Consultants:   None   Procedures: None  Antimicrobials: Anti-infectives (From admission, onward)   None        Subjective: Seen and examined at bedside with assistance of the translator Jesus [phone].  He was feeling well and still having some shakes.  He is awake and denied any complaints or pain.  No nausea or vomiting.  Tolerating his diet well.  Nursing states that his CIWA scores have been ranging from 6-14.  We will need to make sure his CIWA scores are below 5 before safe discharge disposition and will need transition of care to further evaluate.  Patient feels like he is improving and no longer required Precedex drip.  No other concerns or concerns at this time.  Objective: Vitals:   06/06/20 0347 06/06/20 0400 06/06/20 0500 06/06/20 0600  BP:  (!) 154/98 (!) 145/84 (!) 178/102  Pulse:  Marland Kitchen)  101 71 77  Resp:  (!) 25 10 15   Temp: 98.9 F (37.2 C)     TempSrc: Oral     SpO2:  100% 100% 100%  Weight:   66.3 kg   Height:        Intake/Output Summary (Last 24 hours) at 06/06/2020 0733 Last data filed at 06/06/2020 [id number] Gross per 24 hour  Intake 1833.09 ml  Output 4325 ml  Net -2491.91 ml   Filed Weights   06/04/20 0900 06/06/20 0500  Weight: 67.8 kg 66.3 kg   Examination: Physical Exam:  Constitutional: WN/WD slightly disheveled Hispanic male currently in NAD and appears mildly anxious but does appear comfortable Eyes: Lids and conjunctivae normal, sclerae anicteric  ENMT: External Ears, Nose appear normal. Grossly normal hearing.  Neck: Appears normal, supple, no cervical masses, normal ROM, no appreciable thyromegaly; no JVD Respiratory: Diminished to auscultation bilaterally, no wheezing, rales, rhonchi or crackles. Normal respiratory effort and patient is not  tachypenic. No accessory muscle use.  Unlabored breathing Cardiovascular: RRR, no murmurs / rubs / gallops. S1 and S2 auscultated. No extremity edema.  Abdomen: Soft, non-tender, slightly distended. Bowel sounds positive.  GU: Deferred. Musculoskeletal: No clubbing / cyanosis of digits/nails. No joint deformity upper and lower extremities. Skin: No rashes, lesions, ulcers on limited skin evaluation. No induration; Warm and dry.  Neurologic: CN 2-12 grossly intact with no focal deficits. Romberg sign and cerebellar reflexes not assessed.  Psychiatric: Normal judgment and insight. Alert and oriented x 3.  Slightly anxious mood and appropriate affect.   Data Reviewed: I have personally reviewed following labs and imaging studies  CBC: Recent Labs  Lab 06/03/20 1957 06/04/20 0347 06/05/20 0256 06/06/20 0256  WBC 13.5* 9.4 6.2 8.6  NEUTROABS  --  6.6  --   --   HGB 13.1 12.9* 12.4* 13.3  HCT 39.8 37.9* 37.4* 39.7  MCV 100.8* 96.7 100.0 98.3  PLT 82* 60* 59* 81*   Basic Metabolic Panel: Recent Labs  Lab 06/03/20 1957 06/04/20 0347 06/05/20 0256 06/06/20 0256  NA 139 133* 137 135  K 4.0 3.0* 3.0* 3.3*  CL 97* 95* 102 100  CO2 17* 26 25 25   GLUCOSE 119* 104* 117* 128*  BUN 10 10 6 8   CREATININE 0.56* 0.48* 0.43* 0.44*  CALCIUM 9.0 8.1* 8.6* 8.7*  MG  --  1.4* 1.6* 1.5*  PHOS  --   --  3.2 3.7   GFR: Estimated Creatinine Clearance: 118.6 mL/min (A) (by C-G formula based on SCr of 0.44 mg/dL (L)). Liver Function Tests: Recent Labs  Lab 06/03/20 1957 06/04/20 0347 06/05/20 0256 06/06/20 0256  AST 163* 135* 144* 307*  ALT 64* 55* 55* 144*  ALKPHOS 106 88 79 105  BILITOT 2.2* 1.6* 1.3* 0.9  PROT 7.8 6.9 6.4* 7.1  ALBUMIN 3.3* 2.9* 2.7* 3.1*   No results for input(s): LIPASE, AMYLASE in the last 168 hours. Recent Labs  Lab 06/03/20 2106  AMMONIA 50*   Coagulation Profile: Recent Labs  Lab 06/04/20 0347  INR 1.2   Cardiac Enzymes: Recent Labs  Lab  06/03/20 1957 06/04/20 0347 06/05/20 0251  CKTOTAL 1,885* 1,844* 764*   BNP (last 3 results) No results for input(s): PROBNP in the last 8760 hours. HbA1C: No results for input(s): HGBA1C in the last 72 hours. CBG: No results for input(s): GLUCAP in the last 168 hours. Lipid Profile: No results for input(s): CHOL, HDL, LDLCALC, TRIG, CHOLHDL, LDLDIRECT in the last 72 hours. Thyroid Function Tests:  Recent Labs    06/04/20 0347  TSH 1.222   Anemia Panel: Recent Labs    06/05/20 0256  VITAMINB12 576  FOLATE 9.0  FERRITIN 121  TIBC 296  IRON 73  RETICCTPCT 1.7   Sepsis Labs: Recent Labs  Lab 06/03/20 2230 06/04/20 0100  LATICACIDVEN 1.1 1.8    Recent Results (from the past 240 hour(s))  Respiratory Panel by RT PCR (Flu A&B, Covid) -     Status: None   Collection Time: 06/03/20  9:45 PM  Result Value Ref Range Status   SARS Coronavirus 2 by RT PCR NEGATIVE NEGATIVE Final    Comment: (NOTE) SARS-CoV-2 target nucleic acids are NOT DETECTED.  The SARS-CoV-2 RNA is generally detectable in upper respiratoy specimens during the acute phase of infection. The lowest concentration of SARS-CoV-2 viral copies this assay can detect is 131 copies/mL. A negative result does not preclude SARS-Cov-2 infection and should not be used as the sole basis for treatment or other patient management decisions. A negative result may occur with  improper specimen collection/handling, submission of specimen other than nasopharyngeal swab, presence of viral mutation(s) within the areas targeted by this assay, and inadequate number of viral copies (<131 copies/mL). A negative result must be combined with clinical observations, patient history, and epidemiological information. The expected result is Negative.  Fact Sheet for Patients:  https://www.moore.com/  Fact Sheet for Healthcare Providers:  https://www.young.biz/  This test is no t yet  approved or cleared by the [place] FDA and  has been authorized for detection and/or diagnosis of SARS-CoV-2 by FDA under an Emergency Use Authorization (EUA). This EUA will remain  in effect (meaning this test can be used) for the duration of the COVID-19 declaration under Section 564(b)(1) of the Act, 21 U.S.C. section 360bbb-3(b)(1), unless the authorization is terminated or revoked sooner.     Influenza A by PCR NEGATIVE NEGATIVE Final   Influenza B by PCR NEGATIVE NEGATIVE Final    Comment: (NOTE) The Xpert Xpress SARS-CoV-2/FLU/RSV assay is intended as an aid in  the diagnosis of influenza from Nasopharyngeal swab specimens and  should not be used as a sole basis for treatment. Nasal washings and  aspirates are unacceptable for Xpert Xpress SARS-CoV-2/FLU/RSV  testing.  Fact Sheet for Patients: https://www.moore.com/  Fact Sheet for Healthcare Providers: https://www.young.biz/  This test is not yet approved or cleared by the [place] FDA and  has been authorized for detection and/or diagnosis of SARS-CoV-2 by  FDA under an Emergency Use Authorization (EUA). This EUA will remain  in effect (meaning this test can be used) for the duration of the  Covid-19 declaration under Section 564(b)(1) of the Act, 21  U.S.C. section 360bbb-3(b)(1), unless the authorization is  terminated or revoked. Performed at [facility], [address] [place], [place] [address]   MRSA PCR Screening     Status: None   Collection Time: 06/04/20 10:59 PM   Specimen: Nasal Mucosa; Nasopharyngeal  Result Value Ref Range Status   MRSA by PCR NEGATIVE NEGATIVE Final    Comment:        The GeneXpert MRSA Assay (FDA approved for NASAL specimens only), is one component of a comprehensive MRSA colonization surveillance program. It is not intended to diagnose MRSA infection nor to guide or monitor treatment for MRSA  infections. Performed at [facility], [address] [place], [place] [address]     RN Pressure Injury Documentation:     Estimated body  mass index is 21.58 kg/m as calculated from the following:   Height as of this encounter: 5\' 9"  (1.753 m).   Weight as of this encounter: 66.3 kg.  Malnutrition Type:      Malnutrition Characteristics:      Nutrition Interventions:    Radiology Studies: No results found.  Scheduled Meds: . apixaban  5 mg Oral BID  . Chlorhexidine Gluconate Cloth  6 each Topical Daily  . folic acid  1 mg Oral Daily  . lactulose  20 g Oral Daily  . LORazepam  0-4 mg Intravenous Q12H   Or  . LORazepam  0-4 mg Oral Q12H  . mouth rinse  15 mL Mouth Rinse BID  . metoprolol  100 mg Oral Daily  . multivitamin with minerals  1 tablet Oral Daily  . pantoprazole  40 mg Oral Daily  . potassium chloride  40 mEq Oral Once  . sacubitril-valsartan  1 tablet Oral BID  . thiamine  100 mg Oral Daily   Or  . thiamine  100 mg Intravenous Daily  . thiamine  100 mg Oral Daily   Continuous Infusions: . dexmedetomidine (PRECEDEX) IV infusion Stopped (06/05/20 1543)  . potassium chloride 40 mL/hr at 06/06/20 0643    LOS: 3 days   13/03/21, DO [facility] PAGER is on AMION  If 7PM-7AM, please contact night-coverage [email or web address]

## 2020-06-06 NOTE — TOC Progression Note (Signed)
Transition of Care [facility]) - Progression Note    Patient Details  Name: Vincent Peters MRN: [id number] Date of Birth: [birth year]  Transition of Care [facility]) CM/SW Contact  Golda Acre, RN Phone Number: 06/06/2020, 7:39 AM  Clinical Narrative:    Remains on iv precedex and iv ativan for confusion and etoh w/d, iv thiamine 100mg  qd, iv mgso4x1, iv k+ runs x3 for K+=3.3 Plan is to return to home with substance abuse resources once stable Following for progression.   Expected Discharge Plan: Home/Self Care Barriers to Discharge: Barriers Unresolved (comment) (etoh withdrawal and iv meds)  Expected Discharge Plan and Services Expected Discharge Plan: Home/Self Care In-house Referral: Interpreting Services Discharge Planning Services: CM Consult   Living arrangements for the past 2 months: Single Family Home                                       Social Determinants of Health (SDOH) Interventions    Readmission Risk Interventions No flowsheet data found.

## 2020-06-07 ENCOUNTER — Inpatient Hospital Stay (HOSPITAL_COMMUNITY): Payer: Self-pay

## 2020-06-07 DIAGNOSIS — F10231 Alcohol dependence with withdrawal delirium: Principal | ICD-10-CM

## 2020-06-07 DIAGNOSIS — R6510 Systemic inflammatory response syndrome (SIRS) of non-infectious origin without acute organ dysfunction: Secondary | ICD-10-CM

## 2020-06-07 LAB — CBC WITH DIFFERENTIAL/PLATELET
Abs Immature Granulocytes: 0.11 10*3/uL — ABNORMAL HIGH (ref 0.00–0.07)
Basophils Absolute: 0.1 10*3/uL (ref 0.0–0.1)
Basophils Relative: 1 %
Eosinophils Absolute: 0.3 10*3/uL (ref 0.0–0.5)
Eosinophils Relative: 2 %
HCT: 43.7 % (ref 39.0–52.0)
Hemoglobin: 14.4 g/dL (ref 13.0–17.0)
Immature Granulocytes: 1 %
Lymphocytes Relative: 12 %
Lymphs Abs: 1.8 10*3/uL (ref 0.7–4.0)
MCH: 33 pg (ref 26.0–34.0)
MCHC: 33 g/dL (ref 30.0–36.0)
MCV: 100 fL (ref 80.0–100.0)
Monocytes Absolute: 2.7 10*3/uL — ABNORMAL HIGH (ref 0.1–1.0)
Monocytes Relative: 18 %
Neutro Abs: 10.2 10*3/uL — ABNORMAL HIGH (ref 1.7–7.7)
Neutrophils Relative %: 66 %
Platelets: 121 10*3/uL — ABNORMAL LOW (ref 150–400)
RBC: 4.37 MIL/uL (ref 4.22–5.81)
RDW: 12 % (ref 11.5–15.5)
WBC: 15.2 10*3/uL — ABNORMAL HIGH (ref 4.0–10.5)
nRBC: 0 % (ref 0.0–0.2)

## 2020-06-07 LAB — COMPREHENSIVE METABOLIC PANEL
ALT: 149 U/L — ABNORMAL HIGH (ref 0–44)
AST: 203 U/L — ABNORMAL HIGH (ref 15–41)
Albumin: 3.3 g/dL — ABNORMAL LOW (ref 3.5–5.0)
Alkaline Phosphatase: 103 U/L (ref 38–126)
Anion gap: 10 (ref 5–15)
BUN: 7 mg/dL (ref 6–20)
CO2: 25 mmol/L (ref 22–32)
Calcium: 9.2 mg/dL (ref 8.9–10.3)
Chloride: 101 mmol/L (ref 98–111)
Creatinine, Ser: 0.64 mg/dL (ref 0.61–1.24)
GFR, Estimated: >60 mL/min (ref >60)
Glucose, Bld: 119 mg/dL — ABNORMAL HIGH (ref 70–99)
Potassium: 4.3 mmol/L (ref 3.5–5.1)
Sodium: 136 mmol/L (ref 135–145)
Total Bilirubin: 1.1 mg/dL (ref 0.3–1.2)
Total Protein: 7.9 g/dL (ref 6.5–8.1)

## 2020-06-07 LAB — URINALYSIS, ROUTINE W REFLEX MICROSCOPIC
Bilirubin Urine: NEGATIVE
Glucose, UA: NEGATIVE mg/dL
Ketones, ur: NEGATIVE mg/dL
Leukocytes,Ua: NEGATIVE
Nitrite: NEGATIVE
Protein, ur: 100 mg/dL — AB
Specific Gravity, Urine: 1.013 (ref 1.005–1.030)
pH: 7 (ref 5.0–8.0)

## 2020-06-07 LAB — PHOSPHORUS: Phosphorus: 3.2 mg/dL (ref 2.5–4.6)

## 2020-06-07 LAB — MAGNESIUM: Magnesium: 2 mg/dL (ref 1.7–2.4)

## 2020-06-07 MED ORDER — LORAZEPAM 1 MG PO TABS: 1.0000 mg | ORAL_TABLET | ORAL | Status: AC | PRN

## 2020-06-07 MED ORDER — PHENOBARBITAL SODIUM 130 MG/ML IJ SOLN
130.0000 mg | Freq: Three times a day (TID) | INTRAMUSCULAR | Status: AC
  Administered 2020-06-08 – 2020-06-10 (×6): 130 mg via INTRAVENOUS
  Filled 2020-06-07 (×6): qty 1

## 2020-06-07 MED ORDER — DEXMEDETOMIDINE HCL IN NACL 400 MCG/100ML IV SOLN
0.4000 ug/kg/h | INTRAVENOUS | Status: DC
  Administered 2020-06-07: 0.6 ug/kg/h via INTRAVENOUS
  Filled 2020-06-07: qty 100

## 2020-06-07 MED ORDER — PHENOBARBITAL SODIUM 130 MG/ML IJ SOLN
260.0000 mg | Freq: Three times a day (TID) | INTRAMUSCULAR | Status: AC
  Administered 2020-06-07 – 2020-06-08 (×3): 260 mg via INTRAVENOUS
  Filled 2020-06-07 (×3): qty 2

## 2020-06-07 MED ORDER — LORAZEPAM 2 MG/ML IJ SOLN
1.0000 mg | INTRAMUSCULAR | Status: AC | PRN
  Administered 2020-06-07: 2 mg via INTRAVENOUS
  Administered 2020-06-07: 3 mg via INTRAVENOUS
  Administered 2020-06-07: 2 mg via INTRAVENOUS
  Administered 2020-06-07: 1 mg via INTRAVENOUS
  Administered 2020-06-07: 2 mg via INTRAVENOUS
  Filled 2020-06-07 (×5): qty 1

## 2020-06-07 MED ORDER — DEXMEDETOMIDINE HCL IN NACL 400 MCG/100ML IV SOLN
0.4000 ug/kg/h | INTRAVENOUS | Status: DC
  Administered 2020-06-08: 0.7 ug/kg/h via INTRAVENOUS
  Filled 2020-06-07: qty 100

## 2020-06-07 MED ORDER — LORAZEPAM 2 MG/ML IJ SOLN
4.0000 mg | Freq: Once | INTRAMUSCULAR | Status: AC
  Administered 2020-06-07: 4 mg via INTRAVENOUS
  Filled 2020-06-07: qty 2

## 2020-06-07 MED ORDER — PHENOBARBITAL SODIUM 65 MG/ML IJ SOLN
65.0000 mg | Freq: Three times a day (TID) | INTRAMUSCULAR | Status: AC
  Administered 2020-06-10 – 2020-06-12 (×6): 65 mg via INTRAVENOUS
  Filled 2020-06-07 (×6): qty 1

## 2020-06-07 NOTE — Progress Notes (Signed)
PROGRESS NOTE    Vincent Peters  XBM:[id number] DOB: 1980-08-06 DOA: 06/03/2020 PCP: Patient, No Pcp Per   Brief Narrative:  HPI per Dr. Austen Dike on 06/03/20 Vincent Peters  is a 39 y.o. male, with history of atrial fibrillation, dilated cardiomyopathy with an LVEF of 20 to 30%, hypertension, polysubstance abuse, and more who presents to the ED via EMS.  Chief complaint -someone called EMS because patient was unconscious in the middle of the street.  Patient does not speak [place], translator via iPad was attempted.  Translator repeated questions several times, we tried several different questions, patient was not answering questions.  ED provider reports that he was answering questions for her.  He reports that he typically drinks 10 beers per day.  He had 5 beers this morning before he went to work.  He has not drink anything since which is abnormal for him.  He got shaky and had nausea and vomiting at work.  He was denying hallucinations.  He did admit to a mild headache for her no visual change.  Again patient would answer any of these questions for me with the use of a translator.  Chart review reveals that patient was admitted in February 2021 for alcohol withdrawal as well.  At that time he had tremulousness, restlessness, narrow complex tachycardia as well as A. fib with RVR.  He transiently required a Cardizem drip.  Cardiology did see him during that hospital course, and he followed up with cardiology after the discharge as well.  He had echocardiogram at that time that showed systolic CHF with an EF of 20 to 30%.  Patient was started on Entresto during this hospitalization as well as metoprolol.  He was not started on a statin and that is likely because of his alcoholic hepatitis with transaminitis and hyperammonemia during that stay.  His withdrawal symptoms worsen during his hospitalization and he required the Precedex drip for 3 days.  For this reason  patient will be admitted to progressive care here in case he also requires Precedex during this hospitalization.  **Interim History Patient was still having some withdrawal symptoms but off the Precedex drip now.  Is slowly improving started regressing so as needed Ativan was added back in addition to his scheduled Ativan every 12.  Will consult TOC for substance abuse resources.  Overnight patient spiked a temperature and his WBC worsened.  He has SIRS criteria with no identifiable source of infection currently.  Will hold off on starting antibiotics and patient has refused blood cultures.  Assessment & Plan:   Active Problems:   Delirium tremens (HCC)  Alcohol withdrawal with delirium tremens Acute Metabolic Encephalopathy in the setting of withdrawal and having hallucinations now again -Got worse the night before last and overnight transferred to the ICU.  Continued Precedex but now off -Continue CIWA Protocol and have added back as needed lorazepam 0-4 milligrams every as needed for this abdominal withdrawal protocol -C/w Folic Acid, Thiamine, and MVI+ Minerals  -UDS was negative on admission.  Has a history of cocaine use -CIWA elevated and now he is having hallucinations and tremors -Patient states that he quit drinking 3 days ago. -Continue lactulose 20 g p.o. daily -Continue to Monitor Closely and if he continues to decompensate will need Precedex drip again  Rhabdomyolysis -He was found down for an unknown amount of time, CK around 1800, stable today, continue limited IV fluids 500 cc bolus given heart failure.  Recheck CK yesterday morning was 764 -CK Trending  Down and will repeat in the AM  Chronic systolic CHF -With an EF of 20-30%, patient is noncompliant with his home medications, resume here and monitor. -BNP was 114.6 on admission -He appears to be tolerating medications well, his blood pressure is good, continue his home medications with Metoprolol Succinate 100 mg p.o.  daily as well as Entresto 49-51 1 tab p.o. twice daily -He again appears euvolemic this morning  Paroxysmal A. Fib -C/w Telemetry Monitoring  -Continue Metoprolol, Eliquis.   Hyponatremia -Due to dehydration, received a limited bolus yesterday and sodium has now normalized -Sodium is now 136  GERD -Continue with pantoprazole 40 mg p.o. daily  Normocytic Anemia -Likely due to liver disease, he is macrocytic/normocytic.   -Hgb/Hct went from 12.9/37.9 -> 12.4/37.4 -> 13.3/39.7 -> 14.4/43.7 -Anemia panel fairly unremarkable as it showed an iron level of 73, U IBC 223, TIBC 296, saturation ratios of 25%, ferritin level 121, folate level 9.0, vitamin B12 576 -Continue to monitor for signs and symptoms of bleeding; currently no overt bleeding noted  -Repeat CBC in a.m.  Hypertensive Urgency -Improved -Patient last blood pressure 157/100 -Continue to monitor blood pressures per protocol  Thromobocytopenia -Patient's platelet count is slowly improving and went from 60,000 and is trended up to 81,000 today and today it is 121,000 -Continue to monitor for signs and symptoms of bleeding; currently no overt bleeding noted -Repeat CBC in a.m.  Hyperbilirbuinemia -Improved and likely was reactive -Patient's T Bili went from 1.6 -> 1.3 -> 0.9 this morning it was 1.1 -Continue to Monitor and Replete as Necessary -Repeat CMP in the AM   Abnormal LFTs -Slightly worsening in the setting of Underlying Liver Disease due to EtOH -AST went from 135 -> 144 -> 307 -> 203 -ALT went from 55 -> 144 -> 149 -Check RUQ U/S and an Acute Hepatitis Panel if not improving or continues to worsen -Continue to Monitor and Trend Hepatic Fxn Panel -Repeat CMP in AM   Hypokalemia -Mild at 3.3 and is now improved to 4.3 -Magnesium level is now 2.0 -Continue to Monitor and Replete as Necessary -Repeat CMP in the AM  Hypomagnesemia -Patient's Mag Level was 1.5 and is now 2.0 -Continue to Monitor and  Replete as Necessary  -Repeat Mag Level in the AM  SIRS -No identifiable infection source noted currently -Patient was febrile at 100.6 this morning and now has a new leukocytosis to 15.2 -He was tachycardic and tachypneic but his tachycardia has improved but is still slightly tachypneic -Urinalysis is relatively unremarkable and showed small hemoglobin, negative ketones, negative nitrites, negative leukocytes, rare bacteria, 0-5 RBCs per high-power field, 0-5 WBCs -Urine culture still pending -Chest x-ray this morning showed "No acute process in the chest." -Wanted to obtain blood cultures but patient adamantly refused  DVT prophylaxis: Anticoagulated with apixaban Code Status: FULL CODE Family Communication: No family present at bedside  Disposition Plan: Pending further clinical improvement; will remain in the stepdown unit for now as he just got off a Precedex drip.  Will need to continue to monitor for active's withdrawal symptoms.  Status is: Inpatient  Remains inpatient appropriate because:Unsafe d/c plan, IV treatments appropriate due to intensity of illness or inability to take PO and Inpatient level of care appropriate due to severity of illness   Dispo: The patient is from: Home              Anticipated d/c is to: Home  Anticipated d/c date is:1-2 days              Patient currently is not medically stable to d/c.  Consultants:   None   Procedures: None  Antimicrobials: Anti-infectives (From admission, onward)   None        Subjective: Seen and examined at bedside with assistance of the nurse and he was very agitated this morning and not cooperative with trying to obtain blood cultures.  His very upset with his IV and did not want to be bothered.  Denies any shortness of breath, burning or discomfort in his urine or any abdominal pain.  I discussed with him about the importance of trying to figure out why he had a fever and no leukocytosis and I told  him that we needed blood cultures he said " why don't we just take them from you."  Nursing then reported later in the afternoon that he started having more hallucinations, anxiety and tremors.  Will need to continue monitor carefully.  No other concerns or complaints at this time.  Objective: Vitals:   06/07/20 1111 06/07/20 1200 06/07/20 1400 06/07/20 1445  BP: (!) 157/100     Pulse: 93 93 88 96  Resp: 15 (!) 28    Temp:  98.1 F (36.7 C)    TempSrc:  Oral    SpO2: 96% 97%    Weight:      Height:        Intake/Output Summary (Last 24 hours) at 06/07/2020 1456 Last data filed at 06/07/2020 1000 Gross per 24 hour  Intake --  Output 2400 ml  Net -2400 ml   Filed Weights   06/04/20 0900 06/06/20 0500  Weight: 67.8 kg 66.3 kg   Examination: Physical Exam:  Constitutional: WN/WD slightly disheveled Hispanic male currently agitated Eyes: Lids and conjunctivae normal, sclerae anicteric  ENMT: External Ears, Nose appear normal. Grossly normal hearing. Neck: Appears normal, supple, no cervical masses, normal ROM, no appreciable thyromegaly; no JVD Respiratory: Diminished to auscultation bilaterally, no wheezing, rales, rhonchi or crackles. Normal respiratory effort and patient is not tachypenic. No accessory muscle use.  Unlabored breathing Cardiovascular: RRR, no murmurs / rubs / gallops. S1 and S2 auscultated. No extremity edema. Abdomen: Soft, non-tender, non-distended. Bowel sounds positive.  GU: Deferred. Musculoskeletal: No clubbing / cyanosis of digits/nails. No joint deformity upper and lower extremities.  Skin: No rashes, lesions, ulcers on limited skin evaluation. No induration; Warm and dry.  Neurologic: CN 2-12 grossly intact with no focal deficits. Romberg sign and cerebellar reflexes not assessed.  Psychiatric: Impaired judgment and insight.  He is awake and alert.  Extremely agitated mood mood and appropriate affect.   Data Reviewed: I have personally reviewed  following labs and imaging studies  CBC: Recent Labs  Lab 06/03/20 1957 06/04/20 0347 06/05/20 0256 06/06/20 0256 06/07/20 0247  WBC 13.5* 9.4 6.2 8.6 15.2*  NEUTROABS  --  6.6  --   --  10.2*  HGB 13.1 12.9* 12.4* 13.3 14.4  HCT 39.8 37.9* 37.4* 39.7 43.7  MCV 100.8* 96.7 100.0 98.3 100.0  PLT 82* 60* 59* 81* 121*   Basic Metabolic Panel: Recent Labs  Lab 06/03/20 1957 06/04/20 0347 06/05/20 0256 06/06/20 0256 06/07/20 0247  NA 139 133* 137 135 136  K 4.0 3.0* 3.0* 3.3* 4.3  CL 97* 95* 102 100 101  CO2 17* 26 25 25 25   GLUCOSE 119* 104* 117* 128* 119*  BUN 10 10 6 8 7   CREATININE  0.56* 0.48* 0.43* 0.44* 0.64  CALCIUM 9.0 8.1* 8.6* 8.7* 9.2  MG  --  1.4* 1.6* 1.5* 2.0  PHOS  --   --  3.2 3.7 3.2   GFR: Estimated Creatinine Clearance: 118.6 mL/min (by C-G formula based on SCr of 0.64 mg/dL). Liver Function Tests: Recent Labs  Lab 06/03/20 1957 06/04/20 0347 06/05/20 0256 06/06/20 0256 06/07/20 0247  AST 163* 135* 144* 307* 203*  ALT 64* 55* 55* 144* 149*  ALKPHOS 106 88 79 105 103  BILITOT 2.2* 1.6* 1.3* 0.9 1.1  PROT 7.8 6.9 6.4* 7.1 7.9  ALBUMIN 3.3* 2.9* 2.7* 3.1* 3.3*   No results for input(s): LIPASE, AMYLASE in the last 168 hours. Recent Labs  Lab 06/03/20 2106  AMMONIA 50*   Coagulation Profile: Recent Labs  Lab 06/04/20 0347  INR 1.2   Cardiac Enzymes: Recent Labs  Lab 06/03/20 1957 06/04/20 0347 06/05/20 0251  CKTOTAL 1,885* 1,844* 764*   BNP (last 3 results) No results for input(s): PROBNP in the last 8760 hours. HbA1C: No results for input(s): HGBA1C in the last 72 hours. CBG: No results for input(s): GLUCAP in the last 168 hours. Lipid Profile: No results for input(s): CHOL, HDL, LDLCALC, TRIG, CHOLHDL, LDLDIRECT in the last 72 hours. Thyroid Function Tests: No results for input(s): TSH, T4TOTAL, FREET4, T3FREE, THYROIDAB in the last 72 hours. Anemia Panel: Recent Labs    06/05/20 0256  VITAMINB12 576  FOLATE 9.0   FERRITIN 121  TIBC 296  IRON 73  RETICCTPCT 1.7   Sepsis Labs: Recent Labs  Lab 06/03/20 2230 06/04/20 0100  LATICACIDVEN 1.1 1.8    Recent Results (from the past 240 hour(s))  Respiratory Panel by RT PCR (Flu A&B, Covid) -     Status: None   Collection Time: 06/03/20  9:45 PM  Result Value Ref Range Status   SARS Coronavirus 2 by RT PCR NEGATIVE NEGATIVE Final    Comment: (NOTE) SARS-CoV-2 target nucleic acids are NOT DETECTED.  The SARS-CoV-2 RNA is generally detectable in upper respiratoy specimens during the acute phase of infection. The lowest concentration of SARS-CoV-2 viral copies this assay can detect is 131 copies/mL. A negative result does not preclude SARS-Cov-2 infection and should not be used as the sole basis for treatment or other patient management decisions. A negative result may occur with  improper specimen collection/handling, submission of specimen other than nasopharyngeal swab, presence of viral mutation(s) within the areas targeted by this assay, and inadequate number of viral copies (<131 copies/mL). A negative result must be combined with clinical observations, patient history, and epidemiological information. The expected result is Negative.  Fact Sheet for Patients:  https://www.moore.com/  Fact Sheet for Healthcare Providers:  https://www.young.biz/  This test is no t yet approved or cleared by the [place] FDA and  has been authorized for detection and/or diagnosis of SARS-CoV-2 by FDA under an Emergency Use Authorization (EUA). This EUA will remain  in effect (meaning this test can be used) for the duration of the COVID-19 declaration under Section 564(b)(1) of the Act, 21 U.S.C. section 360bbb-3(b)(1), unless the authorization is terminated or revoked sooner.     Influenza A by PCR NEGATIVE NEGATIVE Final   Influenza B by PCR NEGATIVE NEGATIVE Final    Comment: (NOTE) The Xpert Xpress  SARS-CoV-2/FLU/RSV assay is intended as an aid in  the diagnosis of influenza from Nasopharyngeal swab specimens and  should not be used as a sole basis for treatment. Nasal washings and  aspirates are unacceptable for Xpert Xpress SARS-CoV-2/FLU/RSV  testing.  Fact Sheet for Patients: https://www.moore.com/  Fact Sheet for Healthcare Providers: https://www.young.biz/  This test is not yet approved or cleared by the [place] FDA and  has been authorized for detection and/or diagnosis of SARS-CoV-2 by  FDA under an Emergency Use Authorization (EUA). This EUA will remain  in effect (meaning this test can be used) for the duration of the  Covid-19 declaration under Section 564(b)(1) of the Act, 21  U.S.C. section 360bbb-3(b)(1), unless the authorization is  terminated or revoked. Performed at [facility], [address], [place], [place] [address]   MRSA PCR Screening     Status: None   Collection Time: 06/04/20 10:59 PM   Specimen: Nasal Mucosa; Nasopharyngeal  Result Value Ref Range Status   MRSA by PCR NEGATIVE NEGATIVE Final    Comment:        The GeneXpert MRSA Assay (FDA approved for NASAL specimens only), is one component of a comprehensive MRSA colonization surveillance program. It is not intended to diagnose MRSA infection nor to guide or monitor treatment for MRSA infections. Performed at [facility], [address], [place], [place] [address]     RN Pressure Injury Documentation:     Estimated body mass index is 21.58 kg/m as calculated from the following:   Height as of this encounter: 5\' 9"  (1.753 m).   Weight as of this encounter: 66.3 kg.  Malnutrition Type:      Malnutrition Characteristics:      Nutrition Interventions:    Radiology Studies: DG CHEST PORT 1 VIEW  Result Date: 06/07/2020 CLINICAL DATA:  Fever EXAM: PORTABLE CHEST 1 VIEW COMPARISON:   09/23/2019 FINDINGS: Low lung volumes. No consolidation or edema. No pleural effusion or pneumothorax. Cardiomediastinal contours are within normal limits. IMPRESSION: No acute process in the chest. Electronically Signed   By: 09/25/2019 M.D.   On: 06/07/2020 08:10    Scheduled Meds: . apixaban  5 mg Oral BID  . Chlorhexidine Gluconate Cloth  6 each Topical Daily  . folic acid  1 mg Oral Daily  . lactulose  20 g Oral Daily  . LORazepam  0-4 mg Intravenous Q12H   Or  . LORazepam  0-4 mg Oral Q12H  . mouth rinse  15 mL Mouth Rinse BID  . metoprolol  100 mg Oral Daily  . multivitamin with minerals  1 tablet Oral Daily  . pantoprazole  40 mg Oral Daily  . potassium chloride  40 mEq Oral Once  . sacubitril-valsartan  1 tablet Oral BID  . thiamine  100 mg Oral Daily   Continuous Infusions:   LOS: 4 days   13/11/2019, DO [facility] PAGER is on AMION  If 7PM-7AM, please contact night-coverage [email or web address]

## 2020-06-07 NOTE — Progress Notes (Signed)
Called by bedside RN with concerns for pt and staff safety d/t severe DT's from polysubstance withdrawal. He has become increasingly combative and difficult to manage. He has required approximately 8mg  of Ativan over the last 2-3 hours. I have reviewed Dr. note and believe it is necessary to restart pt on precedex at this time. PCCM consulted for management since according the pharmacy, precedex should be managed by critical care after initiation of drug.    Elizabeth Palau, NP [facility] [phone]-7a [phone]

## 2020-06-07 NOTE — Progress Notes (Signed)
06/07/2020  Called regarding worsening w/d symptoms. Case discussed with RN.  Patient now in florid DTs. Given 6mg  ativan this evening so far.  Trying to get out of bed. Give another 4mg  ativan. Patient seen with this and looks okay for now sleeping in bed. 4 point restraints and sitter in place. Precedex drip already in by eLink MD. Start phenobarb taper if keep having to give ativan PRNs (see orders) RN to contact me if I need to come over and evaluate this evening, otherwise day [facility] intensivist will see.   MD PCCM

## 2020-06-07 NOTE — Consult Note (Signed)
NAMEArkeem Peters, MRN:  [id number], DOB:  21-Mar-1981, LOS: 4 ADMISSION DATE:  06/03/2020, CONSULTATION DATE:  06/07/20 REFERRING MD:  Marland Mcalpine, CHIEF COMPLAINT:  confusion   Brief History   39 year old man with hx of dilated cardiomyopathy, heavy EtOH abuse presenting after being found unconscious. Hospital course complicated by worsening DTs prompting ICU eval.  History of present illness   39 year old man with dilated cardiomyopathy, afib, question EtOH related, heavy EtOH abuse brought in to hospital because found passed out on street on 10/31.  Found to have rhabdomyolysis and EtOH withdrawal.  Unfortunately the latter worsened this evening with multiple PRN ativan being given and started on precedex gtt.  PCCM consulted due to need for precedex drip.  Past Medical History  Dilated cardiomyopathy Anemia Afib on AC PTA Noncompliance EtOH abuse  Significant Hospital Events   10/31 admitted  Consults:  n/a  Procedures:  n/a  Significant Diagnostic Tests:  CXR neg  Micro Data:  covid neg ua neg  Antimicrobials:  none   Interim history/subjective:  Consulted, patient snoring so left him alone.  Objective   Blood pressure (!) 142/94, pulse 88, temperature 99.2 F (37.3 C), temperature source Axillary, resp. rate 17, height 5\' 9"  (1.753 m), weight 66.3 kg, SpO2 96 %.        Intake/Output Summary (Last 24 hours) at 06/07/2020 2327 Last data filed at 06/07/2020 1732 Gross per 24 hour  Intake 480 ml  Output 1475 ml  Net -995 ml   Filed Weights   06/04/20 0900 06/06/20 0500  Weight: 67.8 kg 66.3 kg    Examination: Constitutional: snoring man in no acute distress Eyes: deferred Ears, nose, mouth, and throat: mucous membranes moist, trachea midline Cardiovascular: heart sounds are regular, ext are warm to touch. no edema Respiratory: clear bilaterally, no accessory muscle use Gastrointestinal: abdomen is soft with + BS Skin: No rashes, normal  turgor Neurologic: snoring Psychiatric: deferred  Resolved Hospital Problem list   n/a  Assessment & Plan:  DTs- controlled with ativan + precedex - Start phenobarb taper, wean off precedex gtt - Violent restraints for now - Thiamine/folate - Substance abuse counseling once more able to participate in care  Question of HE- on lactulose  Leukocytosis- new, unclear etiology, UA neg, would just watch  Mild acute on chronic transaminitis  Mild rhabdo improved with IVF  Afib- continue bb, eliquis  Hx of cardiomyopathy, does not appear volume overloaded- continue GDT meds as ordered  Best practice:  Diet: npo Pain/Anxiety/Delirium protocol (if indicated): see above VAP protocol (if indicated): n/a DVT prophylaxis: eliquis GI prophylaxis: n/a Glucose control: per primary Mobility: bed rest Code Status: full Family Communication: per primary Disposition: ICU pending precedex liberation  Labs   CBC: Recent Labs  Lab 06/03/20 1957 06/04/20 0347 06/05/20 0256 06/06/20 0256 06/07/20 0247  WBC 13.5* 9.4 6.2 8.6 15.2*  NEUTROABS  --  6.6  --   --  10.2*  HGB 13.1 12.9* 12.4* 13.3 14.4  HCT 39.8 37.9* 37.4* 39.7 43.7  MCV 100.8* 96.7 100.0 98.3 100.0  PLT 82* 60* 59* 81* 121*    Basic Metabolic Panel: Recent Labs  Lab 06/03/20 1957 06/04/20 0347 06/05/20 0256 06/06/20 0256 06/07/20 0247  NA 139 133* 137 135 136  K 4.0 3.0* 3.0* 3.3* 4.3  CL 97* 95* 102 100 101  CO2 17* 26 25 25 25   GLUCOSE 119* 104* 117* 128* 119*  BUN 10 10 6 8 7   CREATININE  0.56* 0.48* 0.43* 0.44* 0.64  CALCIUM 9.0 8.1* 8.6* 8.7* 9.2  MG  --  1.4* 1.6* 1.5* 2.0  PHOS  --   --  3.2 3.7 3.2   GFR: Estimated Creatinine Clearance: 118.6 mL/min (by C-G formula based on SCr of 0.64 mg/dL). Recent Labs  Lab 06/03/20 1957 06/03/20 2230 06/04/20 0100 06/04/20 0347 06/05/20 0256 06/06/20 0256 06/07/20 0247  WBC   < >  --   --  9.4 6.2 8.6 15.2*  LATICACIDVEN  --  1.1 1.8  --   --   --    --    < > = values in this interval not displayed.    Liver Function Tests: Recent Labs  Lab 06/03/20 1957 06/04/20 0347 06/05/20 0256 06/06/20 0256 06/07/20 0247  AST 163* 135* 144* 307* 203*  ALT 64* 55* 55* 144* 149*  ALKPHOS 106 88 79 105 103  BILITOT 2.2* 1.6* 1.3* 0.9 1.1  PROT 7.8 6.9 6.4* 7.1 7.9  ALBUMIN 3.3* 2.9* 2.7* 3.1* 3.3*   No results for input(s): LIPASE, AMYLASE in the last 168 hours. Recent Labs  Lab 06/03/20 2106  AMMONIA 50*    ABG    Component Value Date/Time   HCO3 26.2 06/03/2020 2240   O2SAT 81.3 06/03/2020 2240     Coagulation Profile: Recent Labs  Lab 06/04/20 0347  INR 1.2    Cardiac Enzymes: Recent Labs  Lab 06/03/20 1957 06/04/20 0347 06/05/20 0251  CKTOTAL 1,885* 1,844* 764*    HbA1C: Hgb A1c MFr Bld  Date/Time Value Ref Range Status  04/28/2019 02:01 AM 5.4 4.8 - 5.6 % Final    Comment:    (NOTE)         Prediabetes: 5.7 - 6.4         Diabetes: >6.4         Glycemic control for adults with diabetes: <7.0     CBG: No results for input(s): GLUCAP in the last 168 hours.  Review of Systems:   Deferred, sleeping but per nursing agitated and unable to answer questions  Past Medical History  He,  has a past medical history of Alcohol abuse, Cocaine abuse (HCC), and Hypertension.   Surgical History    Past Surgical History:  Procedure Laterality Date  . NO PAST SURGERIES       Social History   reports that he has been smoking cigarettes. He has never used smokeless tobacco. He reports current alcohol use of about 4.0 standard drinks of alcohol per week. He reports current drug use. Drug: Cocaine.   Family History   His family history is not on file.   Allergies No Known Allergies   Home Medications  Prior to Admission medications   Medication Sig Start Date End Date Taking? Authorizing Provider  apixaban (ELIQUIS) 5 MG TABS tablet Take 1 tablet (5 mg total) by mouth 2 (two) times daily. Patient not  taking: Reported on 06/04/2020 09/29/19   Alessandra Bevels, MD  lactulose (CHRONULAC) 10 GM/15ML solution Take 30 mLs (20 g total) by mouth daily. Patient not taking: Reported on 06/04/2020 09/29/19   Alessandra Bevels, MD  metoprolol succinate (TOPROL-XL) 200 MG 24 hr tablet Take 1 tablet (200 mg total) by mouth daily. Take with or immediately following a meal. Patient not taking: Reported on 06/04/2020 09/30/19   Alessandra Bevels, MD  Multiple Vitamin (MULTIVITAMIN WITH MINERALS) TABS tablet Take 1 tablet by mouth daily. 09/29/19   Alessandra Bevels, MD  ondansetron (ZOFRAN) 4 MG tablet  Take 1 tablet (4 mg total) by mouth every 8 (eight) hours as needed for nausea or vomiting. Patient not taking: Reported on 06/04/2020 09/29/19   Alessandra Bevels, MD  pantoprazole (PROTONIX) 40 MG tablet Take 1 tablet (40 mg total) by mouth daily. Patient not taking: Reported on 06/04/2020 09/29/19   Alessandra Bevels, MD  sacubitril-valsartan (ENTRESTO) 49-51 MG Take 1 tablet by mouth 2 (two) times daily. Patient not taking: Reported on 06/04/2020 09/29/19   Alessandra Bevels, MD

## 2020-06-08 ENCOUNTER — Inpatient Hospital Stay (HOSPITAL_COMMUNITY): Payer: Self-pay

## 2020-06-08 DIAGNOSIS — F10239 Alcohol dependence with withdrawal, unspecified: Secondary | ICD-10-CM

## 2020-06-08 LAB — COMPREHENSIVE METABOLIC PANEL
ALT: 159 U/L — ABNORMAL HIGH (ref 0–44)
AST: 212 U/L — ABNORMAL HIGH (ref 15–41)
Albumin: 3.2 g/dL — ABNORMAL LOW (ref 3.5–5.0)
Alkaline Phosphatase: 89 U/L (ref 38–126)
Anion gap: 12 (ref 5–15)
BUN: 8 mg/dL (ref 6–20)
CO2: 24 mmol/L (ref 22–32)
Calcium: 8.9 mg/dL (ref 8.9–10.3)
Chloride: 102 mmol/L (ref 98–111)
Creatinine, Ser: 0.43 mg/dL — ABNORMAL LOW (ref 0.61–1.24)
GFR, Estimated: >60 mL/min (ref >60)
Glucose, Bld: 131 mg/dL — ABNORMAL HIGH (ref 70–99)
Potassium: 3.4 mmol/L — ABNORMAL LOW (ref 3.5–5.1)
Sodium: 138 mmol/L (ref 135–145)
Total Bilirubin: 1 mg/dL (ref 0.3–1.2)
Total Protein: 7.2 g/dL (ref 6.5–8.1)

## 2020-06-08 LAB — CBC WITH DIFFERENTIAL/PLATELET
Abs Immature Granulocytes: 0.08 10*3/uL — ABNORMAL HIGH (ref 0.00–0.07)
Basophils Absolute: 0.1 10*3/uL (ref 0.0–0.1)
Basophils Relative: 1 %
Eosinophils Absolute: 0.3 10*3/uL (ref 0.0–0.5)
Eosinophils Relative: 4 %
HCT: 41.5 % (ref 39.0–52.0)
Hemoglobin: 13.8 g/dL (ref 13.0–17.0)
Immature Granulocytes: 1 %
Lymphocytes Relative: 20 %
Lymphs Abs: 1.7 10*3/uL (ref 0.7–4.0)
MCH: 33.1 pg (ref 26.0–34.0)
MCHC: 33.3 g/dL (ref 30.0–36.0)
MCV: 99.5 fL (ref 80.0–100.0)
Monocytes Absolute: 1.8 10*3/uL — ABNORMAL HIGH (ref 0.1–1.0)
Monocytes Relative: 21 %
Neutro Abs: 4.3 10*3/uL (ref 1.7–7.7)
Neutrophils Relative %: 53 %
Platelets: 125 10*3/uL — ABNORMAL LOW (ref 150–400)
RBC: 4.17 MIL/uL — ABNORMAL LOW (ref 4.22–5.81)
RDW: 12.3 % (ref 11.5–15.5)
WBC: 8.2 10*3/uL (ref 4.0–10.5)
nRBC: 0 % (ref 0.0–0.2)

## 2020-06-08 LAB — CK: Total CK: 203 U/L (ref 49–397)

## 2020-06-08 LAB — URINE CULTURE: Culture: <10000 — AB

## 2020-06-08 LAB — MAGNESIUM: Magnesium: 1.9 mg/dL (ref 1.7–2.4)

## 2020-06-08 LAB — PHOSPHORUS: Phosphorus: 4.6 mg/dL (ref 2.5–4.6)

## 2020-06-08 MED ORDER — POTASSIUM CHLORIDE CRYS ER 20 MEQ PO TBCR
40.0000 meq | EXTENDED_RELEASE_TABLET | Freq: Once | ORAL | Status: AC
  Administered 2020-06-08: 40 meq via ORAL
  Filled 2020-06-08: qty 2

## 2020-06-08 MED ORDER — POTASSIUM CHLORIDE 10 MEQ/100ML IV SOLN
10.0000 meq | INTRAVENOUS | Status: AC
  Administered 2020-06-08 (×4): 10 meq via INTRAVENOUS
  Filled 2020-06-08 (×4): qty 100

## 2020-06-08 NOTE — Progress Notes (Signed)
PROGRESS NOTE    [name]  KXF:[id number] DOB: [birth year] DOA: 06/03/2020 PCP: Patient, No Pcp Per   Brief Narrative:  HPI per Dr. Dathan Dike on 06/03/20 [place] Peters  is a 39 y.o. male, with history of atrial fibrillation, dilated cardiomyopathy with an LVEF of 20 to 30%, hypertension, polysubstance abuse, and more who presents to the ED via EMS.  Chief complaint -someone called EMS because patient was unconscious in the middle of the street.  Patient does not speak [place], translator via iPad was attempted.  Translator repeated questions several times, we tried several different questions, patient was not answering questions.  ED provider reports that he was answering questions for her.  He reports that he typically drinks 10 beers per day.  He had 5 beers this morning before he went to work.  He has not drink anything since which is abnormal for him.  He got shaky and had nausea and vomiting at work.  He was denying hallucinations.  He did admit to a mild headache for her no visual change.  Again patient would answer any of these questions for me with the use of a translator.  Chart review reveals that patient was admitted in February 2021 for alcohol withdrawal as well.  At that time he had tremulousness, restlessness, narrow complex tachycardia as well as A. fib with RVR.  He transiently required a Cardizem drip.  Cardiology did see him during that hospital course, and he followed up with cardiology after the discharge as well.  He had echocardiogram at that time that showed systolic CHF with an EF of 20 to 30%.  Patient was started on Entresto during this hospitalization as well as metoprolol.  He was not started on a statin and that is likely because of his alcoholic hepatitis with transaminitis and hyperammonemia during that stay.  His withdrawal symptoms worsen during his hospitalization and he required the Precedex drip for 3 days.  For this reason  patient will be admitted to progressive care here in case he also requires Precedex during this hospitalization.  **Interim History Patient was still having some withdrawal symptoms but off the Precedex drip now.  Is slowly improving started regressing so as needed Ativan was added back in addition to his scheduled Ativan every 12.  Will consult TOC for substance abuse resources.  Overnight patient spiked a temperature and his WBC worsened.  He has SIRS criteria with no identifiable source of infection currently.  Will hold off on starting antibiotics and patient has refused blood cultures.  06/08/2020: Overnight patient significantly decompensated and had to be placed back on a Precedex drip.  Critical care was consulted for further evaluation recommendations and they started a phenobarbital taper and attempt to wean off the Precedex drip.  He was also placed in violent restraints as he was found trying to stand up and jump out of the bed.  He also had an in and out catheterization due to acute urinary retention.  This morning he is much more awake and calmer his blood pressure is lower due to the Precedex.  He is main complaint now is that he has weakness in his right arm and hand and diminished sensation so will obtain a head CT scan to rule out any hemorrhagic CVA given that he is on Eliquis.  We will continue to monitor and patient IV is in the arm so we will need to reassess his IV sites given that the IVs in his right upper arm.  Will  order a head CT scan. Appreciate critical care evaluation.  Assessment & Plan:   Active Problems:   Delirium tremens (HCC)   SIRS (systemic inflammatory response syndrome) (HCC)  Alcohol withdrawal with Delirium Tremens Acute Metabolic Encephalopathy in the setting of withdrawal and having hallucinations now again -Patient was back on Precedex gtt given significant decompensation overnight; Currently Precedex is being held due to Hypotension -Continue CIWA  Protocol and have added back as needed lorazepam 0-4 milligrams every as needed for this abdominal withdrawal protocol -C/w Folic Acid, Thiamine, and MVI+ Minerals  -UDS was negative on admission.  Has a history of cocaine use -CIWA elevated and now he is having hallucinations and tremors -Patient states that he quit drinking 3 days ago. -Continue lactulose 20 g p.o. daily -Critical care consulted for further evaluation and they recommend a phenobarbital taper; PRecedex was held due to Hypotension   Rhabdomyolysis -He was found down for an unknown amount of time, CK around 1800, stable today, continue limited IV fluids 500 cc bolus given heart failure.  Recheck CK  Is now 203 and normalized  -CK Trending Down and will not repeat in the AM  Chronic systolic CHF -With an EF of 20-30%, patient is noncompliant with his home medications, resume here and monitor. -BNP was 114.6 on admission -He appears to be tolerating medications well, his blood pressure is good, continue his home medications with Metoprolol Succinate 100 mg p.o. daily as well as Entresto 49-51 1 tab p.o. twice daily -He again appears euvolemic this morning; He is +437 mL since admission  Paroxysmal A. Fib -C/w Telemetry Monitoring  -Continue Metoprolol, Eliquis.   Hyponatremia -Due to dehydration, received a limited bolus yesterday and sodium has now normalized -Sodium is now 138  GERD -Continue with pantoprazole 40 mg p.o. daily  Normocytic Anemia -Likely due to liver disease, he is macrocytic/normocytic.   -Hgb/Hct went from 12.9/37.9 -> 12.4/37.4 -> 13.3/39.7 -> 14.4/43.7 -> 13.8/41.5  -Anemia panel fairly unremarkable as it showed an iron level of 73, U IBC 223, TIBC 296, saturation ratios of 25%, ferritin level 121, folate level 9.0, vitamin B12 576 -Continue to monitor for signs and symptoms of bleeding; currently no overt bleeding noted  -Repeat CBC in a.m.  Hypertensive Urgency --> Drug Induced  Hypotension -Improved and now Hypotensive in the setting of Precedex gtt -Patient last blood pressure 95/54 but was as low as 70/40 this AM -Continue to monitor blood pressures per protocol and Precedex has been held -Hold metoprolol tartrate as well as Entresto given his SBP less than 100  Thromobocytopenia -Patient's platelet count is slowly improving and went from 60,000 and is trended up to 81,000 today and today it is 125,000 -Continue to monitor for signs and symptoms of bleeding; currently no overt bleeding noted -Repeat CBC in a.m.  Hyperbilirbuinemia -Improved and likely was reactive -Patient's T Bili went from 1.6 -> 1.3 -> 0.9 this morning it was 1.0 -Continue to Monitor and Replete as Necessary -Repeat CMP in the AM   Abnormal LFTs, relatively stable  -Slightly worsening in the setting of Underlying Liver Disease due to EtOH -AST went from 135 -> 144 -> 307 -> 203 -> 212 -ALT went from 55 -> 144 -> 149 -> 159 -Check RUQ U/S and an Acute Hepatitis Panel if not improving or continues to worsen -Continue to Monitor and Trend Hepatic Fxn Panel -Repeat CMP in AM   Hypokalemia -Mild at 3.3 and is now improved to 4.3 yesterday and today is  back down at 3.4 -Magnesium level is now 1.9 -Replete with IV KCl 40 mg as well as p.o. KCl -Continue to Monitor and Replete as Necessary -Repeat CMP in the AM  Hypomagnesemia -Patient's Mag Level was 1.5 and is now 1.9 -Continue to Monitor and Replete as Necessary  -Repeat Mag Level in the AM  SIRS -No identifiable infection source noted currently and his temperatures resolved along with his leukocytosis -Patient was febrile at 100.6 yesterday morning and now has a new leukocytosis to 15.2 but this is now improved and has fever has resolved and his WBC is now 8.2 -He was tachycardic and tachypneic but his tachycardia has improved but is still slightly tachypneic -Urinalysis is relatively unremarkable and showed small hemoglobin,  negative ketones, negative nitrites, negative leukocytes, rare bacteria, 0-5 RBCs per high-power field, 0-5 WBCs -Urine culture still pending -Chest x-ray yesterday morning showed "No acute process in the chest." -Wanted to obtain blood cultures but patient adamantly refused  Right arm weakness and diminished strength -Obtain a head CT scan without contrast given that he has been on anticoagulation  -He has an IV in the right upper arm and states that his arm also hurts but he has diminished sensation and grip strength. -Blood pressure was also low at this time and will need to continue monitor and if continues to persist will consider an MRI and discussion with neurology as he has no other focal deficits noted no facial droop or slurred speech  Acute Urinary Retention -He had a bladder scan overnight with 496 mL which was residual  -He had an INO cath ordered as needed  DVT prophylaxis: Anticoagulated with apixaban Code Status: FULL CODE Family Communication: No family present at bedside  Disposition Plan: Pending further clinical improvement and he decompensated overnight and to be placed back on a Precedex drip.  Status is: Inpatient  Remains inpatient appropriate because:Unsafe d/c plan, IV treatments appropriate due to intensity of illness or inability to take PO and Inpatient level of care appropriate due to severity of illness   Dispo: The patient is from: Home              Anticipated d/c is to: Home              Anticipated d/c date is:1-2 days              Patient currently is not medically stable to d/c.  Consultants:   Pulmonary critical care   Procedures: None  Antimicrobials: Anti-infectives (From admission, onward)   None        Subjective: Seen and examined at bedside with assistance of the translator Mariana Kaufman [phone] patient was awake and not as agitated today and would fit between [place] and English would not speak with me.  He is complaining of some right  arm weakness and numbness and had very diminished grip strength.  He did have an IV in that arm and this was his Precedex drip was running so his Precedex drip has been turned off.  We will order a head CT scan.  Last night he had to be placed in 4 point restraints given his extreme agitation and because he was trying to stand up in the bed and jump off.  Blood pressure is lower this morning.  He states he did not feel good.  He denies any other concerns or complaints at this time.  Objective: Vitals:   06/08/20 1030 06/08/20 1100 06/08/20 1139 06/08/20 1200  BP: (!) 100/55 Marland Kitchen)  89/49  (!) 95/54  Pulse: 87 71  64  Resp: 16 14  15   Temp:   98.3 F (36.8 C)   TempSrc:   Axillary   SpO2: 100% 100%  100%  Weight:      Height:        Intake/Output Summary (Last 24 hours) at 06/08/2020 1349 Last data filed at 06/08/2020 1223 Gross per 24 hour  Intake 1476.08 ml  Output 875 ml  Net 601.08 ml   Filed Weights   06/04/20 0900 06/06/20 0500 06/08/20 0500  Weight: 67.8 kg 66.3 kg 63.8 kg   Examination: Physical Exam:  Constitutional: WN/WD slightly disheveled Hispanic male currently appears calmer today. Eyes: Lids and conjunctivae normal, sclerae anicteric  ENMT: External Ears, Nose appear normal. Grossly normal hearing.  Neck: Appears normal, supple, no cervical masses, normal ROM, no appreciable thyromegaly; no JVD Respiratory: Diminished to auscultation bilaterally with coarse breath sounds, no wheezing, rales, rhonchi or crackles. Normal respiratory effort and patient is not tachypenic. No accessory muscle use.  Unlabored breathing Cardiovascular: RRR, no murmurs / rubs / gallops. S1 and S2 auscultated.  Trace extremity edema Abdomen: Soft, non-tender, non-distended.  Bowel sounds positive.  GU: Deferred. Musculoskeletal: No clubbing / cyanosis of digits/nails.  Patient is in four-point restraints none.  He has diminished strength on the right grip and arm and has some diminished  sensation as well.  Right arm IV  Skin: No rashes, lesions, ulcers on to skin evaluation. No induration; Warm and dry.  Neurologic: CN 2-12 grossly intact with no focal deficits. Romberg sign and cerebellar reflexes not assessed.  Psychiatric: Impaired judgment and insight.  He is awake and alert. Normal mood and not agitated and appropriate affect.   Data Reviewed: I have personally reviewed following labs and imaging studies  CBC: Recent Labs  Lab 06/04/20 0347 06/05/20 0256 06/06/20 0256 06/07/20 0247 06/08/20 0246  WBC 9.4 6.2 8.6 15.2* 8.2  NEUTROABS 6.6  --   --  10.2* 4.3  HGB 12.9* 12.4* 13.3 14.4 13.8  HCT 37.9* 37.4* 39.7 43.7 41.5  MCV 96.7 100.0 98.3 100.0 99.5  PLT 60* 59* 81* 121* 125*   Basic Metabolic Panel: Recent Labs  Lab 06/04/20 0347 06/05/20 0256 06/06/20 0256 06/07/20 0247 06/08/20 0246  NA 133* 137 135 136 138  K 3.0* 3.0* 3.3* 4.3 3.4*  CL 95* 102 100 101 102  CO2 26 25 25 25 24   GLUCOSE 104* 117* 128* 119* 131*  BUN 10 6 8 7 8   CREATININE 0.48* 0.43* 0.44* 0.64 0.43*  CALCIUM 8.1* 8.6* 8.7* 9.2 8.9  MG 1.4* 1.6* 1.5* 2.0 1.9  PHOS  --  3.2 3.7 3.2 4.6   GFR: Estimated Creatinine Clearance: 114.1 mL/min (A) (by C-G formula based on SCr of 0.43 mg/dL (L)). Liver Function Tests: Recent Labs  Lab 06/04/20 0347 06/05/20 0256 06/06/20 0256 06/07/20 0247 06/08/20 0246  AST 135* 144* 307* 203* 212*  ALT 55* 55* 144* 149* 159*  ALKPHOS 88 79 105 103 89  BILITOT 1.6* 1.3* 0.9 1.1 1.0  PROT 6.9 6.4* 7.1 7.9 7.2  ALBUMIN 2.9* 2.7* 3.1* 3.3* 3.2*   No results for input(s): LIPASE, AMYLASE in the last 168 hours. Recent Labs  Lab 06/03/20 2106  AMMONIA 50*   Coagulation Profile: Recent Labs  Lab 06/04/20 0347  INR 1.2   Cardiac Enzymes: Recent Labs  Lab 06/03/20 1957 06/04/20 0347 06/05/20 0251 06/08/20 0246  CKTOTAL 1,885* 1,844* 764* 203   BNP (  last 3 results) No results for input(s): PROBNP in the last 8760  hours. HbA1C: No results for input(s): HGBA1C in the last 72 hours. CBG: No results for input(s): GLUCAP in the last 168 hours. Lipid Profile: No results for input(s): CHOL, HDL, LDLCALC, TRIG, CHOLHDL, LDLDIRECT in the last 72 hours. Thyroid Function Tests: No results for input(s): TSH, T4TOTAL, FREET4, T3FREE, THYROIDAB in the last 72 hours. Anemia Panel: No results for input(s): VITAMINB12, FOLATE, FERRITIN, TIBC, IRON, RETICCTPCT in the last 72 hours. Sepsis Labs: Recent Labs  Lab 06/03/20 2230 06/04/20 0100  LATICACIDVEN 1.1 1.8    Recent Results (from the past 240 hour(s))  Respiratory Panel by RT PCR (Flu A&B, Covid) -     Status: None   Collection Time: 06/03/20  9:45 PM  Result Value Ref Range Status   SARS Coronavirus 2 by RT PCR NEGATIVE NEGATIVE Final    Comment: (NOTE) SARS-CoV-2 target nucleic acids are NOT DETECTED.  The SARS-CoV-2 RNA is generally detectable in upper respiratoy specimens during the acute phase of infection. The lowest concentration of SARS-CoV-2 viral copies this assay can detect is 131 copies/mL. A negative result does not preclude SARS-Cov-2 infection and should not be used as the sole basis for treatment or other patient management decisions. A negative result may occur with  improper specimen collection/handling, submission of specimen other than nasopharyngeal swab, presence of viral mutation(s) within the areas targeted by this assay, and inadequate number of viral copies (<131 copies/mL). A negative result must be combined with clinical observations, patient history, and epidemiological information. The expected result is Negative.  Fact Sheet for Patients:  https://www.moore.com/https://www.fda.gov/media/142436/download  Fact Sheet for Healthcare Providers:  https://www.young.biz/https://www.fda.gov/media/142435/download  This test is no t yet approved or cleared by the [place] FDA and  has been authorized for detection and/or diagnosis of SARS-CoV-2 by FDA under an  Emergency Use Authorization (EUA). This EUA will remain  in effect (meaning this test can be used) for the duration of the COVID-19 declaration under Section 564(b)(1) of the Act, 21 U.S.C. section 360bbb-3(b)(1), unless the authorization is terminated or revoked sooner.     Influenza A by PCR NEGATIVE NEGATIVE Final   Influenza B by PCR NEGATIVE NEGATIVE Final    Comment: (NOTE) The Xpert Xpress SARS-CoV-2/FLU/RSV assay is intended as an aid in  the diagnosis of influenza from Nasopharyngeal swab specimens and  should not be used as a sole basis for treatment. Nasal washings and  aspirates are unacceptable for Xpert Xpress SARS-CoV-2/FLU/RSV  testing.  Fact Sheet for Patients: https://www.moore.com/https://www.fda.gov/media/142436/download  Fact Sheet for Healthcare Providers: https://www.young.biz/https://www.fda.gov/media/142435/download  This test is not yet approved or cleared by the [place] FDA and  has been authorized for detection and/or diagnosis of SARS-CoV-2 by  FDA under an Emergency Use Authorization (EUA). This EUA will remain  in effect (meaning this test can be used) for the duration of the  Covid-19 declaration under Section 564(b)(1) of the Act, 21  U.S.C. section 360bbb-3(b)(1), unless the authorization is  terminated or revoked. Performed at [facility], [address] [place], [place] [id number]   MRSA PCR Screening     Status: None   Collection Time: 06/04/20 10:59 PM   Specimen: Nasal Mucosa; Nasopharyngeal  Result Value Ref Range Status   MRSA by PCR NEGATIVE NEGATIVE Final    Comment:        The GeneXpert MRSA Assay (FDA approved for NASAL specimens only), is one component of a comprehensive MRSA colonization surveillance  program. It is not intended to diagnose MRSA infection nor to guide or monitor treatment for MRSA infections. Performed at [facility], [address] [place], [place] [address]   Culture, Urine     Status: Abnormal    Collection Time: 06/07/20  8:18 AM   Specimen: Urine, Clean Catch  Result Value Ref Range Status   Specimen Description   Final    URINE, CLEAN CATCH Performed at [facility], [address] [place], [place] [address]    Special Requests   Final    NONE Performed at [facility], [address] [place], [place] [address]    Culture (A)  Final    <10,000 COLONIES/mL INSIGNIFICANT GROWTH Performed at [facility], [address], [place], [place] [address]    Report Status 06/08/2020 FINAL  Final    RN Pressure Injury Documentation:     Estimated body mass index is 20.77 kg/m as calculated from the following:   Height as of this encounter: 5\' 9"  (1.753 m).   Weight as of this encounter: 63.8 kg.  Malnutrition Type:      Malnutrition Characteristics:      Nutrition Interventions:    Radiology Studies: CT HEAD WO CONTRAST  Result Date: 06/08/2020 CLINICAL DATA:  Neuro deficit. Suspected acute stroke. EXAM: CT HEAD WITHOUT CONTRAST TECHNIQUE: Contiguous axial images were obtained from the base of the skull through the vertex without intravenous contrast. COMPARISON:  07/06/2019 FINDINGS: Brain: Stable mildly enlarged ventricles and cortical sulci. Stable minimal patchy white matter low density in both cerebral hemispheres. No intracranial hemorrhage, mass lesion or CT evidence of acute infarction. Vascular: No hyperdense vessel or unexpected calcification. Skull: Normal. Negative for fracture or focal lesion. Sinuses/Orbits: Unremarkable. Other: None. IMPRESSION: 1. No acute abnormality. 2. Stable mild diffuse cerebral and cerebellar atrophy. 3. Stable minimal chronic small vessel white matter ischemic changes in both cerebral hemispheres. Electronically Signed   By: 14/09/2018 M.D.   On: 06/08/2020 10:27   DG CHEST PORT 1 VIEW  Result Date: 06/07/2020 CLINICAL DATA:  Fever EXAM: PORTABLE CHEST 1 VIEW COMPARISON:  09/23/2019  FINDINGS: Low lung volumes. No consolidation or edema. No pleural effusion or pneumothorax. Cardiomediastinal contours are within normal limits. IMPRESSION: No acute process in the chest. Electronically Signed   By: 09/25/2019 M.D.   On: 06/07/2020 08:10    Scheduled Meds: . apixaban  5 mg Oral BID  . Chlorhexidine Gluconate Cloth  6 each Topical Daily  . folic acid  1 mg Oral Daily  . lactulose  20 g Oral Daily  . mouth rinse  15 mL Mouth Rinse BID  . metoprolol  100 mg Oral Daily  . multivitamin with minerals  1 tablet Oral Daily  . pantoprazole  40 mg Oral Daily  . PHENObarbital  260 mg Intravenous TID   Followed by  . PHENObarbital  130 mg Intravenous TID   Followed by  . [START ON 06/10/2020] PHENObarbital  65 mg Intravenous TID  . sacubitril-valsartan  1 tablet Oral BID  . thiamine  100 mg Oral Daily   Continuous Infusions: . dexmedetomidine (PRECEDEX) IV infusion Stopped (06/08/20 0901)    LOS: 5 days   13/05/21, DO [facility] PAGER is on AMION  If 7PM-7AM, please contact night-coverage [email or web address]

## 2020-06-08 NOTE — Progress Notes (Signed)
eLink Physician-Brief Progress Note Patient Name: [place] Peters DOB: 1981/04/09 MRN: [id number]   Date of Service  06/08/2020  HPI/Events of Note  Agitation - Request for bilateral soft wrist and ankle restraints.   eICU Interventions  Will order bilateral soft wrist and ankle restraints X 6 hours.      Intervention Category Major Interventions: Delirium, psychosis, severe agitation - evaluation and management  Janai Maudlin Eugene 06/08/2020, 4:33 AM

## 2020-06-08 NOTE — Progress Notes (Signed)
eLink Physician-Brief Progress Note Patient Name: [place] Peters DOB: [birth year] MRN: [id number]   Date of Service  06/08/2020  HPI/Events of Note  Urinary retention - Bladder scan with 496 mL residual.   eICU Interventions  Plan: 1. I/O Cath PRN.      Intervention Category Major Interventions: Other:  Lenell Antu 06/08/2020, 6:44 AM

## 2020-06-08 NOTE — Progress Notes (Signed)
K+ 3.4 Replaced per protocol  

## 2020-06-08 NOTE — Progress Notes (Addendum)
   NAMEJustan Peters, MRN:  [id number], DOB:  01-21-1981, LOS: 5 ADMISSION DATE:  06/03/2020, CONSULTATION DATE:  06/07/20 REFERRING MD:  Marland Mcalpine, CHIEF COMPLAINT:  confusion   Brief History   39 year old man with hx of dilated cardiomyopathy, heavy EtOH abuse presenting after being found unconscious. Hospital course complicated by worsening DTs prompting ICU eval.   Past Medical History  Dilated cardiomyopathy Anemia Afib on AC PTA Noncompliance EtOH abuse  Significant Hospital Events   10/31 admitted  Consults:  n/a  Procedures:  n/a  Significant Diagnostic Tests:  CXR neg  Micro Data:  covid neg ua neg  Antimicrobials:  none   Interim history/subjective:  Awake, confused but able to re-direct   Objective   Blood pressure (Abnormal) 138/103, pulse 74, temperature 97.8 F (36.6 C), temperature source Axillary, resp. rate 16, height 5\' 9"  (1.753 m), weight 63.8 kg, SpO2 98 %.        Intake/Output Summary (Last 24 hours) at 06/08/2020 0840 Last data filed at 06/08/2020 0730 Gross per 24 hour  Intake 811.03 ml  Output 975 ml  Net -163.97 ml   Filed Weights   06/04/20 0900 06/06/20 0500 06/08/20 0500  Weight: 67.8 kg 66.3 kg 63.8 kg    Examination: General this is a 39 year old male who is resting in bed. He has a 30 with him HENT NCAT  Pulm clear  Card rrr abd soft and not tender  Ext warm and dry  Neuro confused and impulsive   Resolved Hospital Problem list   Leukocytosis   Assessment & Plan:   DTs- controlled with ativan + precedex Plan Cont thiamine and folate Cont phenobarb taper w/ on-going precedex wean (currerntly off but may need to go back on) Will need substance abuse counseling    Question of HE Plan Cont lactulose   Drug induced hypotension.  Plan Try to keep off precedex if we can  Hold toprol and Entresto For SBP <100  Mild acute on chronic transaminitis Plan Trend    Afib w/ h/o  CM Plan Cont DOAC and BB  Fluid and electrolyte imbalance: hypokalemia  Plan Replace and recheck  Best practice:  Diet: npo Pain/Anxiety/Delirium protocol (if indicated): see above VAP protocol (if indicated): n/a DVT prophylaxis: eliquis GI prophylaxis: n/a Glucose control: per primary Mobility: bed rest Code Status: full Family Communication: per primary Disposition: ICU pending precedex liberation  My cct 32 min  Recruitment consultant ACNP-BC [facility] Pulmonary/Critical Care Pager # [phone] OR # [phone] if no answer

## 2020-06-09 DIAGNOSIS — R6510 Systemic inflammatory response syndrome (SIRS) of non-infectious origin without acute organ dysfunction: Secondary | ICD-10-CM

## 2020-06-09 LAB — CBC WITH DIFFERENTIAL/PLATELET
Abs Immature Granulocytes: 0.08 10*3/uL — ABNORMAL HIGH (ref 0.00–0.07)
Basophils Absolute: 0.1 10*3/uL (ref 0.0–0.1)
Basophils Relative: 1 %
Eosinophils Absolute: 0.3 10*3/uL (ref 0.0–0.5)
Eosinophils Relative: 4 %
HCT: 40.4 % (ref 39.0–52.0)
Hemoglobin: 13.2 g/dL (ref 13.0–17.0)
Immature Granulocytes: 1 %
Lymphocytes Relative: 16 %
Lymphs Abs: 1.4 10*3/uL (ref 0.7–4.0)
MCH: 32.7 pg (ref 26.0–34.0)
MCHC: 32.7 g/dL (ref 30.0–36.0)
MCV: 100 fL (ref 80.0–100.0)
Monocytes Absolute: 2.6 10*3/uL — ABNORMAL HIGH (ref 0.1–1.0)
Monocytes Relative: 30 %
Neutro Abs: 4.1 10*3/uL (ref 1.7–7.7)
Neutrophils Relative %: 48 %
Platelets: 150 10*3/uL (ref 150–400)
RBC: 4.04 MIL/uL — ABNORMAL LOW (ref 4.22–5.81)
RDW: 11.8 % (ref 11.5–15.5)
WBC: 8.6 10*3/uL (ref 4.0–10.5)
nRBC: 0 % (ref 0.0–0.2)

## 2020-06-09 LAB — COMPREHENSIVE METABOLIC PANEL
ALT: 142 U/L — ABNORMAL HIGH (ref 0–44)
AST: 120 U/L — ABNORMAL HIGH (ref 15–41)
Albumin: 2.8 g/dL — ABNORMAL LOW (ref 3.5–5.0)
Alkaline Phosphatase: 80 U/L (ref 38–126)
Anion gap: 9 (ref 5–15)
BUN: <5 mg/dL — ABNORMAL LOW (ref 6–20)
CO2: 26 mmol/L (ref 22–32)
Calcium: 8.4 mg/dL — ABNORMAL LOW (ref 8.9–10.3)
Chloride: 101 mmol/L (ref 98–111)
Creatinine, Ser: 0.48 mg/dL — ABNORMAL LOW (ref 0.61–1.24)
GFR, Estimated: >60 mL/min (ref >60)
Glucose, Bld: 106 mg/dL — ABNORMAL HIGH (ref 70–99)
Potassium: 3.5 mmol/L (ref 3.5–5.1)
Sodium: 136 mmol/L (ref 135–145)
Total Bilirubin: 0.6 mg/dL (ref 0.3–1.2)
Total Protein: 6.5 g/dL (ref 6.5–8.1)

## 2020-06-09 LAB — MAGNESIUM: Magnesium: 1.6 mg/dL — ABNORMAL LOW (ref 1.7–2.4)

## 2020-06-09 LAB — PHOSPHORUS: Phosphorus: 3.4 mg/dL (ref 2.5–4.6)

## 2020-06-09 NOTE — Progress Notes (Signed)
NAMEAmari Peters, MRN:  [id number], DOB:  1980/10/30, LOS: 6 ADMISSION DATE:  06/03/2020, CONSULTATION DATE:  11/4 REFERRING MD:  Marland Mcalpine, CHIEF COMPLAINT:  confusion   Brief History   39 year old man with hx of dilated cardiomyopathy, heavy EtOH abuse presenting after being found unconscious. Hospital course complicated by worsening DTs prompting ICU eval.  Past Medical History  Dilated cardiomyopathy Anemia Afib on AC PTA Noncompliance EtOH abuse  Significant Hospital Events   10/31 admitted 11/4 PCCM precedex > off  Consults:  n/a  Procedures:    Significant Diagnostic Tests:  11/5 CT head IMPRESSION: 1. No acute abnormality. 2. Stable mild diffuse cerebral and cerebellar atrophy. 3. Stable minimal chronic small vessel white matter ischemic changes in both cerebral hemispheres.  Micro Data:  10/31 SARS COV 2 > neg  Antimicrobials:  n/a   Interim history/subjective:  Remains off precedex Had some weakness yesterday, better today Head CT negative  Objective   Blood pressure 116/74, pulse 67, temperature 98.8 F (37.1 C), temperature source Oral, resp. rate 14, height 5\' 9"  (1.753 m), weight 63.8 kg, SpO2 98 %.        Intake/Output Summary (Last 24 hours) at 06/09/2020 0730 Last data filed at 06/09/2020 0100 Gross per 24 hour  Intake 1909.05 ml  Output 1950 ml  Net -40.95 ml   Filed Weights   06/04/20 0900 06/06/20 0500 06/08/20 0500  Weight: 67.8 kg 66.3 kg 63.8 kg    Examination: General:  Resting comfortably in bed HENT: NCAT OP clear PULM: CTA B, normal effort CV: RRR, no mgr GI: BS+, soft, nontender MSK: normal bulk and tone Neuro: startles awake, follows commands, doesn't know where he is but he is conversant and interactive  Resolved Hospital Problem list     Assessment & Plan:  Acute metabolic encephalopathy presumably due to ativan and precedex in setting of alcohol withdrawal Alcohol abuse Wean off phenobarbital as  ordered Outpatient alcoholism treatment Thiamine, folate CIWA  Possible hepatic encephalopathy > no clear cirrhosis, not clear he has this Has fatty liver Counsel to quit drinking alcohol F/u with PCP  Chronic systolic heart failure Tele Monitor blood pressure  Alcoholic hepatitis> improving Monitor LFT prn  Atrial fibrillation tele    Best practice:   PCCM to sign off  Labs   CBC: Recent Labs  Lab 06/04/20 0347 06/05/20 0256 06/06/20 0256 06/07/20 0247 06/08/20 0246  WBC 9.4 6.2 8.6 15.2* 8.2  NEUTROABS 6.6  --   --  10.2* 4.3  HGB 12.9* 12.4* 13.3 14.4 13.8  HCT 37.9* 37.4* 39.7 43.7 41.5  MCV 96.7 100.0 98.3 100.0 99.5  PLT 60* 59* 81* 121* 125*    Basic Metabolic Panel: Recent Labs  Lab 06/04/20 0347 06/05/20 0256 06/06/20 0256 06/07/20 0247 06/08/20 0246  NA 133* 137 135 136 138  K 3.0* 3.0* 3.3* 4.3 3.4*  CL 95* 102 100 101 102  CO2 26 25 25 25 24   GLUCOSE 104* 117* 128* 119* 131*  BUN 10 6 8 7 8   CREATININE 0.48* 0.43* 0.44* 0.64 0.43*  CALCIUM 8.1* 8.6* 8.7* 9.2 8.9  MG 1.4* 1.6* 1.5* 2.0 1.9  PHOS  --  3.2 3.7 3.2 4.6   GFR: Estimated Creatinine Clearance: 114.1 mL/min (A) (by C-G formula based on SCr of 0.43 mg/dL (L)). Recent Labs  Lab 06/03/20 2230 06/04/20 0100 06/04/20 0347 06/05/20 0256 06/06/20 0256 06/07/20 0247 06/08/20 0246  WBC  --   --    < >  6.2 8.6 15.2* 8.2  LATICACIDVEN 1.1 1.8  --   --   --   --   --    < > = values in this interval not displayed.    Liver Function Tests: Recent Labs  Lab 06/04/20 0347 06/05/20 0256 06/06/20 0256 06/07/20 0247 06/08/20 0246  AST 135* 144* 307* 203* 212*  ALT 55* 55* 144* 149* 159*  ALKPHOS 88 79 105 103 89  BILITOT 1.6* 1.3* 0.9 1.1 1.0  PROT 6.9 6.4* 7.1 7.9 7.2  ALBUMIN 2.9* 2.7* 3.1* 3.3* 3.2*   No results for input(s): LIPASE, AMYLASE in the last 168 hours. Recent Labs  Lab 06/03/20 2106  AMMONIA 50*    ABG    Component Value Date/Time   HCO3 26.2  06/03/2020 2240   O2SAT 81.3 06/03/2020 2240     Coagulation Profile: Recent Labs  Lab 06/04/20 0347  INR 1.2    Cardiac Enzymes: Recent Labs  Lab 06/03/20 1957 06/04/20 0347 06/05/20 0251 06/08/20 0246  CKTOTAL 1,885* 1,844* 764* 203    HbA1C: Hgb A1c MFr Bld  Date/Time Value Ref Range Status  04/28/2019 02:01 AM 5.4 4.8 - 5.6 % Final    Comment:    (NOTE)         Prediabetes: 5.7 - 6.4         Diabetes: >6.4         Glycemic control for adults with diabetes: <7.0     CBG: No results for input(s): GLUCAP in the last 168 hours.   Critical care time: n/a      Heber McIntosh, MD [facility] Pager: [phone] Cell: [phone] If no response, call [phone]

## 2020-06-09 NOTE — Progress Notes (Signed)
PROGRESS NOTE    Vincent Peters  RUE:[id number] DOB: Mar 29, 1981 DOA: 06/03/2020 PCP: Patient, No Pcp Per   Brief Narrative:  HPI per Dr. Merrell Dike on 06/03/20 Vincent Peters  is a 39 y.o. male, with history of atrial fibrillation, dilated cardiomyopathy with an LVEF of 20 to 30%, hypertension, polysubstance abuse, and more who presents to the ED via EMS.  Chief complaint -someone called EMS because patient was unconscious in the middle of the street.  Patient does not speak [place], translator via iPad was attempted.  Translator repeated questions several times, we tried several different questions, patient was not answering questions.  ED provider reports that he was answering questions for her.  He reports that he typically drinks 10 beers per day.  He had 5 beers this morning before he went to work.  He has not drink anything since which is abnormal for him.  He got shaky and had nausea and vomiting at work.  He was denying hallucinations.  He did admit to a mild headache for her no visual change.  Again patient would answer any of these questions for me with the use of a translator.  Chart review reveals that patient was admitted in February 2021 for alcohol withdrawal as well.  At that time he had tremulousness, restlessness, narrow complex tachycardia as well as A. fib with RVR.  He transiently required a Cardizem drip.  Cardiology did see him during that hospital course, and he followed up with cardiology after the discharge as well.  He had echocardiogram at that time that showed systolic CHF with an EF of 20 to 30%.  Patient was started on Entresto during this hospitalization as well as metoprolol.  He was not started on a statin and that is likely because of his alcoholic hepatitis with transaminitis and hyperammonemia during that stay.  His withdrawal symptoms worsen during his hospitalization and he required the Precedex drip for 3 days.  For this reason  patient will be admitted to progressive care here in case he also requires Precedex during this hospitalization.  **Interim History Patient was still having some withdrawal symptoms but off the Precedex drip now.  Is slowly improving started regressing so as needed Ativan was added back in addition to his scheduled Ativan every 12.  Will consult TOC for substance abuse resources.  Overnight patient spiked a temperature and his WBC worsened.  He has SIRS criteria with no identifiable source of infection currently.  Will hold off on starting antibiotics and patient has refused blood cultures.  06/08/2020: Overnight patient significantly decompensated and had to be placed back on a Precedex drip.  Critical care was consulted for further evaluation recommendations and they started a phenobarbital taper and attempt to wean off the Precedex drip.  He was also placed in violent restraints as he was found trying to stand up and jump out of the bed.  He also had an in and out catheterization due to acute urinary retention.  This morning he is much more awake and calmer his blood pressure is lower due to the Precedex.  He is main complaint now is that he has weakness in his right arm and hand and diminished sensation so will obtain a head CT scan to rule out any hemorrhagic CVA given that he is on Eliquis.  We will continue to monitor and patient IV is in the arm so we will need to reassess his IV sites given that the IVs in his right upper arm.  Will  order a head CT scan. Appreciate critical care evaluation.  06/09/2020: Was calmer today and he had more motion in his right arm and not complaining of any weakness today.  He does complain of pain in his IV site.  He has been weaned off of his Precedex drip now on a phenobarbital taper.  He is more stable today and we will transfer him to the floor.  Assessment & Plan:   Active Problems:   Delirium tremens (HCC)   SIRS (systemic inflammatory response syndrome)  (HCC)  Alcohol withdrawal with Delirium Tremens Acute Metabolic Encephalopathy in the setting of withdrawal and having hallucinations now again -Patient was back on Precedex gtt given significant decompensation the night before last but now is off; day Precedex was being held due to Hypotension -Continue CIWA Protocol and have added back as needed lorazepam 0-4 milligrams every as needed for this abdominal withdrawal protocol -C/w Folic Acid, Thiamine, and MVI+ Minerals  -UDS was negative on admission.  Has a history of cocaine use -CIWA elevated and now he is having hallucinations and tremors -Patient states that he quit drinking 3 days ago. -Continue lactulose 20 g p.o. daily -Critical care consulted for further evaluation and they recommend a phenobarbital taper; PRecedex was held due to Hypotension and have now been discontinued Follow phenobarb taper per PCCM recs  Rhabdomyolysis -He was found down for an unknown amount of time, CK around 1800, stable today, continue limited IV fluids 500 cc bolus given heart failure.  Recheck CK  Is now 203 and normalized  -CK Trending Down and will not repeat in the AM  Chronic systolic CHF -With an EF of 20-30%, patient is noncompliant with his home medications, resume here and monitor. -BNP was 114.6 on admission -He appears to be tolerating medications well, his blood pressure is good, continue his home medications with Metoprolol Succinate 100 mg p.o. daily as well as Entresto 49-51 1 tab p.o. twice daily -He again appears euvolemic this morning; He is -878.9 mL since admission  Paroxysmal A. Fib -C/w Telemetry Monitoring  -Continue Metoprolol and blood pressure allows, Eliquis.   Hyponatremia -Due to dehydration, received a limited bolus yesterday and sodium has now normalized -Sodium is now 138 yesterday and is now 136  GERD -Continue with pantoprazole 40 mg p.o. daily  Normocytic Anemia -Likely due to liver disease, he is  macrocytic/normocytic.   -Hgb/Hct went from 12.9/37.9 -> 12.4/37.4 -> 13.3/39.7 -> 14.4/43.7 -> 13.8/41.5 and today it is 13.2/40.4 -Anemia panel fairly unremarkable as it showed an iron level of 73, U IBC 223, TIBC 296, saturation ratios of 25%, ferritin level 121, folate level 9.0, vitamin B12 576 -Continue to monitor for signs and symptoms of bleeding; currently no overt bleeding noted  -Repeat CBC in a.m.  Hypertensive Urgency --> Drug Induced Hypotension -Improved and now Hypotensive in the setting of Precedex gtt -Patient last blood pressure was 95/54 but was as low as 70/40 yesterday AM -Continue to monitor blood pressures per protocol and Precedex has been held -Hold metoprolol tartrate as well as Entresto given his SBP less than 100; now blood pressure is improved and was 134/93  Thromobocytopenia -Patient's platelet count is slowly improving and went from 60,000 and is now 150,000 -Continue to monitor for signs and symptoms of bleeding; currently no overt bleeding noted -Repeat CBC in a.m.  Hyperbilirbuinemia -Improved and likely was reactive -Patient's T Bili went from 1.6 and is now 0.6 -Continue to Monitor and Replete as Necessary -Repeat CMP in  the AM   Abnormal LFTs, relatively stable  -Slightly worsening in the setting of Underlying Liver Disease due to EtOH -AST went from 135 -> 144 -> 307 -> 203 -> 212 and it is now 120 -ALT went from 55 -> 144 -> 149 -> 159 and is now 142 -Check RUQ U/S and an Acute Hepatitis Panel if not improving or continues to worsen -Continue to Monitor and Trend Hepatic Fxn Panel -Repeat CMP in AM   Hypokalemia -Mild at 3.5 -Magnesium level is now 1.6 -Replete with p.o. KCl -Continue to Monitor and Replete as Necessary -Repeat CMP in the AM  Hypomagnesemia -Patient's Mag Level is 1.6 -Replete with IV mag sulfate 2 g -Continue to Monitor and Replete as Necessary  -Repeat Mag Level in the AM  SIRS -No identifiable infection source  noted currently and his temperatures resolved along with his leukocytosis -Patient was febrile at 100.6 yesterday morning and now has a new leukocytosis to 15.2 but this is now improved and has fever has resolved and his WBC is now 8.2 -He was tachycardic and tachypneic but his tachycardia has improved but is still slightly tachypneic -Urinalysis is relatively unremarkable and showed small hemoglobin, negative ketones, negative nitrites, negative leukocytes, rare bacteria, 0-5 RBCs per high-power field, 0-5 WBCs -Urine culture still pending -Chest x-ray the day before yesterday morning showed "No acute process in the chest." -Wanted to obtain blood cultures but patient adamantly refused -Patient's leukocytosis is stable and he has not been febrile.  Likely isolated fever  Right arm weakness and diminished strength, improved -Obtain a head CT scan without contrast given that he has been on anticoagulation  -He has an IV in the right upper arm and states that his arm also hurts but he has diminished sensation and grip strength. -Blood pressure was also low at this time and will need to continue monitor and if continues to persist will consider an MRI and discussion with neurology as he has no other focal deficits noted no facial droop or slurred speech -His right arm is improved and only complaint of pain at his IV site  Acute Urinary Retention -He had a bladder scan overnight with 496 mL which was residual  -He had an INO cath ordered as needed -Continue to monitor as needed  DVT prophylaxis: Anticoagulated with apixaban Code Status: FULL CODE Family Communication: No family present at bedside  Disposition Plan: Pending further clinical improvement and he is improved from a withdrawal standpoint but still not out of the woods and on a phenobarbital taper.  We will transfer him to the medical floor as he is stable blood pressure was  Status is: Inpatient  Remains inpatient appropriate  because:Unsafe d/c plan, IV treatments appropriate due to intensity of illness or inability to take PO and Inpatient level of care appropriate due to severity of illness   Dispo: The patient is from: Home              Anticipated d/c is to: Home              Anticipated d/c date is:1-2 days              Patient currently is not medically stable to d/c.  Consultants:   Pulmonary critical care   Procedures: None  Antimicrobials: Anti-infectives (From admission, onward)   None       Subjective: Seen and examined at bedside with assistance of the translator Massiel [phone] patient and he was doing much better  today.  Denied any chest pain, lightheadedness or dizziness.  No nausea or vomiting.  Felt okay.  Complained of some right arm pain but was not weak today.  No chest pain, lightheadedness or dizziness and appeared calmer.  Objective: Vitals:   06/09/20 1000 06/09/20 1100 06/09/20 1200 06/09/20 1251  BP: 127/62 122/84 (!) 134/93   Pulse: 80 69 69   Resp:      Temp:    98.5 F (36.9 C)  TempSrc:    Oral  SpO2: 94% 98% 96%   Weight:      Height:        Intake/Output Summary (Last 24 hours) at 06/09/2020 1734 Last data filed at 06/09/2020 1200 Gross per 24 hour  Intake 360 ml  Output 2520 ml  Net -2160 ml   Filed Weights   06/04/20 0900 06/06/20 0500 06/08/20 0500  Weight: 67.8 kg 66.3 kg 63.8 kg   Examination: Physical Exam:  Constitutional: WN/WD slightly disheveled Hispanic male currently appears calm today and not in any acute distress and not in much withdrawal Eyes: Lids and conjunctivae normal, sclerae anicteric  ENMT: External Ears, Nose appear normal. Grossly normal hearing. Neck: Appears normal, supple, no cervical masses, normal ROM, no appreciable thyromegaly; no JVD Respiratory: Diminished to auscultation bilaterally, no wheezing, rales, rhonchi or crackles. Normal respiratory effort and patient is not tachypenic. No accessory muscle use.  Unlabored  breathing Cardiovascular: RRR, no murmurs / rubs / gallops. S1 and S2 auscultated.  Trace extremity edema Abdomen: Soft, non-tender, non-distended. Bowel sounds positive.  GU: Deferred. Musculoskeletal: No clubbing / cyanosis of digits/nails. No joint deformity upper and lower extremities. Skin: No rashes, lesions, ulcers on limited skin evaluation. No induration; Warm and dry.  Neurologic: CN 2-12 grossly intact with no focal deficits. Romberg sign and cerebellar reflexes not assessed.  Psychiatric: Normal judgment and insight. Alert and oriented x 3.  Pleasant and calm mood and appropriate affect.   Data Reviewed: I have personally reviewed following labs and imaging studies  CBC: Recent Labs  Lab 06/04/20 0347 06/04/20 0347 06/05/20 0256 06/06/20 0256 06/07/20 0247 06/08/20 0246 06/09/20 1132  WBC 9.4   < > 6.2 8.6 15.2* 8.2 8.6  NEUTROABS 6.6  --   --   --  10.2* 4.3 4.1  HGB 12.9*   < > 12.4* 13.3 14.4 13.8 13.2  HCT 37.9*   < > 37.4* 39.7 43.7 41.5 40.4  MCV 96.7   < > 100.0 98.3 100.0 99.5 100.0  PLT 60*   < > 59* 81* 121* 125* 150   < > = values in this interval not displayed.   Basic Metabolic Panel: Recent Labs  Lab 06/05/20 0256 06/06/20 0256 06/07/20 0247 06/08/20 0246 06/09/20 1132  NA 137 135 136 138 136  K 3.0* 3.3* 4.3 3.4* 3.5  CL 102 100 101 102 101  CO2 25 25 25 24 26   GLUCOSE 117* 128* 119* 131* 106*  BUN 6 8 7 8  <5*  CREATININE 0.43* 0.44* 0.64 0.43* 0.48*  CALCIUM 8.6* 8.7* 9.2 8.9 8.4*  MG 1.6* 1.5* 2.0 1.9 1.6*  PHOS 3.2 3.7 3.2 4.6 3.4   GFR: Estimated Creatinine Clearance: 114.1 mL/min (A) (by C-G formula based on SCr of 0.48 mg/dL (L)). Liver Function Tests: Recent Labs  Lab 06/05/20 0256 06/06/20 0256 06/07/20 0247 06/08/20 0246 06/09/20 1132  AST 144* 307* 203* 212* 120*  ALT 55* 144* 149* 159* 142*  ALKPHOS 79 105 103 89 80  BILITOT 1.3*  0.9 1.1 1.0 0.6  PROT 6.4* 7.1 7.9 7.2 6.5  ALBUMIN 2.7* 3.1* 3.3* 3.2* 2.8*   No  results for input(s): LIPASE, AMYLASE in the last 168 hours. Recent Labs  Lab 06/03/20 2106  AMMONIA 50*   Coagulation Profile: Recent Labs  Lab 06/04/20 0347  INR 1.2   Cardiac Enzymes: Recent Labs  Lab 06/03/20 1957 06/04/20 0347 06/05/20 0251 06/08/20 0246  CKTOTAL 1,885* 1,844* 764* 203   BNP (last 3 results) No results for input(s): PROBNP in the last 8760 hours. HbA1C: No results for input(s): HGBA1C in the last 72 hours. CBG: No results for input(s): GLUCAP in the last 168 hours. Lipid Profile: No results for input(s): CHOL, HDL, LDLCALC, TRIG, CHOLHDL, LDLDIRECT in the last 72 hours. Thyroid Function Tests: No results for input(s): TSH, T4TOTAL, FREET4, T3FREE, THYROIDAB in the last 72 hours. Anemia Panel: No results for input(s): VITAMINB12, FOLATE, FERRITIN, TIBC, IRON, RETICCTPCT in the last 72 hours. Sepsis Labs: Recent Labs  Lab 06/03/20 2230 06/04/20 0100  LATICACIDVEN 1.1 1.8    Recent Results (from the past 240 hour(s))  Respiratory Panel by RT PCR (Flu A&B, Covid) -     Status: None   Collection Time: 06/03/20  9:45 PM  Result Value Ref Range Status   SARS Coronavirus 2 by RT PCR NEGATIVE NEGATIVE Final    Comment: (NOTE) SARS-CoV-2 target nucleic acids are NOT DETECTED.  The SARS-CoV-2 RNA is generally detectable in upper respiratoy specimens during the acute phase of infection. The lowest concentration of SARS-CoV-2 viral copies this assay can detect is 131 copies/mL. A negative result does not preclude SARS-Cov-2 infection and should not be used as the sole basis for treatment or other patient management decisions. A negative result may occur with  improper specimen collection/handling, submission of specimen other than nasopharyngeal swab, presence of viral mutation(s) within the areas targeted by this assay, and inadequate number of viral copies (<131 copies/mL). A negative result must be combined with clinical observations, patient  history, and epidemiological information. The expected result is Negative.  Fact Sheet for Patients:  https://www.moore.com/  Fact Sheet for Healthcare Providers:  https://www.young.biz/  This test is no t yet approved or cleared by the [place] FDA and  has been authorized for detection and/or diagnosis of SARS-CoV-2 by FDA under an Emergency Use Authorization (EUA). This EUA will remain  in effect (meaning this test can be used) for the duration of the COVID-19 declaration under Section 564(b)(1) of the Act, 21 U.S.C. section 360bbb-3(b)(1), unless the authorization is terminated or revoked sooner.     Influenza A by PCR NEGATIVE NEGATIVE Final   Influenza B by PCR NEGATIVE NEGATIVE Final    Comment: (NOTE) The Xpert Xpress SARS-CoV-2/FLU/RSV assay is intended as an aid in  the diagnosis of influenza from Nasopharyngeal swab specimens and  should not be used as a sole basis for treatment. Nasal washings and  aspirates are unacceptable for Xpert Xpress SARS-CoV-2/FLU/RSV  testing.  Fact Sheet for Patients: https://www.moore.com/  Fact Sheet for Healthcare Providers: https://www.young.biz/  This test is not yet approved or cleared by the [place] FDA and  has been authorized for detection and/or diagnosis of SARS-CoV-2 by  FDA under an Emergency Use Authorization (EUA). This EUA will remain  in effect (meaning this test can be used) for the duration of the  Covid-19 declaration under Section 564(b)(1) of the Act, 21  U.S.C. section 360bbb-3(b)(1), unless the authorization is  terminated or revoked. Performed at [facility]  [facility], [address] [place], [place] [address]   MRSA PCR Screening     Status: None   Collection Time: 06/04/20 10:59 PM   Specimen: Nasal Mucosa; Nasopharyngeal  Result Value Ref Range Status   MRSA by PCR NEGATIVE NEGATIVE Final    Comment:         The GeneXpert MRSA Assay (FDA approved for NASAL specimens only), is one component of a comprehensive MRSA colonization surveillance program. It is not intended to diagnose MRSA infection nor to guide or monitor treatment for MRSA infections. Performed at [facility], [address], [place], [place] [address]   Culture, Urine     Status: Abnormal   Collection Time: 06/07/20  8:18 AM   Specimen: Urine, Clean Catch  Result Value Ref Range Status   Specimen Description   Final    URINE, CLEAN CATCH Performed at [facility], [address] [place], [place] [address]    Special Requests   Final    NONE Performed at [facility], [address], [place], [place] [address]    Culture (A)  Final    <10,000 COLONIES/mL INSIGNIFICANT GROWTH Performed at [facility], [address] [place], [place] [address]    Report Status 06/08/2020 FINAL  Final    RN Pressure Injury Documentation:     Estimated body mass index is 20.77 kg/m as calculated from the following:   Height as of this encounter: 5\' 9"  (1.753 m).   Weight as of this encounter: 63.8 kg.  Malnutrition Type:      Malnutrition Characteristics:      Nutrition Interventions:    Radiology Studies: CT HEAD WO CONTRAST  Result Date: 06/08/2020 CLINICAL DATA:  Neuro deficit. Suspected acute stroke. EXAM: CT HEAD WITHOUT CONTRAST TECHNIQUE: Contiguous axial images were obtained from the base of the skull through the vertex without intravenous contrast. COMPARISON:  07/06/2019 FINDINGS: Brain: Stable mildly enlarged ventricles and cortical sulci. Stable minimal patchy white matter low density in both cerebral hemispheres. No intracranial hemorrhage, mass lesion or CT evidence of acute infarction. Vascular: No hyperdense vessel or unexpected calcification. Skull: Normal. Negative for fracture or focal lesion. Sinuses/Orbits: Unremarkable. Other: None.  IMPRESSION: 1. No acute abnormality. 2. Stable mild diffuse cerebral and cerebellar atrophy. 3. Stable minimal chronic small vessel white matter ischemic changes in both cerebral hemispheres. Electronically Signed   By: 14/09/2018 M.D.   On: 06/08/2020 10:27    Scheduled Meds:  apixaban  5 mg Oral BID   Chlorhexidine Gluconate Cloth  6 each Topical Daily   folic acid  1 mg Oral Daily   lactulose  20 g Oral Daily   mouth rinse  15 mL Mouth Rinse BID   metoprolol  100 mg Oral Daily   multivitamin with minerals  1 tablet Oral Daily   pantoprazole  40 mg Oral Daily   PHENObarbital  130 mg Intravenous TID   Followed by   13/12/2019 ON 06/10/2020] PHENObarbital  65 mg Intravenous TID   sacubitril-valsartan  1 tablet Oral BID   thiamine  100 mg Oral Daily   Continuous Infusions:   LOS: 6 days   13/02/2020, DO [facility] PAGER is on AMION  If 7PM-7AM, please contact night-coverage [email or web address]

## 2020-06-10 DIAGNOSIS — F1010 Alcohol abuse, uncomplicated: Secondary | ICD-10-CM

## 2020-06-10 LAB — COMPREHENSIVE METABOLIC PANEL
ALT: 121 U/L — ABNORMAL HIGH (ref 0–44)
AST: 96 U/L — ABNORMAL HIGH (ref 15–41)
Albumin: 2.9 g/dL — ABNORMAL LOW (ref 3.5–5.0)
Alkaline Phosphatase: 85 U/L (ref 38–126)
Anion gap: 8 (ref 5–15)
BUN: <5 mg/dL — ABNORMAL LOW (ref 6–20)
CO2: 29 mmol/L (ref 22–32)
Calcium: 8.7 mg/dL — ABNORMAL LOW (ref 8.9–10.3)
Chloride: 100 mmol/L (ref 98–111)
Creatinine, Ser: 0.55 mg/dL — ABNORMAL LOW (ref 0.61–1.24)
GFR, Estimated: >60 mL/min (ref >60)
Glucose, Bld: 110 mg/dL — ABNORMAL HIGH (ref 70–99)
Potassium: 3.4 mmol/L — ABNORMAL LOW (ref 3.5–5.1)
Sodium: 137 mmol/L (ref 135–145)
Total Bilirubin: 0.5 mg/dL (ref 0.3–1.2)
Total Protein: 6.6 g/dL (ref 6.5–8.1)

## 2020-06-10 LAB — CBC WITH DIFFERENTIAL/PLATELET
Abs Immature Granulocytes: 0.13 10*3/uL — ABNORMAL HIGH (ref 0.00–0.07)
Basophils Absolute: 0.1 10*3/uL (ref 0.0–0.1)
Basophils Relative: 1 %
Eosinophils Absolute: 0.4 10*3/uL (ref 0.0–0.5)
Eosinophils Relative: 4 %
HCT: 40.1 % (ref 39.0–52.0)
Hemoglobin: 13.4 g/dL (ref 13.0–17.0)
Immature Granulocytes: 1 %
Lymphocytes Relative: 17 %
Lymphs Abs: 1.6 10*3/uL (ref 0.7–4.0)
MCH: 33.8 pg (ref 26.0–34.0)
MCHC: 33.4 g/dL (ref 30.0–36.0)
MCV: 101 fL — ABNORMAL HIGH (ref 80.0–100.0)
Monocytes Absolute: 2.7 10*3/uL — ABNORMAL HIGH (ref 0.1–1.0)
Monocytes Relative: 28 %
Neutro Abs: 4.6 10*3/uL (ref 1.7–7.7)
Neutrophils Relative %: 49 %
Platelets: 183 10*3/uL (ref 150–400)
RBC: 3.97 MIL/uL — ABNORMAL LOW (ref 4.22–5.81)
RDW: 11.7 % (ref 11.5–15.5)
WBC: 9.4 10*3/uL (ref 4.0–10.5)
nRBC: 0 % (ref 0.0–0.2)

## 2020-06-10 LAB — PHOSPHORUS: Phosphorus: 3 mg/dL (ref 2.5–4.6)

## 2020-06-10 LAB — MAGNESIUM: Magnesium: 1.8 mg/dL (ref 1.7–2.4)

## 2020-06-10 MED ORDER — MAGNESIUM SULFATE 2 GM/50ML IV SOLN
2.0000 g | Freq: Once | INTRAVENOUS | Status: AC
  Administered 2020-06-10: 2 g via INTRAVENOUS
  Filled 2020-06-10: qty 50

## 2020-06-10 MED ORDER — POTASSIUM CHLORIDE CRYS ER 20 MEQ PO TBCR
40.0000 meq | EXTENDED_RELEASE_TABLET | Freq: Two times a day (BID) | ORAL | Status: AC
  Administered 2020-06-10 (×2): 40 meq via ORAL
  Filled 2020-06-10 (×2): qty 2

## 2020-06-10 NOTE — Progress Notes (Signed)
PROGRESS NOTE    Vincent Peters  WUJ:[id number] DOB: Aug 01, 1981 DOA: 06/03/2020 PCP: Patient, No Pcp Per   Brief Narrative:  HPI per Dr. Joshuan Dike on 06/03/20 Vincent Peters  is a 39 y.o. male, with history of atrial fibrillation, dilated cardiomyopathy with an LVEF of 20 to 30%, hypertension, polysubstance abuse, and more who presents to the ED via EMS.  Chief complaint -someone called EMS because patient was unconscious in the middle of the street.  Patient does not speak [place], translator via iPad was attempted.  Translator repeated questions several times, we tried several different questions, patient was not answering questions.  ED provider reports that he was answering questions for her.  He reports that he typically drinks 10 beers per day.  He had 5 beers this morning before he went to work.  He has not drink anything since which is abnormal for him.  He got shaky and had nausea and vomiting at work.  He was denying hallucinations.  He did admit to a mild headache for her no visual change.  Again patient would answer any of these questions for me with the use of a translator.  Chart review reveals that patient was admitted in February 2021 for alcohol withdrawal as well.  At that time he had tremulousness, restlessness, narrow complex tachycardia as well as A. fib with RVR.  He transiently required a Cardizem drip.  Cardiology did see him during that hospital course, and he followed up with cardiology after the discharge as well.  He had echocardiogram at that time that showed systolic CHF with an EF of 20 to 30%.  Patient was started on Entresto during this hospitalization as well as metoprolol.  He was not started on a statin and that is likely because of his alcoholic hepatitis with transaminitis and hyperammonemia during that stay.  His withdrawal symptoms worsen during his hospitalization and he required the Precedex drip for 3 days.  For this reason  patient will be admitted to progressive care here in case he also requires Precedex during this hospitalization.  **Interim History Patient was still having some withdrawal symptoms but off the Precedex drip now.  Is slowly improving started regressing so as needed Ativan was added back in addition to his scheduled Ativan every 12.  Will consult TOC for substance abuse resources.  Overnight patient spiked a temperature and his WBC worsened.  He has SIRS criteria with no identifiable source of infection currently.  Will hold off on starting antibiotics and patient has refused blood cultures.  06/08/2020: Overnight patient significantly decompensated and had to be placed back on a Precedex drip.  Critical care was consulted for further evaluation recommendations and they started a phenobarbital taper and attempt to wean off the Precedex drip.  He was also placed in violent restraints as he was found trying to stand up and jump out of the bed.  He also had an in and out catheterization due to acute urinary retention.  This morning he is much more awake and calmer his blood pressure is lower due to the Precedex.  He is main complaint now is that he has weakness in his right arm and hand and diminished sensation so will obtain a head CT scan to rule out any hemorrhagic CVA given that he is on Eliquis.  We will continue to monitor and patient IV is in the arm so we will need to reassess his IV sites given that the IVs in his right upper arm.  Will  order a head CT scan. Appreciate critical care evaluation.  06/09/2020: Was calmer today and he had more motion in his right arm and not complaining of any weakness today.  He does complain of pain in his IV site.  He has been weaned off of his Precedex drip now on a phenobarbital taper.  He is more stable today and we will transfer him to the floor.  06/10/2020: Patient was now transferred to medical floor and appears calmer today.  CIWA score this morning was 0.  He  continues to be on a phenobarbital taper and will end on Tuesday.  Anticipating discharge on Tuesday once phenobarbital tapered finishes.  He is complaining of some slight right arm numbness but had an IV infiltrate and feels better with an ice pack.  Electrolytes are being repleted.  He is improved from yesterday.  Assessment & Plan:   Active Problems:   Delirium tremens (HCC)   SIRS (systemic inflammatory response syndrome) (HCC)  Alcohol withdrawal with Delirium Tremens, improving Acute Metabolic Encephalopathy in the setting of withdrawal and having hallucinations now again -Initially put on Precedex drip and was weaned off and then significantly decompensated again a few days ago and had to be put back on the Precedex.  Precedex was held due to hypotension and has now been weaned off. -Continue CIWA Protocol and have added back as needed lorazepam 0-4 milligrams every as needed for this abdominal withdrawal protocol; his CIWA scores have improved and last few ones were 0 -C/w Folic Acid, Thiamine, and MVI+ Minerals  -UDS was negative on admission.  Has a history of cocaine use -CIWA elevated and now he is having hallucinations and tremors -Patient states that he quit drinking 3 days ago. -Continue lactulose 20 g p.o. daily -Critical care consulted for further evaluation and they recommend a phenobarbital taper and this is in place and he is getting 138 mg 3 times daily and then will be changed to 65 mg 3 times daily for 6 doses and this N Tuesday; PRecedex was held due to Hypotension and have now been discontinued -Follow phenobarb taper per PCCM recs and will discharge the patient home afterwards  Rhabdomyolysis -He was found down for an unknown amount of time, CK around 1800, stable today, continue limited IV fluids 500 cc bolus given heart failure.  Recheck CK  Is now 203 and normalized  -CK Trending Down and will not repeat in the AM  Chronic systolic CHF -With an EF of 20-30%,  patient is noncompliant with his home medications, resume here and monitor. -BNP was 114.6 on admission -He appears to be tolerating medications well, his blood pressure is good, continue his home medications with Metoprolol Succinate 100 mg p.o. daily as well as Entresto 49-51 1 tab p.o. twice daily -He again appears euvolemic this morning; He is - 3,092 mL since admission  Paroxysmal A. Fib -C/w Telemetry Monitoring  -Continue Metoprolol and blood pressure allows, Eliquis.   Hyponatremia -Due to dehydration, received a limited bolus yesterday and sodium has now normalized -Sodium is now 138 yesterday and is now 136  GERD -Continue with pantoprazole 40 mg p.o. daily  Normocytic/Macrocytic Anemia -Likely due to liver disease, he is macrocytic/normocytic.   -Hgb/Hct stable at 13.4/40.1 -Anemia panel fairly unremarkable as it showed an iron level of 73, U IBC 223, TIBC 296, saturation ratios of 25%, ferritin level 121, folate level 9.0, vitamin B12 576 -Continue to monitor for signs and symptoms of bleeding; currently no overt bleeding noted  -  Repeat CBC in a.m.  Hypertensive Urgency --> Drug Induced Hypotension, improved  -Improved. Was Hypotensive in the setting of Precedex gtt -Continue to monitor blood pressures per protocol and Precedex has been held -Hold metoprolol tartrate as well as Entresto when hisSBP less than 100; now blood pressure is improved and was 124/79  Thromobocytopenia -Patient's platelet count is slowly improving and went from 60,000 and is now 183,000 -Continue to monitor for signs and symptoms of bleeding; currently no overt bleeding noted -Repeat CBC in a.m.  Hyperbilirbuinemia -Improved and likely was reactive -Patient's T Bili went from 1.6 and is now 0.5 -Continue to Monitor and Replete as Necessary -Repeat CMP in the AM   Abnormal LFTs, relatively stable  -Slightly worsening in the setting of Underlying Liver Disease due to EtOH -AST went from  135 -> 144 -> 307 -> 203 -> 212 -> 120 -> 96 -ALT went from 55 -> 144 -> 149 -> 159 -> 142 -> 121 -Check RUQ U/S and an Acute Hepatitis Panel if not improving or continues to worsen -Continue to Monitor and Trend Hepatic Fxn Panel -Repeat CMP in AM   Hypokalemia -Mild at 3.4 -Magnesium level is now 1.8 -Replete with p.o. KCl 40 mEQ BID x2 -Continue to Monitor and Replete as Necessary -Repeat CMP in the AM  Hypomagnesemia -Patient's Mag Level is 1.8 -Replete with IV mag sulfate 2 g again  -Continue to Monitor and Replete as Necessary  -Repeat Mag Level in the AM  SIRS, improved  -No identifiable infection source noted currently and his temperatures resolved along with his leukocytosis -Patient was febrile at 100.6 yesterday morning and now has a new leukocytosis to 15.2 but this is now improved and has fever has resolved and his WBC is now 8.2 -He was tachycardic and tachypneic but his tachycardia has improved but is still slightly tachypneic -Urinalysis is relatively unremarkable and showed small hemoglobin, negative ketones, negative nitrites, negative leukocytes, rare bacteria, 0-5 RBCs per high-power field, 0-5 WBCs -Urine culture still pending -Chest x-ray the day before yesterday morning showed "No acute process in the chest." -Wanted to obtain blood cultures but patient adamantly refused -Patient's leukocytosis is stable and he has not been febrile.  Likely isolated fever  Right arm weakness and diminished strength, improved -Obtain a head CT scan without contrast given that he has been on anticoagulation  -He has an IV in the right upper arm and states that his arm also hurts but he has diminished sensation and grip strength. -Blood pressure was also low at this time and will need to continue monitor and if continues to persist will consider an MRI and discussion with neurology as he has no other focal deficits noted no facial droop or slurred speech -His right arm is improved  and only complaint of pain at his IV site -He is complaining of some numbness along the medial aspect of his forearm but this was improved with an ice pack.  He had an IV there yesterday and likely infiltrated  Acute Urinary Retention -He had a bladder scan overnight with 496 mL which was residual  -He had an INO cath ordered as needed -Continue to monitor as needed  DVT prophylaxis: Anticoagulated with apixaban and will continue Code Status: FULL CODE Family Communication: No family present at bedside  Disposition Plan: We will continue his phenobarbital taper and discharge him home once this is complete  Status is: Inpatient  Remains inpatient appropriate because:Unsafe d/c plan, IV treatments appropriate due to  intensity of illness or inability to take PO and Inpatient level of care appropriate due to severity of illness   Dispo: The patient is from: Home              Anticipated d/c is to: Home              Anticipated d/c date is:1-2 days; anticipated discharge date is 06/12/2020              Patient currently is not medically stable to d/c.  Consultants:   Pulmonary critical care   Procedures: None  Antimicrobials: Anti-infectives (From admission, onward)   None       Subjective: Seen and examined at bedside with assistance of the translator Genella RifeSilvia [phone] patient and he was doing much better today and he was extremely calm.  Denies chest pain, lightheadedness or dizziness and no abdominal pain.  His only complaint was that he had some medial forearm numbness on the right arm with his IV site.  He is states that the lateral side is okay but the medial side is "dead".  It was better within ice pack.  Patient denies any nausea or vomiting.  No other concerns or complaints at this time and feels better and understand that he will be going home when his phenobarbital taper is completed.  Objective: Vitals:   06/10/20 0000 06/10/20 0400 06/10/20 0617 06/10/20 1331  BP:    135/78 124/79  Pulse: 72 68 87 72  Resp:   16 20  Temp:   98.5 F (36.9 C) 99.5 F (37.5 C)  TempSrc:   Oral Oral  SpO2:   100% 98%  Weight:      Height:        Intake/Output Summary (Last 24 hours) at 06/10/2020 1418 Last data filed at 06/10/2020 0900 Gross per 24 hour  Intake 1736 ml  Output 3950 ml  Net -2214 ml   Filed Weights   06/04/20 0900 06/06/20 0500 06/08/20 0500  Weight: 67.8 kg 66.3 kg 63.8 kg   Examination: Physical Exam:  Constitutional: WN/WD slightly disheveled Hispanic male currently no acute distress and appears extremely calm Eyes: Lids and conjunctivae normal, sclerae anicteric  ENMT: External Ears, Nose appear normal. Grossly normal hearing; Has some facial acne Neck: Appears normal, supple, no cervical masses, normal ROM, no appreciable thyromegaly; no JVD Respiratory: Diminished to auscultation bilaterally, no wheezing, rales, rhonchi or crackles. Normal respiratory effort and patient is not tachypenic. No accessory muscle use.  Unlabored breathing Cardiovascular: RRR, no murmurs / rubs / gallops. S1 and S2 auscultated.  No appreciable extremity edema Abdomen: Soft, non-tender, non-distended. Bowel sounds positive.  GU: Deferred. Musculoskeletal: No clubbing / cyanosis of digits/nails. No joint deformity upper and lower extremities.   Skin: No rashes, lesions, ulcers on limited skin evaluation. No induration; Warm and dry.  Neurologic: CN 2-12 grossly intact with no focal deficits but he did have some slightly diminished sensation on the right medial forearm. Romberg sign and cerebellar reflexes not assessed.  Psychiatric: Normal judgment and insight. Alert and oriented x 3. Normal mood and appropriate affect.   Data Reviewed: I have personally reviewed following labs and imaging studies  CBC: Recent Labs  Lab 06/04/20 0347 06/05/20 0256 06/06/20 0256 06/07/20 0247 06/08/20 0246 06/09/20 1132 06/10/20 0544  WBC 9.4   < > 8.6 15.2* 8.2 8.6 9.4    NEUTROABS 6.6  --   --  10.2* 4.3 4.1 4.6  HGB 12.9*   < >  13.3 14.4 13.8 13.2 13.4  HCT 37.9*   < > 39.7 43.7 41.5 40.4 40.1  MCV 96.7   < > 98.3 100.0 99.5 100.0 101.0*  PLT 60*   < > 81* 121* 125* 150 183   < > = values in this interval not displayed.   Basic Metabolic Panel: Recent Labs  Lab 06/06/20 0256 06/07/20 0247 06/08/20 0246 06/09/20 1132 06/10/20 0544  NA 135 136 138 136 137  K 3.3* 4.3 3.4* 3.5 3.4*  CL 100 101 102 101 100  CO2 25 25 24 26 29   GLUCOSE 128* 119* 131* 106* 110*  BUN 8 7 8  <5* <5*  CREATININE 0.44* 0.64 0.43* 0.48* 0.55*  CALCIUM 8.7* 9.2 8.9 8.4* 8.7*  MG 1.5* 2.0 1.9 1.6* 1.8  PHOS 3.7 3.2 4.6 3.4 3.0   GFR: Estimated Creatinine Clearance: 114.1 mL/min (A) (by C-G formula based on SCr of 0.55 mg/dL (L)). Liver Function Tests: Recent Labs  Lab 06/06/20 0256 06/07/20 0247 06/08/20 0246 06/09/20 1132 06/10/20 0544  AST 307* 203* 212* 120* 96*  ALT 144* 149* 159* 142* 121*  ALKPHOS 105 103 89 80 85  BILITOT 0.9 1.1 1.0 0.6 0.5  PROT 7.1 7.9 7.2 6.5 6.6  ALBUMIN 3.1* 3.3* 3.2* 2.8* 2.9*   No results for input(s): LIPASE, AMYLASE in the last 168 hours. Recent Labs  Lab 06/03/20 2106  AMMONIA 50*   Coagulation Profile: Recent Labs  Lab 06/04/20 0347  INR 1.2   Cardiac Enzymes: Recent Labs  Lab 06/03/20 1957 06/04/20 0347 06/05/20 0251 06/08/20 0246  CKTOTAL 1,885* 1,844* 764* 203   BNP (last 3 results) No results for input(s): PROBNP in the last 8760 hours. HbA1C: No results for input(s): HGBA1C in the last 72 hours. CBG: No results for input(s): GLUCAP in the last 168 hours. Lipid Profile: No results for input(s): CHOL, HDL, LDLCALC, TRIG, CHOLHDL, LDLDIRECT in the last 72 hours. Thyroid Function Tests: No results for input(s): TSH, T4TOTAL, FREET4, T3FREE, THYROIDAB in the last 72 hours. Anemia Panel: No results for input(s): VITAMINB12, FOLATE, FERRITIN, TIBC, IRON, RETICCTPCT in the last 72 hours. Sepsis  Labs: Recent Labs  Lab 06/03/20 2230 06/04/20 0100  LATICACIDVEN 1.1 1.8    Recent Results (from the past 240 hour(s))  Respiratory Panel by RT PCR (Flu A&B, Covid) -     Status: None   Collection Time: 06/03/20  9:45 PM  Result Value Ref Range Status   SARS Coronavirus 2 by RT PCR NEGATIVE NEGATIVE Final    Comment: (NOTE) SARS-CoV-2 target nucleic acids are NOT DETECTED.  The SARS-CoV-2 RNA is generally detectable in upper respiratoy specimens during the acute phase of infection. The lowest concentration of SARS-CoV-2 viral copies this assay can detect is 131 copies/mL. A negative result does not preclude SARS-Cov-2 infection and should not be used as the sole basis for treatment or other patient management decisions. A negative result may occur with  improper specimen collection/handling, submission of specimen other than nasopharyngeal swab, presence of viral mutation(s) within the areas targeted by this assay, and inadequate number of viral copies (<131 copies/mL). A negative result must be combined with clinical observations, patient history, and epidemiological information. The expected result is Negative.  Fact Sheet for Patients:  13/01/21  Fact Sheet for Healthcare Providers:  06/05/20  This test is no t yet approved or cleared by the https://www.moore.com/ FDA and  has been authorized for detection and/or diagnosis of SARS-CoV-2 by FDA under an Emergency Use  Authorization (EUA). This EUA will remain  in effect (meaning this test can be used) for the duration of the COVID-19 declaration under Section 564(b)(1) of the Act, 21 U.S.C. section 360bbb-3(b)(1), unless the authorization is terminated or revoked sooner.     Influenza A by PCR NEGATIVE NEGATIVE Final   Influenza B by PCR NEGATIVE NEGATIVE Final    Comment: (NOTE) The Xpert Xpress SARS-CoV-2/FLU/RSV assay is intended as an aid in  the diagnosis  of influenza from Nasopharyngeal swab specimens and  should not be used as a sole basis for treatment. Nasal washings and  aspirates are unacceptable for Xpert Xpress SARS-CoV-2/FLU/RSV  testing.  Fact Sheet for Patients: https://www.moore.com/  Fact Sheet for Healthcare Providers: https://www.young.biz/  This test is not yet approved or cleared by the [place] FDA and  has been authorized for detection and/or diagnosis of SARS-CoV-2 by  FDA under an Emergency Use Authorization (EUA). This EUA will remain  in effect (meaning this test can be used) for the duration of the  Covid-19 declaration under Section 564(b)(1) of the Act, 21  U.S.C. section 360bbb-3(b)(1), unless the authorization is  terminated or revoked. Performed at [facility], [address] [place], [place] [address]   MRSA PCR Screening     Status: None   Collection Time: 06/04/20 10:59 PM   Specimen: Nasal Mucosa; Nasopharyngeal  Result Value Ref Range Status   MRSA by PCR NEGATIVE NEGATIVE Final    Comment:        The GeneXpert MRSA Assay (FDA approved for NASAL specimens only), is one component of a comprehensive MRSA colonization surveillance program. It is not intended to diagnose MRSA infection nor to guide or monitor treatment for MRSA infections. Performed at [facility], [address], [place], [place] [address]   Culture, Urine     Status: Abnormal   Collection Time: 06/07/20  8:18 AM   Specimen: Urine, Clean Catch  Result Value Ref Range Status   Specimen Description   Final    URINE, CLEAN CATCH Performed at [facility], [address], [place], [place] [address]    Special Requests   Final    NONE Performed at [facility], [address] [place], [place] [address]    Culture (A)  Final    <10,000 COLONIES/mL INSIGNIFICANT GROWTH Performed at [facility], [address], [place], [place] [address]    Report Status 06/08/2020 FINAL  Final    RN Pressure Injury Documentation:     Estimated body mass index is 20.77 kg/m as calculated from the following:   Height as of this encounter: 5\' 9"  (1.753 m).   Weight as of this encounter: 63.8 kg.  Malnutrition Type:      Malnutrition Characteristics:      Nutrition Interventions:    Radiology Studies: No results found.  Scheduled Meds:  apixaban  5 mg Oral BID   Chlorhexidine Gluconate Cloth  6 each Topical Daily   folic acid  1 mg Oral Daily   lactulose  20 g Oral Daily   mouth rinse  15 mL Mouth Rinse BID   metoprolol  100 mg Oral Daily   multivitamin with minerals  1 tablet Oral Daily   pantoprazole  40 mg Oral Daily   PHENObarbital  130 mg Intravenous TID   Followed by   PHENObarbital  65 mg Intravenous TID   potassium chloride  40 mEq Oral BID  sacubitril-valsartan  1 tablet Oral BID   thiamine  100 mg Oral Daily   Continuous Infusions:   LOS: 7 days   Merlene Laughter, DO [facility] PAGER is on AMION  If 7PM-7AM, please contact night-coverage [email or web address]

## 2020-06-10 NOTE — Plan of Care (Signed)
  Problem: Education: Goal: Knowledge of General Education information will improve Description: Including pain rating scale, medication(s)/side effects and non-pharmacologic comfort measures Outcome: Progressing   Problem: Clinical Measurements: Goal: Ability to maintain clinical measurements within normal limits will improve Outcome: Progressing Goal: Will remain free from infection Outcome: Progressing Goal: Cardiovascular complication will be avoided Outcome: Progressing   Problem: Activity: Goal: Risk for activity intolerance will decrease Outcome: Progressing   Problem: Nutrition: Goal: Adequate nutrition will be maintained Outcome: Progressing   Problem: Coping: Goal: Level of anxiety will decrease Outcome: Progressing   Problem: Pain Managment: Goal: General experience of comfort will improve Outcome: Progressing   Problem: Safety: Goal: Ability to remain free from injury will improve Outcome: Progressing   Problem: Skin Integrity: Goal: Risk for impaired skin integrity will decrease Outcome: Progressing

## 2020-06-11 LAB — COMPREHENSIVE METABOLIC PANEL
ALT: 108 U/L — ABNORMAL HIGH (ref 0–44)
AST: 70 U/L — ABNORMAL HIGH (ref 15–41)
Albumin: 3 g/dL — ABNORMAL LOW (ref 3.5–5.0)
Alkaline Phosphatase: 83 U/L (ref 38–126)
Anion gap: 8 (ref 5–15)
BUN: <5 mg/dL — ABNORMAL LOW (ref 6–20)
CO2: 25 mmol/L (ref 22–32)
Calcium: 9.1 mg/dL (ref 8.9–10.3)
Chloride: 103 mmol/L (ref 98–111)
Creatinine, Ser: 0.47 mg/dL — ABNORMAL LOW (ref 0.61–1.24)
GFR, Estimated: >60 mL/min (ref >60)
Glucose, Bld: 104 mg/dL — ABNORMAL HIGH (ref 70–99)
Potassium: 4.3 mmol/L (ref 3.5–5.1)
Sodium: 136 mmol/L (ref 135–145)
Total Bilirubin: 0.7 mg/dL (ref 0.3–1.2)
Total Protein: 6.9 g/dL (ref 6.5–8.1)

## 2020-06-11 LAB — CBC WITH DIFFERENTIAL/PLATELET
Abs Immature Granulocytes: 0.09 10*3/uL — ABNORMAL HIGH (ref 0.00–0.07)
Basophils Absolute: 0.1 10*3/uL (ref 0.0–0.1)
Basophils Relative: 1 %
Eosinophils Absolute: 0.4 10*3/uL (ref 0.0–0.5)
Eosinophils Relative: 4 %
HCT: 39.7 % (ref 39.0–52.0)
Hemoglobin: 13.1 g/dL (ref 13.0–17.0)
Immature Granulocytes: 1 %
Lymphocytes Relative: 16 %
Lymphs Abs: 1.5 10*3/uL (ref 0.7–4.0)
MCH: 33.4 pg (ref 26.0–34.0)
MCHC: 33 g/dL (ref 30.0–36.0)
MCV: 101.3 fL — ABNORMAL HIGH (ref 80.0–100.0)
Monocytes Absolute: 2.7 10*3/uL — ABNORMAL HIGH (ref 0.1–1.0)
Monocytes Relative: 28 %
Neutro Abs: 4.9 10*3/uL (ref 1.7–7.7)
Neutrophils Relative %: 50 %
Platelets: 186 10*3/uL (ref 150–400)
RBC: 3.92 MIL/uL — ABNORMAL LOW (ref 4.22–5.81)
RDW: 11.7 % (ref 11.5–15.5)
WBC: 9.6 10*3/uL (ref 4.0–10.5)
nRBC: 0 % (ref 0.0–0.2)

## 2020-06-11 LAB — MAGNESIUM: Magnesium: 1.9 mg/dL (ref 1.7–2.4)

## 2020-06-11 LAB — PHOSPHORUS: Phosphorus: 3.4 mg/dL (ref 2.5–4.6)

## 2020-06-11 NOTE — TOC Progression Note (Signed)
Transition of Care [facility]) - Progression Note    Patient Details  Name: Vincent Peters MRN: [id number] Date of Birth: May 06, 1981  [facility] CM/SW Contact  Geni Bers, RN Phone Number: 06/11/2020, 12:27 PM  Clinical Narrative:    Pt lives with his Mother in [place]. Pt is active with [facility]. Last visit 10/03/19.    Expected Discharge Plan: Home/Self Care Barriers to Discharge: Barriers Unresolved (comment) (etoh withdrawal and iv meds)  Expected Discharge Plan and Services Expected Discharge Plan: Home/Self Care In-house Referral: Interpreting Services Discharge Planning Services: CM Consult   Living arrangements for the past 2 months: Single Family Home                                       Social Determinants of Health (SDOH) Interventions    Readmission Risk Interventions No flowsheet data found.

## 2020-06-11 NOTE — Plan of Care (Signed)
  Problem: Education: Goal: Knowledge of General Education information will improve Description: Including pain rating scale, medication(s)/side effects and non-pharmacologic comfort measures Outcome: Progressing   Problem: Clinical Measurements: Goal: Ability to maintain clinical measurements within normal limits will improve Outcome: Progressing Goal: Will remain free from infection Outcome: Progressing Goal: Diagnostic test results will improve Outcome: Progressing   Problem: Activity: Goal: Risk for activity intolerance will decrease Outcome: Progressing   Problem: Nutrition: Goal: Adequate nutrition will be maintained Outcome: Progressing   Problem: Coping: Goal: Level of anxiety will decrease Outcome: Progressing   Problem: Pain Managment: Goal: General experience of comfort will improve Outcome: Progressing   Problem: Safety: Goal: Ability to remain free from injury will improve Outcome: Progressing   Problem: Skin Integrity: Goal: Risk for impaired skin integrity will decrease Outcome: Progressing   

## 2020-06-11 NOTE — Progress Notes (Signed)
PROGRESS NOTE    Vincent Peters  KXF:[id number] DOB: [birth year] DOA: 06/03/2020 PCP: Patient, No Pcp Per   Brief Narrative:  HPI per Dr. Lelon Dike on 06/03/20 Vincent Peters  is a 39 y.o. male, with history of atrial fibrillation, dilated cardiomyopathy with an LVEF of 20 to 30%, hypertension, polysubstance abuse, and more who presents to the ED via EMS.  Chief complaint -someone called EMS because patient was unconscious in the middle of the street.  Patient does not speak [place], translator via iPad was attempted.  Translator repeated questions several times, we tried several different questions, patient was not answering questions.  ED provider reports that he was answering questions for her.  He reports that he typically drinks 10 beers per day.  He had 5 beers this morning before he went to work.  He has not drink anything since which is abnormal for him.  He got shaky and had nausea and vomiting at work.  He was denying hallucinations.  He did admit to a mild headache for her no visual change.  Again patient would answer any of these questions for me with the use of a translator.  Chart review reveals that patient was admitted in February 2021 for alcohol withdrawal as well.  At that time he had tremulousness, restlessness, narrow complex tachycardia as well as A. fib with RVR.  He transiently required a Cardizem drip.  Cardiology did see him during that hospital course, and he followed up with cardiology after the discharge as well.  He had echocardiogram at that time that showed systolic CHF with an EF of 20 to 30%.  Patient was started on Entresto during this hospitalization as well as metoprolol.  He was not started on a statin and that is likely because of his alcoholic hepatitis with transaminitis and hyperammonemia during that stay.  His withdrawal symptoms worsen during his hospitalization and he required the Precedex drip for 3 days.  For this reason  patient will be admitted to progressive care here in case he also requires Precedex during this hospitalization.  **Interim History Patient was still having some withdrawal symptoms but off the Precedex drip now.  Is slowly improving started regressing so as needed Ativan was added back in addition to his scheduled Ativan every 12.  Will consult TOC for substance abuse resources.  Overnight patient spiked a temperature and his WBC worsened.  He has SIRS criteria with no identifiable source of infection currently.  Will hold off on starting antibiotics and patient has refused blood cultures.  06/08/2020: Overnight patient significantly decompensated and had to be placed back on a Precedex drip.  Critical care was consulted for further evaluation recommendations and they started a phenobarbital taper and attempt to wean off the Precedex drip.  He was also placed in violent restraints as he was found trying to stand up and jump out of the bed.  He also had an in and out catheterization due to acute urinary retention.  This morning he is much more awake and calmer his blood pressure is lower due to the Precedex.  He is main complaint now is that he has weakness in his right arm and hand and diminished sensation so will obtain a head CT scan to rule out any hemorrhagic CVA given that he is on Eliquis.  We will continue to monitor and patient IV is in the arm so we will need to reassess his IV sites given that the IVs in his right upper arm.  Will  order a head CT scan. Appreciate critical care evaluation.  06/09/2020: Was calmer today and he had more motion in his right arm and not complaining of any weakness today.  He does complain of pain in his IV site.  He has been weaned off of his Precedex drip now on a phenobarbital taper.  He is more stable today and we will transfer him to the floor.  06/10/2020: Patient was now transferred to medical floor and appears calmer today.  CIWA score this morning was 0.  He  continues to be on a phenobarbital taper and will end on Tuesday.  Anticipating discharge on Tuesday once phenobarbital tapered finishes.  He is complaining of some slight right arm numbness but had an IV infiltrate and feels better with an ice pack.  Electrolytes are being repleted.  He is improved from yesterday.  06/11/2020: Remains stable and will continue his phenobarbital taper today and discharge him tomorrow.  He had no complaints and was wanting to sleep.  Assessment & Plan:   Active Problems:   Delirium tremens (HCC)   SIRS (systemic inflammatory response syndrome) (HCC)  Alcohol withdrawal with Delirium Tremens, improving Acute Metabolic Encephalopathy in the setting of withdrawal and having hallucinations now again -Initially put on Precedex drip and was weaned off and then significantly decompensated again a few days ago and had to be put back on the Precedex.  Precedex was held due to hypotension and has now been weaned off. -Continue CIWA Protocol and have added back as needed lorazepam 0-4 milligrams every as needed for this abdominal withdrawal protocol; his CIWA scores have improved and last few ones were 0 -C/w Folic Acid, Thiamine, and MVI+ Minerals  -UDS was negative on admission.  Has a history of cocaine use -CIWA elevated and now he is having hallucinations and tremors -Patient states that he quit drinking 3 days ago. -Continue lactulose 20 g p.o. daily -Critical care consulted for further evaluation and they recommend a phenobarbital taper and this is in place and he is getting 138 mg 3 times daily and then will be changed to 65 mg 3 times daily for 6 doses and this N Tuesday; PRecedex was held due to Hypotension and have now been discontinued -Follow phenobarb taper per PCCM recs and will discharge the patient home afterwards; the IV taper ends tomorrow and he will be discharged tomorrow afternoon  Rhabdomyolysis -He was found down for an unknown amount of time, CK  around 1800, stable today, continue limited IV fluids 500 cc bolus given heart failure.  Recheck CK  Is now 203 and normalized  -CK Trending Down and will not repeat in the AM  Chronic systolic CHF -With an EF of 20-30%, patient is noncompliant with his home medications, resume here and monitor. -BNP was 114.6 on admission -He appears to be tolerating medications well, his blood pressure is good, continue his home medications with Metoprolol Succinate 100 mg p.o. daily as well as Entresto 49-51 1 tab p.o. twice daily -He again appears euvolemic this morning; He is -5,752 mL since admission  Paroxysmal A. Fib -C/w Telemetry Monitoring  -Continue Metoprolol and blood pressure allows, Eliquis.   Hyponatremia -Due to dehydration, received a limited bolus yesterday and sodium has now normalized -Sodium is now 136  GERD -Continue with Pantoprazole 40 mg p.o. daily  Normocytic/Macrocytic Anemia -Likely due to liver disease, he is macrocytic/normocytic.   -Hgb/Hct stable at 13.1/39.7 -Anemia panel fairly unremarkable as it showed an iron level of 73, U  IBC 223, TIBC 296, saturation ratios of 25%, ferritin level 121, folate level 9.0, vitamin B12 576 -Continue to monitor for signs and symptoms of bleeding; currently no overt bleeding noted  -Repeat CBC in a.m.  Hypertensive Urgency --> Drug Induced Hypotension, improved  -Improved. Was Hypotensive in the setting of Precedex gtt -Continue to monitor blood pressures per protocol and Precedex has been held -Hold metoprolol tartrate as well as Entresto when hisSBP less than 100; now blood pressure is improved and was 124/79  Thromobocytopenia -Patient's platelet count is slowly improving and went from 60,000 and is now 186,000 -Continue to monitor for signs and symptoms of bleeding; currently no overt bleeding noted -Repeat CBC in a.m.  Hyperbilirbuinemia -Improved and likely was reactive -Patient's T Bili went from 1.6 and is now  0.7 -Continue to Monitor and Replete as Necessary -Repeat CMP in the AM   Abnormal LFTs, relatively stable  -Slightly worsening in the setting of Underlying Liver Disease due to EtOH -AST went from 135 -> 144 -> 307 -> 203 -> 212 -> 120 -> 96 -> 70 -ALT went from 55 -> 144 -> 149 -> 159 -> 142 -> 121 -> 108 -Check RUQ U/S and an Acute Hepatitis Panel if not improving or continues to worsen -Continue to Monitor and Trend Hepatic Fxn Panel -Repeat CMP in AM   Hypokalemia -Mild at 4.3 -Magnesium level is now 1.9 -Continue to Monitor and Replete as Necessary -Repeat CMP in the AM  Hypomagnesemia -Patient's Mag Level is 1.9 -Continue to Monitor and Replete as Necessary  -Repeat Mag Level in the AM  SIRS, improved  -No identifiable infection source noted currently and his temperatures resolved along with his leukocytosis -Patient was febrile at 100.6 yesterday morning and now has a new leukocytosis to 15.2 but this is now improved and has fever has resolved and his WBC is now 8.2 -He was tachycardic and tachypneic but his tachycardia has improved but is still slightly tachypneic -Urinalysis is relatively unremarkable and showed small hemoglobin, negative ketones, negative nitrites, negative leukocytes, rare bacteria, 0-5 RBCs per high-power field, 0-5 WBCs -Urine culture still pending -Chest x-ray the day before yesterday morning showed "No acute process in the chest." -Wanted to obtain blood cultures but patient adamantly refused -Patient's leukocytosis is stable and he has not been febrile.  Likely isolated fever  Right arm weakness and diminished strength, improved and resolved  -Obtain a head CT scan without contrast given that he has been on anticoagulation  -He has an IV in the right upper arm and states that his arm also hurts but he has diminished sensation and grip strength. -Blood pressure was also low at this time and will need to continue monitor and if continues to persist  will consider an MRI and discussion with neurology as he has no other focal deficits noted no facial droop or slurred speech -His right arm is improved and only complaint of pain at his IV site -He is complaining of some numbness along the medial aspect of his forearm but this was improved with an ice pack.  He had an IV there yesterday and likely infiltrated  Acute Urinary Retention -He had a bladder scan overnight with 496 mL which was residual  -He had an INO cath ordered as needed -Continue to monitor as needed  DVT prophylaxis: Anticoagulated with apixaban and will continue Code Status: FULL CODE Family Communication: No family present at bedside  Disposition Plan: We will continue his phenobarbital taper and discharge  him home once this is complete  Status is: Inpatient  Remains inpatient appropriate because:Unsafe d/c plan, IV treatments appropriate due to intensity of illness or inability to take PO and Inpatient level of care appropriate due to severity of illness   Dispo: The patient is from: Home              Anticipated d/c is to: Home              Anticipated d/c date is:1-2 days; anticipated discharge date is 06/12/2020              Patient currently is not medically stable to d/c.  Consultants:   Pulmonary critical care   Procedures: None  Antimicrobials: Anti-infectives (From admission, onward)   None       Subjective: Seen and examined at bedside today he spoke to me in [place] and he felt he is doing good.  Is wanting to sleep.  Denies any chest pain, lightheadedness or dizziness.  His right arm complaints have now resolved.  Feels well and is going to be discharged tomorrow but asking for transportation to go back.  No other concerns or complaints at this time.  Objective: Vitals:   06/11/20 0000 06/11/20 0400 06/11/20 0500 06/11/20 0657  BP:    (!) 136/95  Pulse: 72 68  84  Resp:    18  Temp:    98.4 F (36.9 C)  TempSrc:    Oral  SpO2:    100%   Weight:   64.7 kg   Height:        Intake/Output Summary (Last 24 hours) at 06/11/2020 1137 Last data filed at 06/11/2020 0109 Gross per 24 hour  Intake 1140 ml  Output 3800 ml  Net -2660 ml   Filed Weights   06/06/20 0500 06/08/20 0500 06/11/20 0500  Weight: 66.3 kg 63.8 kg 64.7 kg   Examination: Physical Exam:  Constitutional: WN/WD disheveled Hispanic male currently in no acute distress appears calm and resting Eyes: Lids and conjunctivae normal, sclerae anicteric  ENMT: External Ears, Nose appear normal. Grossly normal hearing. Neck: Appears normal, supple, no cervical masses, normal ROM, no appreciable thyromegaly; no JVD Respiratory: Diminished to auscultation bilaterally, no wheezing, rales, rhonchi or crackles. Normal respiratory effort and patient is not tachypenic. No accessory muscle use.  Unlabored breathing Cardiovascular: RRR, no murmurs / rubs / gallops. S1 and S2 auscultated. No extremity edema. Abdomen: Soft, non-tender, non-distended. Bowel sounds positive.  GU: Deferred. Musculoskeletal: No clubbing / cyanosis of digits/nails. No joint deformity upper and lower extremities.  Skin: No rashes, lesions, ulcers on limited skin evaluation. No induration; Warm and dry.  Neurologic: CN 2-12 grossly intact with no focal deficits. Romberg sign and cerebellar reflexes not assessed.  Psychiatric: Normal judgment and insight. Alert and oriented x 3. Normal mood and appropriate affect.   Data Reviewed: I have personally reviewed following labs and imaging studies  CBC: Recent Labs  Lab 06/07/20 0247 06/08/20 0246 06/09/20 1132 06/10/20 0544 06/11/20 0435  WBC 15.2* 8.2 8.6 9.4 9.6  NEUTROABS 10.2* 4.3 4.1 4.6 4.9  HGB 14.4 13.8 13.2 13.4 13.1  HCT 43.7 41.5 40.4 40.1 39.7  MCV 100.0 99.5 100.0 101.0* 101.3*  PLT 121* 125* 150 183 186   Basic Metabolic Panel: Recent Labs  Lab 06/07/20 0247 06/08/20 0246 06/09/20 1132 06/10/20 0544 06/11/20 0435  NA 136 138  136 137 136  K 4.3 3.4* 3.5 3.4* 4.3  CL 101 102 101 100 103  CO2 25 24 26 29 25   GLUCOSE 119* 131* 106* 110* 104*  BUN 7 8 <5* <5* <5*  CREATININE 0.64 0.43* 0.48* 0.55* 0.47*  CALCIUM 9.2 8.9 8.4* 8.7* 9.1  MG 2.0 1.9 1.6* 1.8 1.9  PHOS 3.2 4.6 3.4 3.0 3.4   GFR: Estimated Creatinine Clearance: 115.7 mL/min (A) (by C-G formula based on SCr of 0.47 mg/dL (L)). Liver Function Tests: Recent Labs  Lab 06/07/20 0247 06/08/20 0246 06/09/20 1132 06/10/20 0544 06/11/20 0435  AST 203* 212* 120* 96* 70*  ALT 149* 159* 142* 121* 108*  ALKPHOS 103 89 80 85 83  BILITOT 1.1 1.0 0.6 0.5 0.7  PROT 7.9 7.2 6.5 6.6 6.9  ALBUMIN 3.3* 3.2* 2.8* 2.9* 3.0*   No results for input(s): LIPASE, AMYLASE in the last 168 hours. No results for input(s): AMMONIA in the last 168 hours. Coagulation Profile: No results for input(s): INR, PROTIME in the last 168 hours. Cardiac Enzymes: Recent Labs  Lab 06/05/20 0251 06/08/20 0246  CKTOTAL 764* 203   BNP (last 3 results) No results for input(s): PROBNP in the last 8760 hours. HbA1C: No results for input(s): HGBA1C in the last 72 hours. CBG: No results for input(s): GLUCAP in the last 168 hours. Lipid Profile: No results for input(s): CHOL, HDL, LDLCALC, TRIG, CHOLHDL, LDLDIRECT in the last 72 hours. Thyroid Function Tests: No results for input(s): TSH, T4TOTAL, FREET4, T3FREE, THYROIDAB in the last 72 hours. Anemia Panel: No results for input(s): VITAMINB12, FOLATE, FERRITIN, TIBC, IRON, RETICCTPCT in the last 72 hours. Sepsis Labs: No results for input(s): PROCALCITON, LATICACIDVEN in the last 168 hours.  Recent Results (from the past 240 hour(s))  Respiratory Panel by RT PCR (Flu A&B, Covid) -     Status: None   Collection Time: 06/03/20  9:45 PM  Result Value Ref Range Status   SARS Coronavirus 2 by RT PCR NEGATIVE NEGATIVE Final    Comment: (NOTE) SARS-CoV-2 target nucleic acids are NOT DETECTED.  The SARS-CoV-2 RNA is generally  detectable in upper respiratoy specimens during the acute phase of infection. The lowest concentration of SARS-CoV-2 viral copies this assay can detect is 131 copies/mL. A negative result does not preclude SARS-Cov-2 infection and should not be used as the sole basis for treatment or other patient management decisions. A negative result may occur with  improper specimen collection/handling, submission of specimen other than nasopharyngeal swab, presence of viral mutation(s) within the areas targeted by this assay, and inadequate number of viral copies (<131 copies/mL). A negative result must be combined with clinical observations, patient history, and epidemiological information. The expected result is Negative.  Fact Sheet for Patients:  06/05/20  Fact Sheet for Healthcare Providers:  https://www.moore.com/  This test is no t yet approved or cleared by the https://www.young.biz/ FDA and  has been authorized for detection and/or diagnosis of SARS-CoV-2 by FDA under an Emergency Use Authorization (EUA). This EUA will remain  in effect (meaning this test can be used) for the duration of the COVID-19 declaration under Section 564(b)(1) of the Act, 21 U.S.C. section 360bbb-3(b)(1), unless the authorization is terminated or revoked sooner.     Influenza A by PCR NEGATIVE NEGATIVE Final   Influenza B by PCR NEGATIVE NEGATIVE Final    Comment: (NOTE) The Xpert Xpress SARS-CoV-2/FLU/RSV assay is intended as an aid in  the diagnosis of influenza from Nasopharyngeal swab specimens and  should not be used as a sole basis for treatment. Nasal washings and  aspirates are  unacceptable for Xpert Xpress SARS-CoV-2/FLU/RSV  testing.  Fact Sheet for Patients: https://www.moore.com/https://www.fda.gov/media/142436/download  Fact Sheet for Healthcare Providers: https://www.young.biz/https://www.fda.gov/media/142435/download  This test is not yet approved or cleared by the [place] FDA and   has been authorized for detection and/or diagnosis of SARS-CoV-2 by  FDA under an Emergency Use Authorization (EUA). This EUA will remain  in effect (meaning this test can be used) for the duration of the  Covid-19 declaration under Section 564(b)(1) of the Act, 21  U.S.C. section 360bbb-3(b)(1), unless the authorization is  terminated or revoked. Performed at [facility], [address] [place], [place] [address]   MRSA PCR Screening     Status: None   Collection Time: 06/04/20 10:59 PM   Specimen: Nasal Mucosa; Nasopharyngeal  Result Value Ref Range Status   MRSA by PCR NEGATIVE NEGATIVE Final    Comment:        The GeneXpert MRSA Assay (FDA approved for NASAL specimens only), is one component of a comprehensive MRSA colonization surveillance program. It is not intended to diagnose MRSA infection nor to guide or monitor treatment for MRSA infections. Performed at [facility], [address] [place], [place] [address]   Culture, Urine     Status: Abnormal   Collection Time: 06/07/20  8:18 AM   Specimen: Urine, Clean Catch  Result Value Ref Range Status   Specimen Description   Final    URINE, CLEAN CATCH Performed at [facility], [address] [place], [place] [address]    Special Requests   Final    NONE Performed at [facility], [address] [place], [place] [address]    Culture (A)  Final    <10,000 COLONIES/mL INSIGNIFICANT GROWTH Performed at [facility], [address] [place], [place] [address]    Report Status 06/08/2020 FINAL  Final    RN Pressure Injury Documentation:     Estimated body mass index is 21.06 kg/m as calculated from the following:   Height as of this encounter: 5\' 9"  (1.753 m).   Weight as of this encounter: 64.7 kg.  Malnutrition Type:      Malnutrition Characteristics:      Nutrition Interventions:    Radiology Studies: No  results found.  Scheduled Meds: . apixaban  5 mg Oral BID  . Chlorhexidine Gluconate Cloth  6 each Topical Daily  . folic acid  1 mg Oral Daily  . lactulose  20 g Oral Daily  . mouth rinse  15 mL Mouth Rinse BID  . metoprolol  100 mg Oral Daily  . multivitamin with minerals  1 tablet Oral Daily  . pantoprazole  40 mg Oral Daily  . PHENObarbital  65 mg Intravenous TID  . sacubitril-valsartan  1 tablet Oral BID  . thiamine  100 mg Oral Daily   Continuous Infusions:   LOS: 8 days   Merlene Laughtermair Latif Kemoni Quesenberry, DO [facility] PAGER is on AMION  If 7PM-7AM, please contact night-coverage [email or web address]

## 2020-06-12 DIAGNOSIS — K219 Gastro-esophageal reflux disease without esophagitis: Secondary | ICD-10-CM

## 2020-06-12 DIAGNOSIS — I420 Dilated cardiomyopathy: Secondary | ICD-10-CM

## 2020-06-12 DIAGNOSIS — E871 Hypo-osmolality and hyponatremia: Secondary | ICD-10-CM

## 2020-06-12 DIAGNOSIS — R7401 Elevation of levels of liver transaminase levels: Secondary | ICD-10-CM

## 2020-06-12 DIAGNOSIS — M6282 Rhabdomyolysis: Secondary | ICD-10-CM

## 2020-06-12 DIAGNOSIS — I5022 Chronic systolic (congestive) heart failure: Secondary | ICD-10-CM

## 2020-06-12 DIAGNOSIS — I160 Hypertensive urgency: Secondary | ICD-10-CM

## 2020-06-12 DIAGNOSIS — D696 Thrombocytopenia, unspecified: Secondary | ICD-10-CM

## 2020-06-12 DIAGNOSIS — I480 Paroxysmal atrial fibrillation: Secondary | ICD-10-CM

## 2020-06-12 LAB — MAGNESIUM: Magnesium: 1.8 mg/dL (ref 1.7–2.4)

## 2020-06-12 LAB — CBC WITH DIFFERENTIAL/PLATELET
Abs Immature Granulocytes: 0.2 10*3/uL — ABNORMAL HIGH (ref 0.00–0.07)
Basophils Absolute: 0.2 10*3/uL — ABNORMAL HIGH (ref 0.0–0.1)
Basophils Relative: 1 %
Eosinophils Absolute: 0.4 10*3/uL (ref 0.0–0.5)
Eosinophils Relative: 3 %
HCT: 40.8 % (ref 39.0–52.0)
Hemoglobin: 13.4 g/dL (ref 13.0–17.0)
Immature Granulocytes: 2 %
Lymphocytes Relative: 18 %
Lymphs Abs: 2.2 10*3/uL (ref 0.7–4.0)
MCH: 32.9 pg (ref 26.0–34.0)
MCHC: 32.8 g/dL (ref 30.0–36.0)
MCV: 100.2 fL — ABNORMAL HIGH (ref 80.0–100.0)
Monocytes Absolute: 2.6 10*3/uL — ABNORMAL HIGH (ref 0.1–1.0)
Monocytes Relative: 22 %
Neutro Abs: 6.5 10*3/uL (ref 1.7–7.7)
Neutrophils Relative %: 54 %
Platelets: 264 10*3/uL (ref 150–400)
RBC: 4.07 MIL/uL — ABNORMAL LOW (ref 4.22–5.81)
RDW: 11.6 % (ref 11.5–15.5)
WBC: 12 10*3/uL — ABNORMAL HIGH (ref 4.0–10.5)
nRBC: 0 % (ref 0.0–0.2)

## 2020-06-12 LAB — COMPREHENSIVE METABOLIC PANEL
ALT: 102 U/L — ABNORMAL HIGH (ref 0–44)
AST: 78 U/L — ABNORMAL HIGH (ref 15–41)
Albumin: 3.1 g/dL — ABNORMAL LOW (ref 3.5–5.0)
Alkaline Phosphatase: 76 U/L (ref 38–126)
Anion gap: 9 (ref 5–15)
BUN: 7 mg/dL (ref 6–20)
CO2: 25 mmol/L (ref 22–32)
Calcium: 9.1 mg/dL (ref 8.9–10.3)
Chloride: 101 mmol/L (ref 98–111)
Creatinine, Ser: 0.63 mg/dL (ref 0.61–1.24)
GFR, Estimated: >60 mL/min (ref >60)
Glucose, Bld: 99 mg/dL (ref 70–99)
Potassium: 3.7 mmol/L (ref 3.5–5.1)
Sodium: 135 mmol/L (ref 135–145)
Total Bilirubin: 0.6 mg/dL (ref 0.3–1.2)
Total Protein: 7.1 g/dL (ref 6.5–8.1)

## 2020-06-12 LAB — PHOSPHORUS: Phosphorus: 4.8 mg/dL — ABNORMAL HIGH (ref 2.5–4.6)

## 2020-06-12 MED ORDER — SACUBITRIL-VALSARTAN 49-51 MG PO TABS: 1.0000 | ORAL_TABLET | Freq: Two times a day (BID) | ORAL | 1 refills | Status: AC

## 2020-06-12 MED ORDER — PANTOPRAZOLE SODIUM 40 MG PO TBEC: 40.0000 mg | DELAYED_RELEASE_TABLET | Freq: Every day | ORAL | 2 refills | Status: AC

## 2020-06-12 MED ORDER — FOLIC ACID 1 MG PO TABS: 1.0000 mg | ORAL_TABLET | Freq: Every day | ORAL | Status: AC

## 2020-06-12 MED ORDER — LACTULOSE 10 GM/15ML PO SOLN: 20.0000 g | Freq: Every day | ORAL | 1 refills | Status: AC

## 2020-06-12 MED ORDER — METOPROLOL SUCCINATE ER 100 MG PO TB24: 100.0000 mg | ORAL_TABLET | Freq: Every day | ORAL | 1 refills | Status: AC

## 2020-06-12 MED ORDER — THIAMINE HCL 100 MG PO TABS: 100.0000 mg | ORAL_TABLET | Freq: Every day | ORAL | Status: AC

## 2020-06-12 MED ORDER — APIXABAN 5 MG PO TABS: 5.0000 mg | ORAL_TABLET | Freq: Two times a day (BID) | ORAL | 2 refills | Status: AC

## 2020-06-12 NOTE — Progress Notes (Signed)
Pt. Is stable and resting. Assessment completed and findings are agreeable with prior RN.

## 2020-06-12 NOTE — Plan of Care (Signed)

## 2020-06-12 NOTE — Discharge Summary (Signed)
Physician Discharge Summary  Vincent Peters NWG:[id number] DOB: [birth year] DOA: 06/03/2020  PCP: Patient, No Pcp Per  Admit date: 06/03/2020 Discharge date: 06/12/2020  Time spent: 60 minutes  Recommendations for Outpatient Follow-up:  1. Follow-up with PCP, community health and wellness in 2 weeks.  On follow-up patient will need a CMET, CBC, Mag done to follow-up on electrolytes, LFTs, renal function, hemoglobin, magnesium levels.  Patient may need referral back to cardiology for follow-up on cardiac issues.   Discharge Diagnoses:  Active Problems:   Delirium tremens (HCC)   SIRS (systemic inflammatory response syndrome) (HCC)   Chronic systolic CHF (congestive heart failure) (HCC)   AF (paroxysmal atrial fibrillation) (HCC)   Gastroesophageal reflux disease   Hyponatremia   Non-traumatic rhabdomyolysis   Transaminitis   Hypertensive urgency   Discharge Condition: Stable and improved  Diet recommendation: Heart healthy  Filed Weights   06/08/20 0500 06/11/20 0500 06/12/20 0654  Weight: 63.8 kg 64.7 kg 63.7 kg    History of present illness:  HPI per Dr. Dorthula Perfect- Antonietta Barcelona  is a 39 y.o. male, with history of atrial fibrillation, dilated cardiomyopathy with an LVEF of 20 to 30%, hypertension, polysubstance abuse, and more who presents to the ED via EMS.  Chief complaint -someone called EMS because patient was unconscious in the middle of the street.  Patient does not speak [place], translator via iPad was attempted.  Translator repeated questions several times, we tried several different questions, patient was not answering questions.  ED provider reports that he was answering questions for her.  He reports that he typically drinks 10 beers per day.  He had 5 beers this morning before he went to work.  He has not drink anything since which is abnormal for him.  He got shaky and had nausea and vomiting at work.  He was denying hallucinations.   He did admit to a mild headache for her no visual change.  Again patient would answer any of these questions for me with the use of a translator.  Chart review reveals that patient was admitted in February 2021 for alcohol withdrawal as well.  At that time he had tremulousness, restlessness, narrow complex tachycardia as well as A. fib with RVR.  He transiently required a Cardizem drip.  Cardiology did see him during that hospital course, and he followed up with cardiology after the discharge as well.  He had echocardiogram at that time that showed systolic CHF with an EF of 20 to 30%.  Patient was started on Entresto during this hospitalization as well as metoprolol.  He was not started on a statin and that is likely because of his alcoholic hepatitis with transaminitis and hyperammonemia during that stay.  His withdrawal symptoms worsen during his hospitalization and he required the Precedex drip for 3 days.  For this reason patient will be admitted to progressive care here in case he also requires Precedex during this hospitalization.  In the ED Temperature nine 9.5, heart rate 111, respiratory rate 20, blood pressure 151/92, maintaining oxygen saturations on room air. Leukocytosis at 13.5, thrombocytopenia at 82 CHEM panel revealed a low bicarb at 17, gap of 25 CPK was 1885 UDS negative Alcohol level not detectable Ammonia 50 EKG showed a heart rate of 115, sinus tach, QTC 460, possible ischemia in the lateral leads Patient was started on [place] protocol Was given a banana bag Zofran also given Admission requested for management of DTs  Hospital Course:  #1 alcohol withdrawal with DTs/acute metabolic  encephalopathy in the setting of alcohol withdrawal Patient initially noted to be in alcohol withdrawal with DTs on admission.  Initially was placed on a Precedex drip subsequently weaned off and decompensated few days later and had to be placed back on the Precedex drip.  Precedex drip  initially held due to hypotension and subsequently weaned off.  Patient also maintained on the Ativan withdrawal protocol, thiamine, folic acid, multivitamin.  UDS done on admission was negative.  Patient with prior history of cocaine use.  Ativan withdrawal protocol adjusted as patient was noted to have more tremors and hallucinations.  Patient also maintained on lactulose 20 mg daily.  PCCM was consulted during the hospitalization as patient had to be placed back on a Precedex drip and patient started on a phenobarbital taper.  Patient improved clinically.  Phenobarbital taper completed on day of discharge.  Patient had no tremors, no hallucinations, and was back to baseline by day of discharge.  Alcohol cessation stressed to patient.  Outpatient follow-up with PCP.  2.  Rhabdomyolysis Secondary to being found down with undisclosed amount of time.  CK on admission was around 1800 and improved during the hospitalization with hydration down to 203.  Outpatient follow-up.  3.  Chronic systolic CHF/dilated cardiomyopathy To have a prior 2D echo with EF of 20 to 30%, patient noted to be noncompliant with home medications which were resumed during the hospitalization.  Patient was placed back on Toprol-XL 100 mg daily as well as Entresto twice daily.  Patient also placed back on home regimen Eliquis.  Patient was euvolemic during the hospitalization.  Outpatient follow-up with PCP and cardiology.  4.  Paroxysmal atrial fibrillation Remain rate controlled on Toprol-XL.  Eliquis resumed for anticoagulation.  Outpatient follow-up.  5.  Hyponatremia Felt secondary to hypovolemic hyponatremia.  Patient received a fluid bolus.  Hyponatremia had resolved by day of discharge.  6.  Gastroesophageal reflux disease Patient maintained on a PPI.  7.  Normocytic/microcytic anemia Secondary to ongoing alcohol use.  Anemia panel which was done had a iron level of 73, TIBC of 296, ferritin of 121, folate of 9, vitamin  B12 of 576.  Hemoglobin remained stable.  Patient had no signs or symptoms of bleeding.  Outpatient follow-up.  8.  Hypertensive urgency>>>> drug-induced hypotension Improved.  Patient noted to be hypotensive in the setting of Precedex drip.  Precedex drip was subsequently held and subsequently weaned off.  Patient's Toprol-XL and Entresto were held while systolic blood pressure was < 387.  As systolic blood pressure improved patient was resumed back on Toprol-XL and Entresto.  Blood pressure controlled by day of discharge.  Outpatient follow-up with PCP.  9.  Thrombocytopenia Likely secondary to ongoing alcohol use.  Improved during the hospitalization.  No signs of bleeding.  Outpatient follow-up.  10.  Transaminitis/hyperbilirubinemia Felt secondary to ongoing alcohol use and possible underlying liver disease.  LFTs improved during the hospitalization.  Outpatient follow-up.  11.  Hypokalemia Resolved by day of discharge.  12.  Hypomagnesemia Repleted.  13.  SIRS ruled out During the hospitalization was concern for SIRS with a leukocytosis and low-grade temperature.  Leukocytosis improved.  Patient remained afebrile.  Urinalysis which was done was relatively unremarkable.  Patient remained asymptomatic.  Chest x-ray done was negative for any infiltrate.  14.  Right arm weakness and diminished strength, improved and resolved Patient with some complaint of right arm weakness and diminished strength.  Patient noted to have an IV in the right upper arm with  some concern for possible infiltration..  Patient at that time was noted to have a low blood pressure.  Patient had no further neurological deficits.  Right arm pain improved as well as strength.  Resolved by day of discharge.  15.  Acute urinary retention Resolved.  Procedures:  CT head 06/08/2020  Chest x-ray 06/07/2020    Consultations:  Pccm: Dr. Levon Hedger 06/07/2020  Discharge Exam: Vitals:   06/12/20 0654 06/12/20 1319   BP: 115/77 (!) 137/95  Pulse: 71 78  Resp: 18 11  Temp: 98.3 F (36.8 C) 98.5 F (36.9 C)  SpO2: 100% 100%    General: NAD Cardiovascular: RRR Respiratory: CTAB  Discharge Instructions   Discharge Instructions    Diet - low sodium heart healthy   Complete by: As directed    Increase activity slowly   Complete by: As directed      Allergies as of 06/12/2020   No Known Allergies     Medication List    STOP taking these medications   ondansetron 4 MG tablet Commonly known as: ZOFRAN     TAKE these medications   apixaban 5 MG Tabs tablet Commonly known as: ELIQUIS Take 1 tablet (5 mg total) by mouth 2 (two) times daily.   folic acid 1 MG tablet Commonly known as: FOLVITE Take 1 tablet (1 mg total) by mouth daily. Start taking on: June 13, 2020   lactulose 10 GM/15ML solution Commonly known as: CHRONULAC Take 30 mLs (20 g total) by mouth daily.   metoprolol succinate 100 MG 24 hr tablet Commonly known as: TOPROL-XL Take 1 tablet (100 mg total) by mouth daily. Take with or immediately following a meal. Start taking on: June 13, 2020 What changed:   medication strength  how much to take   multivitamin with minerals Tabs tablet Take 1 tablet by mouth daily.   pantoprazole 40 MG tablet Commonly known as: PROTONIX Take 1 tablet (40 mg total) by mouth daily.   sacubitril-valsartan 49-51 MG Commonly known as: ENTRESTO Take 1 tablet by mouth 2 (two) times daily.   thiamine 100 MG tablet Take 1 tablet (100 mg total) by mouth daily. Start taking on: June 13, 2020      No Known Allergies  Follow-up Information    [facility]. Schedule an appointment as soon as possible for a visit in 2 week(s).   Why: PLEASE CALL TO MAKE AN APPOINTMENT. Contact information: [address] [place] [address] [phone]               The results of significant diagnostics from this  hospitalization (including imaging, microbiology, ancillary and laboratory) are listed below for reference.    Significant Diagnostic Studies: CT HEAD WO CONTRAST  Result Date: 06/08/2020 CLINICAL DATA:  Neuro deficit. Suspected acute stroke. EXAM: CT HEAD WITHOUT CONTRAST TECHNIQUE: Contiguous axial images were obtained from the base of the skull through the vertex without intravenous contrast. COMPARISON:  07/06/2019 FINDINGS: Brain: Stable mildly enlarged ventricles and cortical sulci. Stable minimal patchy white matter low density in both cerebral hemispheres. No intracranial hemorrhage, mass lesion or CT evidence of acute infarction. Vascular: No hyperdense vessel or unexpected calcification. Skull: Normal. Negative for fracture or focal lesion. Sinuses/Orbits: Unremarkable. Other: None. IMPRESSION: 1. No acute abnormality. 2. Stable mild diffuse cerebral and cerebellar atrophy. 3. Stable minimal chronic small vessel white matter ischemic changes in both cerebral hemispheres. Electronically Signed   By: Zada Finders.D.  On: 06/08/2020 10:27   DG CHEST PORT 1 VIEW  Result Date: 06/07/2020 CLINICAL DATA:  Fever EXAM: PORTABLE CHEST 1 VIEW COMPARISON:  09/23/2019 FINDINGS: Low lung volumes. No consolidation or edema. No pleural effusion or pneumothorax. Cardiomediastinal contours are within normal limits. IMPRESSION: No acute process in the chest. Electronically Signed   By: Guadlupe Spanish M.D.   On: 06/07/2020 08:10    Microbiology: Recent Results (from the past 240 hour(s))  Respiratory Panel by RT PCR (Flu A&B, Covid) -     Status: None   Collection Time: 06/03/20  9:45 PM  Result Value Ref Range Status   SARS Coronavirus 2 by RT PCR NEGATIVE NEGATIVE Final    Comment: (NOTE) SARS-CoV-2 target nucleic acids are NOT DETECTED.  The SARS-CoV-2 RNA is generally detectable in upper respiratoy specimens during the acute phase of infection. The lowest concentration of SARS-CoV-2 viral copies  this assay can detect is 131 copies/mL. A negative result does not preclude SARS-Cov-2 infection and should not be used as the sole basis for treatment or other patient management decisions. A negative result may occur with  improper specimen collection/handling, submission of specimen other than nasopharyngeal swab, presence of viral mutation(s) within the areas targeted by this assay, and inadequate number of viral copies (<131 copies/mL). A negative result must be combined with clinical observations, patient history, and epidemiological information. The expected result is Negative.  Fact Sheet for Patients:  https://www.moore.com/  Fact Sheet for Healthcare Providers:  https://www.young.biz/  This test is no t yet approved or cleared by the [place] FDA and  has been authorized for detection and/or diagnosis of SARS-CoV-2 by FDA under an Emergency Use Authorization (EUA). This EUA will remain  in effect (meaning this test can be used) for the duration of the COVID-19 declaration under Section 564(b)(1) of the Act, 21 U.S.C. section 360bbb-3(b)(1), unless the authorization is terminated or revoked sooner.     Influenza A by PCR NEGATIVE NEGATIVE Final   Influenza B by PCR NEGATIVE NEGATIVE Final    Comment: (NOTE) The Xpert Xpress SARS-CoV-2/FLU/RSV assay is intended as an aid in  the diagnosis of influenza from Nasopharyngeal swab specimens and  should not be used as a sole basis for treatment. Nasal washings and  aspirates are unacceptable for Xpert Xpress SARS-CoV-2/FLU/RSV  testing.  Fact Sheet for Patients: https://www.moore.com/  Fact Sheet for Healthcare Providers: https://www.young.biz/  This test is not yet approved or cleared by the [place] FDA and  has been authorized for detection and/or diagnosis of SARS-CoV-2 by  FDA under an Emergency Use Authorization (EUA). This EUA  will remain  in effect (meaning this test can be used) for the duration of the  Covid-19 declaration under Section 564(b)(1) of the Act, 21  U.S.C. section 360bbb-3(b)(1), unless the authorization is  terminated or revoked. Performed at [facility], [address], [place], [place] [address]   MRSA PCR Screening     Status: None   Collection Time: 06/04/20 10:59 PM   Specimen: Nasal Mucosa; Nasopharyngeal  Result Value Ref Range Status   MRSA by PCR NEGATIVE NEGATIVE Final    Comment:        The GeneXpert MRSA Assay (FDA approved for NASAL specimens only), is one component of a comprehensive MRSA colonization surveillance program. It is not intended to diagnose MRSA infection nor to guide or monitor treatment for MRSA infections. Performed at [facility], [address], [place], [place] [address]  Culture, Urine     Status: Abnormal   Collection Time: 06/07/20  8:18 AM   Specimen: Urine, Clean Catch  Result Value Ref Range Status   Specimen Description   Final    URINE, CLEAN CATCH Performed at [facility], [address] [place], [place] [address]    Special Requests   Final    NONE Performed at [facility], [address] [place], [place] [address]    Culture (A)  Final    <10,000 COLONIES/mL INSIGNIFICANT GROWTH Performed at [facility], [address] [place], [place] [address]    Report Status 06/08/2020 FINAL  Final     Labs: Basic Metabolic Panel: Recent Labs  Lab 06/08/20 0246 06/09/20 1132 06/10/20 0544 06/11/20 0435 06/12/20 0506  NA 138 136 137 136 135  K 3.4* 3.5 3.4* 4.3 3.7  CL 102 101 100 103 101  CO2 24 26 29 25 25   GLUCOSE 131* 106* 110* 104* 99  BUN 8 <5* <5* <5* 7  CREATININE 0.43* 0.48* 0.55* 0.47* 0.63  CALCIUM 8.9 8.4* 8.7* 9.1 9.1  MG 1.9 1.6* 1.8 1.9 1.8  PHOS 4.6 3.4 3.0 3.4 4.8*   Liver Function Tests: Recent Labs  Lab 06/08/20 0246  06/09/20 1132 06/10/20 0544 06/11/20 0435 06/12/20 0506  AST 212* 120* 96* 70* 78*  ALT 159* 142* 121* 108* 102*  ALKPHOS 89 80 85 83 76  BILITOT 1.0 0.6 0.5 0.7 0.6  PROT 7.2 6.5 6.6 6.9 7.1  ALBUMIN 3.2* 2.8* 2.9* 3.0* 3.1*   No results for input(s): LIPASE, AMYLASE in the last 168 hours. No results for input(s): AMMONIA in the last 168 hours. CBC: Recent Labs  Lab 06/08/20 0246 06/09/20 1132 06/10/20 0544 06/11/20 0435 06/12/20 0506  WBC 8.2 8.6 9.4 9.6 12.0*  NEUTROABS 4.3 4.1 4.6 4.9 6.5  HGB 13.8 13.2 13.4 13.1 13.4  HCT 41.5 40.4 40.1 39.7 40.8  MCV 99.5 100.0 101.0* 101.3* 100.2*  PLT 125* 150 183 186 264   Cardiac Enzymes: Recent Labs  Lab 06/08/20 0246  CKTOTAL 203   BNP: BNP (last 3 results) Recent Labs    06/05/20 0256  BNP 114.6*    ProBNP (last 3 results) No results for input(s): PROBNP in the last 8760 hours.  CBG: No results for input(s): GLUCAP in the last 168 hours.     Signed:  Ramiro Harvestaniel Eleanor Dimichele MD.  [facility] 06/12/2020, 4:00 PM

## 2020-11-08 IMAGING — US US ABDOMEN LIMITED
1 series · 14 of 25 positions shown · non-contrast
Comparison: None.

CLINICAL DATA: Abnormal liver function tests.

EXAM:
ULTRASOUND ABDOMEN LIMITED RIGHT UPPER QUADRANT

[Series 1: us abdomen limited · 14 of 147 slices shown]
[im 1/147]
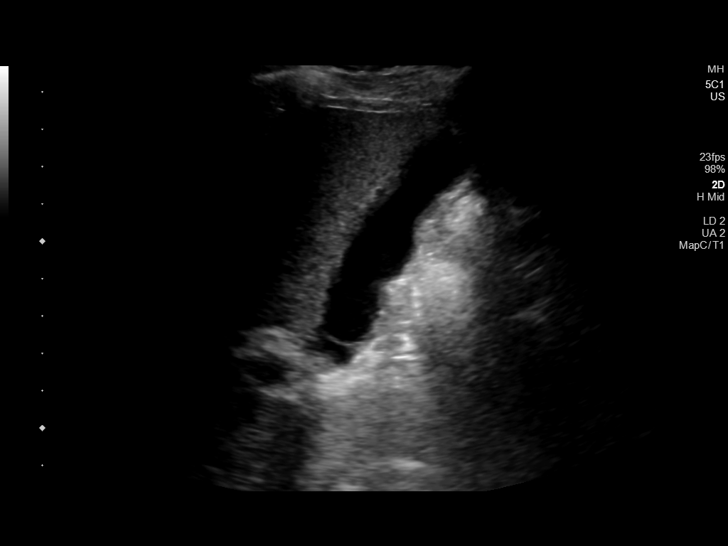
[im 13/147]
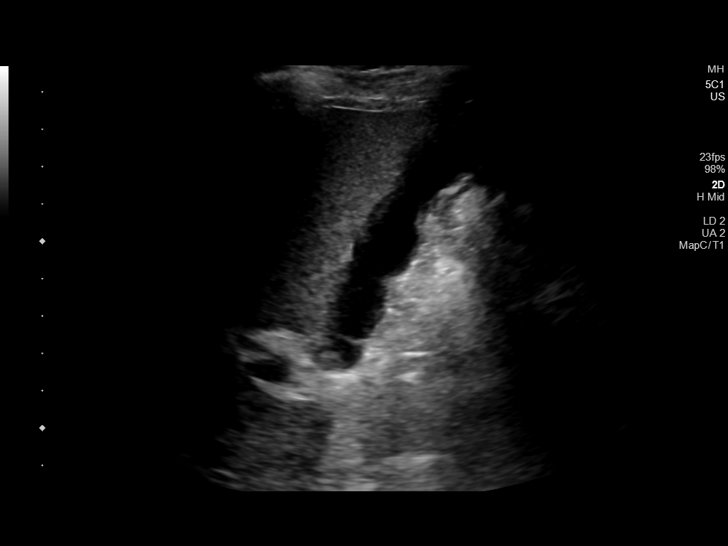
[im 25/147]
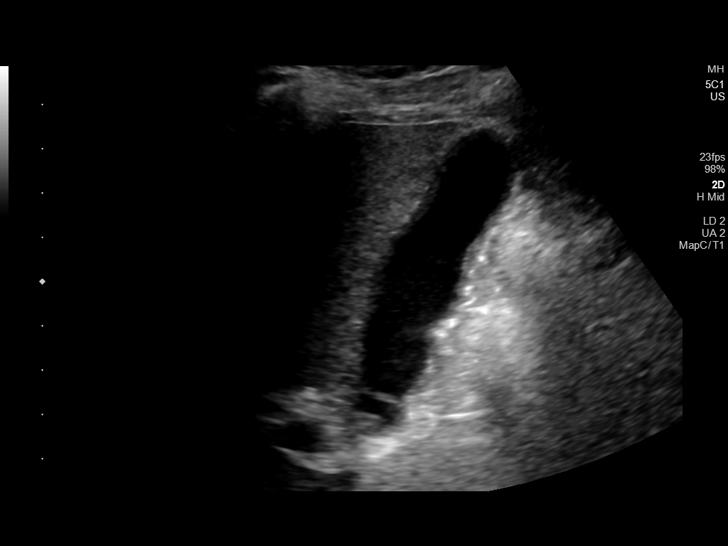
[im 37/147]
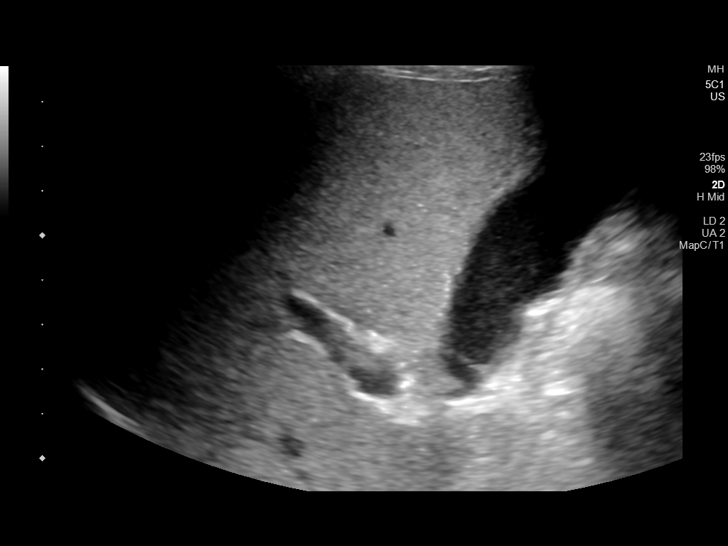
[im 49/147]
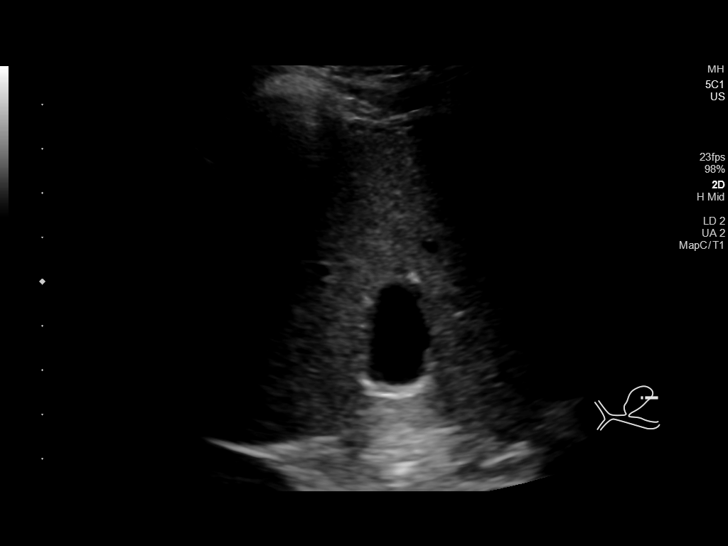
[im 55/147]
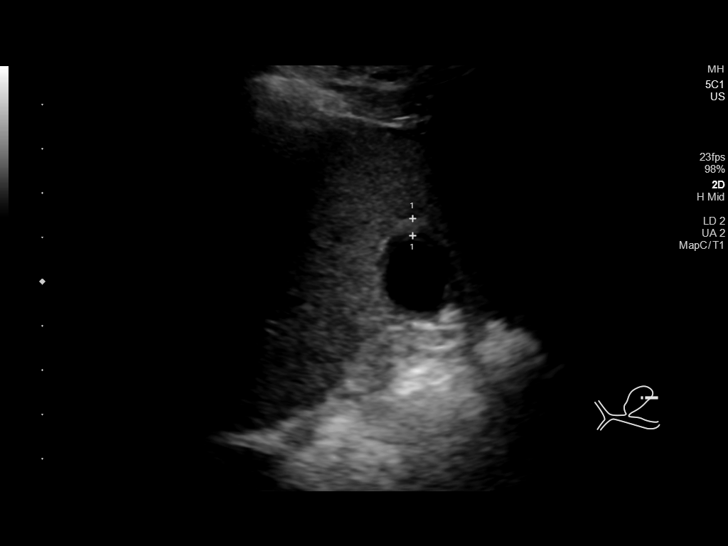
[im 67/147]
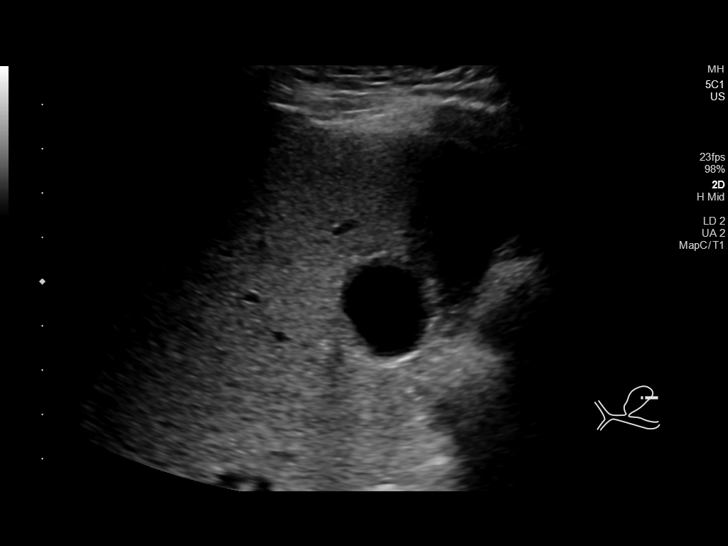
[im 80/147]
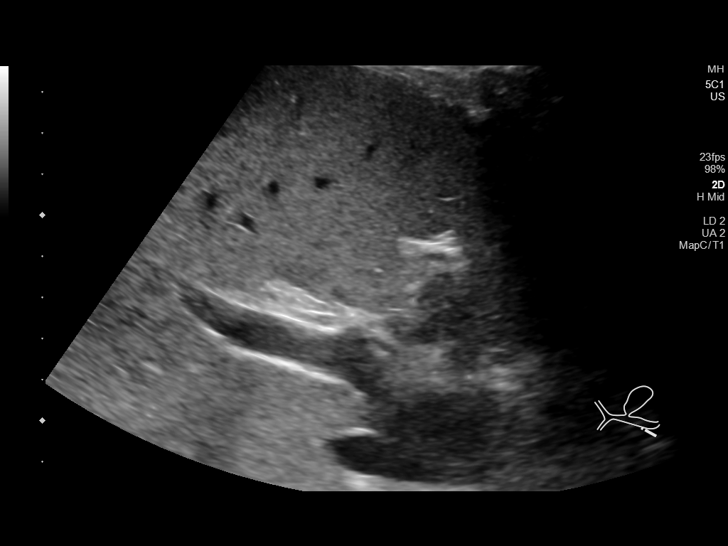
[im 92/147]
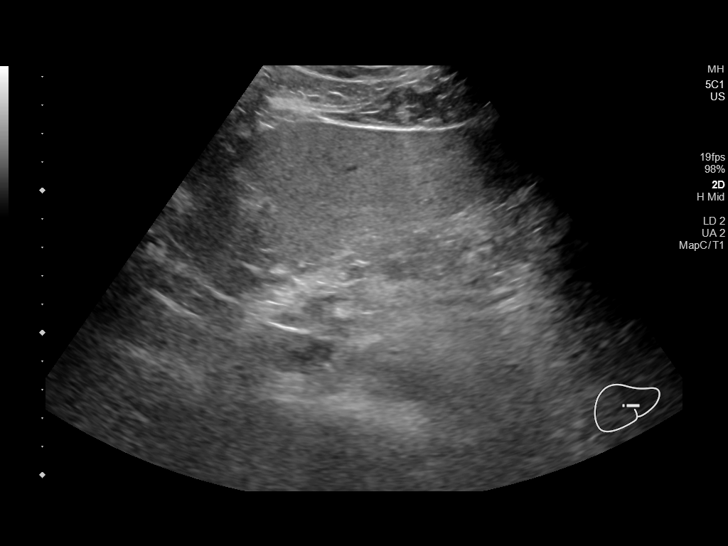
[im 98/147]
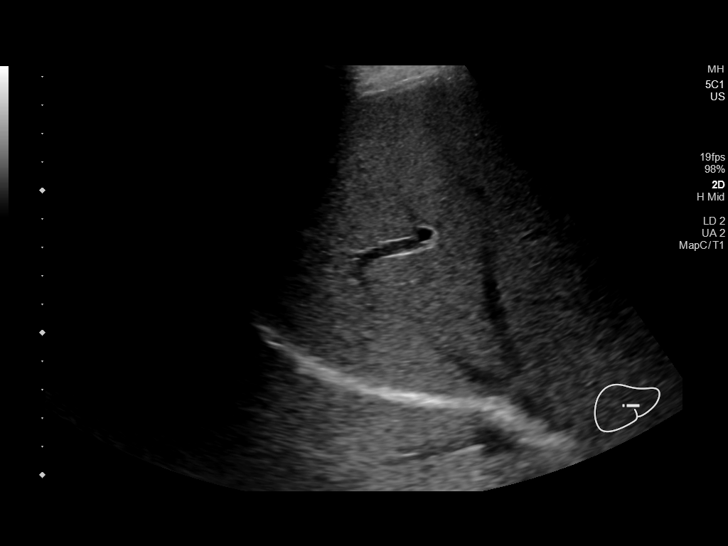
[im 110/147]
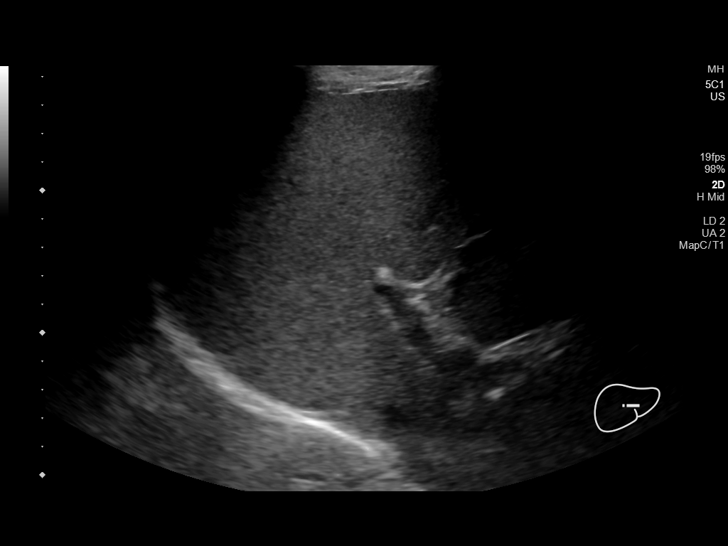
[im 122/147]
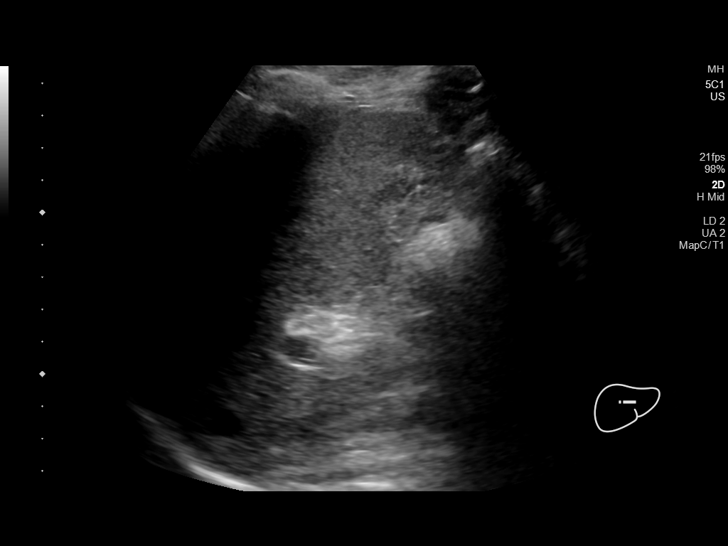
[im 134/147]
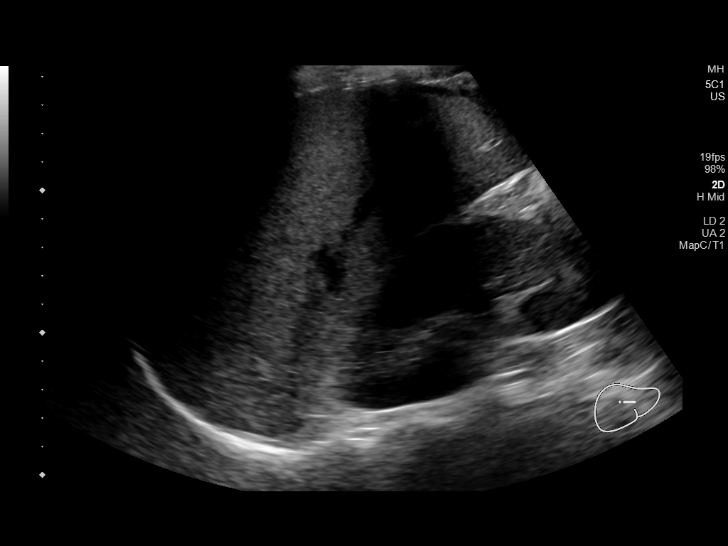
[im 147/147]
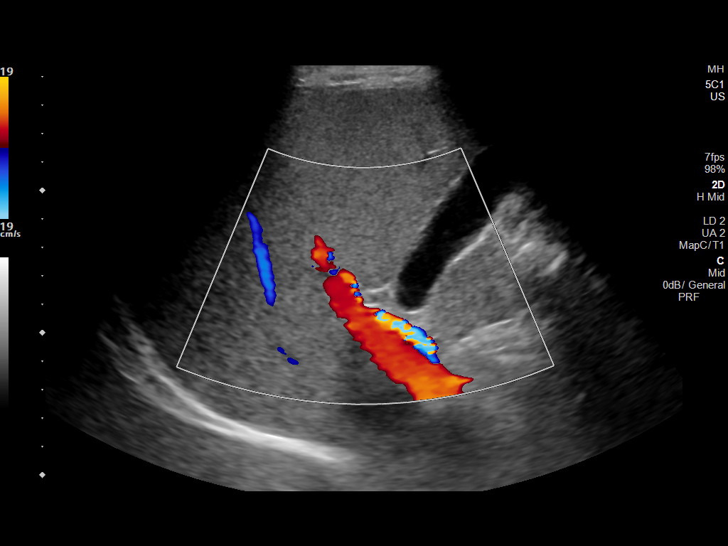

[14 of 25 positions shown; findings below may reference images not displayed]

FINDINGS: Gallbladder:

No gallstones or wall thickening visualized. No sonographic Murphy
sign noted by sonographer.

Sludge ball in the neck of the gallbladder.

Common bile duct:

Diameter: 2.8 mm, normal

Liver:

No focal lesion identified. Slight increased echogenicity of the
liver parenchyma which could represent hepatic steatosis. Portal
vein is patent on color Doppler imaging with normal direction of
blood flow towards the liver.

Other: None.
IMPRESSION: No acute abnormalities. Slight increased echogenicity of the liver
parenchyma consistent with hepatic steatosis.
